# Patient Record
Sex: Male | Born: 1945 | Race: White | Hispanic: No | Marital: Single | Smoking: Current every day smoker
Health system: Southern US, Community
[De-identification: ages and names within clinical notes are randomized; demographics above are authoritative.]

## PROBLEM LIST (undated history)

## (undated) DIAGNOSIS — I2510 Atherosclerotic heart disease of native coronary artery without angina pectoris: Secondary | ICD-10-CM

## (undated) DIAGNOSIS — I5022 Chronic systolic (congestive) heart failure: Secondary | ICD-10-CM

## (undated) DIAGNOSIS — E785 Hyperlipidemia, unspecified: Secondary | ICD-10-CM

## (undated) DIAGNOSIS — K5792 Diverticulitis of intestine, part unspecified, without perforation or abscess without bleeding: Secondary | ICD-10-CM

## (undated) DIAGNOSIS — I8392 Asymptomatic varicose veins of left lower extremity: Secondary | ICD-10-CM

## (undated) DIAGNOSIS — J449 Chronic obstructive pulmonary disease, unspecified: Secondary | ICD-10-CM

## (undated) DIAGNOSIS — C679 Malignant neoplasm of bladder, unspecified: Secondary | ICD-10-CM

## (undated) DIAGNOSIS — I2699 Other pulmonary embolism without acute cor pulmonale: Secondary | ICD-10-CM

## (undated) DIAGNOSIS — D696 Thrombocytopenia, unspecified: Secondary | ICD-10-CM

## (undated) DIAGNOSIS — Z720 Tobacco use: Secondary | ICD-10-CM

## (undated) DIAGNOSIS — I10 Essential (primary) hypertension: Secondary | ICD-10-CM

## (undated) DIAGNOSIS — G43909 Migraine, unspecified, not intractable, without status migrainosus: Secondary | ICD-10-CM

## (undated) DIAGNOSIS — F32A Depression, unspecified: Secondary | ICD-10-CM

## (undated) DIAGNOSIS — I219 Acute myocardial infarction, unspecified: Secondary | ICD-10-CM

## (undated) DIAGNOSIS — I255 Ischemic cardiomyopathy: Secondary | ICD-10-CM

## (undated) DIAGNOSIS — Z95810 Presence of automatic (implantable) cardiac defibrillator: Secondary | ICD-10-CM

## (undated) DIAGNOSIS — I82409 Acute embolism and thrombosis of unspecified deep veins of unspecified lower extremity: Secondary | ICD-10-CM

## (undated) HISTORY — PX: MOHS SURGERY: SUR867

## (undated) HISTORY — PX: PERCUTANEOUS CORONARY STENT INTERVENTION (PCI-S): SHX6016

## (undated) HISTORY — PX: CARDIAC DEFIBRILLATOR PLACEMENT: SHX171

## (undated) HISTORY — PX: VASECTOMY: SHX75

## (undated) HISTORY — PX: VASECTOMY REVERSAL: SHX243

---

## 2018-03-03 DIAGNOSIS — I2699 Other pulmonary embolism without acute cor pulmonale: Secondary | ICD-10-CM | POA: Insufficient documentation

## 2018-03-11 DIAGNOSIS — Z8551 Personal history of malignant neoplasm of bladder: Secondary | ICD-10-CM | POA: Insufficient documentation

## 2018-04-11 DIAGNOSIS — F17200 Nicotine dependence, unspecified, uncomplicated: Secondary | ICD-10-CM

## 2019-12-28 DIAGNOSIS — C4492 Squamous cell carcinoma of skin, unspecified: Secondary | ICD-10-CM

## 2019-12-28 HISTORY — DX: Squamous cell carcinoma of skin, unspecified: C44.92

## 2021-01-06 DIAGNOSIS — J441 Chronic obstructive pulmonary disease with (acute) exacerbation: Secondary | ICD-10-CM | POA: Diagnosis present

## 2021-01-06 DIAGNOSIS — J309 Allergic rhinitis, unspecified: Secondary | ICD-10-CM | POA: Insufficient documentation

## 2021-01-06 DIAGNOSIS — I5022 Chronic systolic (congestive) heart failure: Secondary | ICD-10-CM | POA: Diagnosis present

## 2021-01-06 DIAGNOSIS — I255 Ischemic cardiomyopathy: Secondary | ICD-10-CM | POA: Insufficient documentation

## 2021-01-06 DIAGNOSIS — E785 Hyperlipidemia, unspecified: Secondary | ICD-10-CM

## 2021-01-06 DIAGNOSIS — I2510 Atherosclerotic heart disease of native coronary artery without angina pectoris: Secondary | ICD-10-CM | POA: Diagnosis present

## 2021-01-06 DIAGNOSIS — I5023 Acute on chronic systolic (congestive) heart failure: Secondary | ICD-10-CM

## 2021-01-06 DIAGNOSIS — E119 Type 2 diabetes mellitus without complications: Secondary | ICD-10-CM | POA: Insufficient documentation

## 2021-01-06 DIAGNOSIS — I10 Essential (primary) hypertension: Secondary | ICD-10-CM

## 2021-10-12 ENCOUNTER — Emergency Department: Payer: Medicare Other

## 2021-10-12 ENCOUNTER — Inpatient Hospital Stay
Admission: EM | Admit: 2021-10-12 | Discharge: 2021-10-13 | DRG: 280 | Disposition: A | Discharge status: Other Acute Inpatient Hospital | Payer: Medicare Other | Attending: Internal Medicine | Admitting: Internal Medicine

## 2021-10-12 ENCOUNTER — Other Ambulatory Visit: Payer: Self-pay

## 2021-10-12 DIAGNOSIS — Z823 Family history of stroke: Secondary | ICD-10-CM

## 2021-10-12 DIAGNOSIS — I252 Old myocardial infarction: Secondary | ICD-10-CM

## 2021-10-12 DIAGNOSIS — Z20822 Contact with and (suspected) exposure to covid-19: Secondary | ICD-10-CM | POA: Diagnosis present

## 2021-10-12 DIAGNOSIS — F17210 Nicotine dependence, cigarettes, uncomplicated: Secondary | ICD-10-CM | POA: Diagnosis present

## 2021-10-12 DIAGNOSIS — J9601 Acute respiratory failure with hypoxia: Secondary | ICD-10-CM | POA: Diagnosis present

## 2021-10-12 DIAGNOSIS — K625 Hemorrhage of anus and rectum: Secondary | ICD-10-CM | POA: Diagnosis present

## 2021-10-12 DIAGNOSIS — Z8052 Family history of malignant neoplasm of bladder: Secondary | ICD-10-CM

## 2021-10-12 DIAGNOSIS — Z86718 Personal history of other venous thrombosis and embolism: Secondary | ICD-10-CM

## 2021-10-12 DIAGNOSIS — I110 Hypertensive heart disease with heart failure: Secondary | ICD-10-CM | POA: Diagnosis present

## 2021-10-12 DIAGNOSIS — Z8674 Personal history of sudden cardiac arrest: Secondary | ICD-10-CM

## 2021-10-12 DIAGNOSIS — I10 Essential (primary) hypertension: Secondary | ICD-10-CM

## 2021-10-12 DIAGNOSIS — Z6835 Body mass index (BMI) 35.0-35.9, adult: Secondary | ICD-10-CM

## 2021-10-12 DIAGNOSIS — I5023 Acute on chronic systolic (congestive) heart failure: Secondary | ICD-10-CM | POA: Diagnosis present

## 2021-10-12 DIAGNOSIS — Z9221 Personal history of antineoplastic chemotherapy: Secondary | ICD-10-CM

## 2021-10-12 DIAGNOSIS — Z7901 Long term (current) use of anticoagulants: Secondary | ICD-10-CM

## 2021-10-12 DIAGNOSIS — D751 Secondary polycythemia: Secondary | ICD-10-CM

## 2021-10-12 DIAGNOSIS — E669 Obesity, unspecified: Secondary | ICD-10-CM | POA: Diagnosis present

## 2021-10-12 DIAGNOSIS — Z95810 Presence of automatic (implantable) cardiac defibrillator: Secondary | ICD-10-CM

## 2021-10-12 DIAGNOSIS — J441 Chronic obstructive pulmonary disease with (acute) exacerbation: Secondary | ICD-10-CM | POA: Diagnosis present

## 2021-10-12 DIAGNOSIS — Z9981 Dependence on supplemental oxygen: Secondary | ICD-10-CM

## 2021-10-12 DIAGNOSIS — Z8249 Family history of ischemic heart disease and other diseases of the circulatory system: Secondary | ICD-10-CM

## 2021-10-12 DIAGNOSIS — Z8551 Personal history of malignant neoplasm of bladder: Secondary | ICD-10-CM

## 2021-10-12 DIAGNOSIS — R0602 Shortness of breath: Secondary | ICD-10-CM | POA: Diagnosis not present

## 2021-10-12 DIAGNOSIS — I214 Non-ST elevation (NSTEMI) myocardial infarction: Principal | ICD-10-CM | POA: Diagnosis present

## 2021-10-12 DIAGNOSIS — F17200 Nicotine dependence, unspecified, uncomplicated: Secondary | ICD-10-CM

## 2021-10-12 DIAGNOSIS — D696 Thrombocytopenia, unspecified: Secondary | ICD-10-CM | POA: Diagnosis present

## 2021-10-12 DIAGNOSIS — R739 Hyperglycemia, unspecified: Secondary | ICD-10-CM | POA: Diagnosis present

## 2021-10-12 DIAGNOSIS — Z923 Personal history of irradiation: Secondary | ICD-10-CM

## 2021-10-12 DIAGNOSIS — E785 Hyperlipidemia, unspecified: Secondary | ICD-10-CM | POA: Diagnosis present

## 2021-10-12 DIAGNOSIS — Z955 Presence of coronary angioplasty implant and graft: Secondary | ICD-10-CM

## 2021-10-12 DIAGNOSIS — Z86711 Personal history of pulmonary embolism: Secondary | ICD-10-CM | POA: Diagnosis present

## 2021-10-12 DIAGNOSIS — I5021 Acute systolic (congestive) heart failure: Secondary | ICD-10-CM

## 2021-10-12 DIAGNOSIS — I255 Ischemic cardiomyopathy: Secondary | ICD-10-CM | POA: Diagnosis present

## 2021-10-12 DIAGNOSIS — I2510 Atherosclerotic heart disease of native coronary artery without angina pectoris: Secondary | ICD-10-CM | POA: Diagnosis present

## 2021-10-12 HISTORY — DX: Presence of automatic (implantable) cardiac defibrillator: Z95.810

## 2021-10-12 HISTORY — DX: Chronic obstructive pulmonary disease, unspecified: J44.9

## 2021-10-12 HISTORY — DX: Tobacco use: Z72.0

## 2021-10-12 HISTORY — DX: Ischemic cardiomyopathy: I25.5

## 2021-10-12 HISTORY — DX: Acute myocardial infarction, unspecified: I21.9

## 2021-10-12 HISTORY — DX: Atherosclerotic heart disease of native coronary artery without angina pectoris: I25.10

## 2021-10-12 HISTORY — DX: Depression, unspecified: F32.A

## 2021-10-12 HISTORY — DX: Diverticulitis of intestine, part unspecified, without perforation or abscess without bleeding: K57.92

## 2021-10-12 HISTORY — DX: Hyperlipidemia, unspecified: E78.5

## 2021-10-12 HISTORY — DX: Malignant neoplasm of bladder, unspecified: C67.9

## 2021-10-12 HISTORY — DX: Other pulmonary embolism without acute cor pulmonale: I26.99

## 2021-10-12 HISTORY — DX: Essential (primary) hypertension: I10

## 2021-10-12 HISTORY — DX: Acute embolism and thrombosis of unspecified deep veins of unspecified lower extremity: I82.409

## 2021-10-12 HISTORY — DX: Asymptomatic varicose veins of left lower extremity: I83.92

## 2021-10-12 HISTORY — DX: Morbid (severe) obesity due to excess calories: E66.01

## 2021-10-12 HISTORY — DX: Chronic systolic (congestive) heart failure: I50.22

## 2021-10-12 HISTORY — DX: Thrombocytopenia, unspecified: D69.6

## 2021-10-12 HISTORY — DX: Migraine, unspecified, not intractable, without status migrainosus: G43.909

## 2021-10-12 LAB — CBC WITH DIFFERENTIAL/PLATELET
Abs Immature Granulocytes: 0.02 10*3/uL (ref 0.00–0.07)
Basophils Absolute: 0 10*3/uL (ref 0.0–0.1)
Basophils Relative: 0 %
Eosinophils Absolute: 0.1 10*3/uL (ref 0.0–0.5)
Eosinophils Relative: 1 %
HCT: 54.8 % — ABNORMAL HIGH (ref 39.0–52.0)
Hemoglobin: 18.5 g/dL — ABNORMAL HIGH (ref 13.0–17.0)
Immature Granulocytes: 0 %
Lymphocytes Relative: 12 %
Lymphs Abs: 1.1 10*3/uL (ref 0.7–4.0)
MCH: 31.1 pg (ref 26.0–34.0)
MCHC: 33.8 g/dL (ref 30.0–36.0)
MCV: 92.3 fL (ref 80.0–100.0)
Monocytes Absolute: 0.9 10*3/uL (ref 0.1–1.0)
Monocytes Relative: 10 %
Neutro Abs: 7 10*3/uL (ref 1.7–7.7)
Neutrophils Relative %: 77 %
Platelets: 110 10*3/uL — ABNORMAL LOW (ref 150–400)
RBC: 5.94 MIL/uL — ABNORMAL HIGH (ref 4.22–5.81)
RDW: 12.2 % (ref 11.5–15.5)
WBC: 9.1 10*3/uL (ref 4.0–10.5)
nRBC: 0 % (ref 0.0–0.2)

## 2021-10-12 LAB — PROTIME-INR
INR: 1.1 (ref 0.8–1.2)
Prothrombin Time: 14.4 seconds (ref 11.4–15.2)

## 2021-10-12 LAB — COMPREHENSIVE METABOLIC PANEL
ALT: 23 U/L (ref 0–44)
AST: 59 U/L — ABNORMAL HIGH (ref 15–41)
Albumin: 3.6 g/dL (ref 3.5–5.0)
Alkaline Phosphatase: 76 U/L (ref 38–126)
Anion gap: 8 (ref 5–15)
BUN: 15 mg/dL (ref 8–23)
CO2: 30 mmol/L (ref 22–32)
Calcium: 9.3 mg/dL (ref 8.9–10.3)
Chloride: 101 mmol/L (ref 98–111)
Creatinine, Ser: 1.09 mg/dL (ref 0.61–1.24)
GFR, Estimated: >60 mL/min (ref >60)
Glucose, Bld: 190 mg/dL — ABNORMAL HIGH (ref 70–99)
Potassium: 3.5 mmol/L (ref 3.5–5.1)
Sodium: 139 mmol/L (ref 135–145)
Total Bilirubin: 2.7 mg/dL — ABNORMAL HIGH (ref 0.3–1.2)
Total Protein: 7.2 g/dL (ref 6.5–8.1)

## 2021-10-12 LAB — TROPONIN I (HIGH SENSITIVITY)
Troponin I (High Sensitivity): 5778 ng/L (ref <18)
Troponin I (High Sensitivity): 5795 ng/L (ref <18)

## 2021-10-12 LAB — HEPARIN LEVEL (UNFRACTIONATED): Heparin Unfractionated: 1.09 IU/mL — ABNORMAL HIGH (ref 0.30–0.70)

## 2021-10-12 LAB — RESP PANEL BY RT-PCR (FLU A&B, COVID) ARPGX2
Influenza A by PCR: NEGATIVE
Influenza B by PCR: NEGATIVE
SARS Coronavirus 2 by RT PCR: NEGATIVE

## 2021-10-12 LAB — BRAIN NATRIURETIC PEPTIDE: B Natriuretic Peptide: 431.7 pg/mL — ABNORMAL HIGH (ref 0.0–100.0)

## 2021-10-12 LAB — MAGNESIUM: Magnesium: 1.9 mg/dL (ref 1.7–2.4)

## 2021-10-12 LAB — APTT: aPTT: 27 seconds (ref 24–36)

## 2021-10-12 MED ORDER — MORPHINE SULFATE (PF) 2 MG/ML IV SOLN: 2.0000 mg | INTRAVENOUS | Status: DC | PRN

## 2021-10-12 MED ORDER — VITAMIN B-12 1000 MCG PO TABS
2500.0000 ug | ORAL_TABLET | Freq: Every day | ORAL | Status: DC
  Filled 2021-10-12: qty 2.5

## 2021-10-12 MED ORDER — LISINOPRIL 10 MG PO TABS
10.0000 mg | ORAL_TABLET | Freq: Every day | ORAL | Status: DC
  Administered 2021-10-13: 10 mg via ORAL
  Filled 2021-10-12: qty 1

## 2021-10-12 MED ORDER — METHYLPREDNISOLONE SODIUM SUCC 40 MG IJ SOLR: 40.0000 mg | Freq: Every day | INTRAMUSCULAR | Status: DC

## 2021-10-12 MED ORDER — FLUTICASONE FUROATE-VILANTEROL 100-25 MCG/ACT IN AEPB
1.0000 | INHALATION_SPRAY | Freq: Every morning | RESPIRATORY_TRACT | Status: DC
  Filled 2021-10-12: qty 28

## 2021-10-12 MED ORDER — METHYLPREDNISOLONE SODIUM SUCC 125 MG IJ SOLR
125.0000 mg | Freq: Once | INTRAMUSCULAR | Status: AC
  Administered 2021-10-12: 17:00:00 125 mg via INTRAVENOUS
  Filled 2021-10-12: qty 2

## 2021-10-12 MED ORDER — IPRATROPIUM-ALBUTEROL 0.5-2.5 (3) MG/3ML IN SOLN: 3.0000 mL | Freq: Three times a day (TID) | RESPIRATORY_TRACT | Status: DC

## 2021-10-12 MED ORDER — ASPIRIN 81 MG PO CHEW
324.0000 mg | CHEWABLE_TABLET | Freq: Once | ORAL | Status: AC
  Administered 2021-10-12: 17:00:00 324 mg via ORAL
  Filled 2021-10-12: qty 4

## 2021-10-12 MED ORDER — ONDANSETRON HCL 4 MG PO TABS: 4.0000 mg | ORAL_TABLET | Freq: Four times a day (QID) | ORAL | Status: DC | PRN

## 2021-10-12 MED ORDER — ENOXAPARIN SODIUM 60 MG/0.6ML IJ SOSY: 0.5000 mg/kg | PREFILLED_SYRINGE | INTRAMUSCULAR | Status: DC

## 2021-10-12 MED ORDER — APIXABAN 5 MG PO TABS: 5.0000 mg | ORAL_TABLET | Freq: Two times a day (BID) | ORAL | Status: DC

## 2021-10-12 MED ORDER — NITROGLYCERIN 0.4 MG SL SUBL
0.4000 mg | SUBLINGUAL_TABLET | SUBLINGUAL | Status: DC | PRN
  Administered 2021-10-12: 18:00:00 0.4 mg via SUBLINGUAL
  Filled 2021-10-12: qty 1

## 2021-10-12 MED ORDER — ACETAMINOPHEN 325 MG RE SUPP: 650.0000 mg | Freq: Four times a day (QID) | RECTAL | Status: DC | PRN

## 2021-10-12 MED ORDER — IPRATROPIUM-ALBUTEROL 0.5-2.5 (3) MG/3ML IN SOLN
3.0000 mL | Freq: Three times a day (TID) | RESPIRATORY_TRACT | Status: DC
  Administered 2021-10-12: 22:00:00 3 mL via RESPIRATORY_TRACT
  Filled 2021-10-12 (×2): qty 3

## 2021-10-12 MED ORDER — IPRATROPIUM-ALBUTEROL 0.5-2.5 (3) MG/3ML IN SOLN
3.0000 mL | Freq: Once | RESPIRATORY_TRACT | Status: AC
  Administered 2021-10-12: 17:00:00 3 mL via RESPIRATORY_TRACT
  Filled 2021-10-12: qty 3

## 2021-10-12 MED ORDER — ADULT MULTIVITAMIN W/MINERALS CH
1.0000 | ORAL_TABLET | Freq: Every day | ORAL | Status: DC
  Administered 2021-10-13: 1 via ORAL
  Filled 2021-10-12: qty 1

## 2021-10-12 MED ORDER — HEPARIN (PORCINE) 25000 UT/250ML-% IV SOLN
1550.0000 [IU]/h | INTRAVENOUS | Status: DC
  Administered 2021-10-12: 21:00:00 1300 [IU]/h via INTRAVENOUS
  Filled 2021-10-12 (×2): qty 250

## 2021-10-12 MED ORDER — PRAVASTATIN SODIUM 20 MG PO TABS: 80.0000 mg | ORAL_TABLET | Freq: Every day | ORAL | Status: DC

## 2021-10-12 MED ORDER — FLUTICASONE PROPIONATE 50 MCG/ACT NA SUSP
2.0000 | Freq: Every day | NASAL | Status: DC | PRN
  Filled 2021-10-12: qty 16

## 2021-10-12 MED ORDER — ALBUTEROL SULFATE (2.5 MG/3ML) 0.083% IN NEBU: 2.5000 mg | INHALATION_SOLUTION | Freq: Four times a day (QID) | RESPIRATORY_TRACT | Status: DC | PRN

## 2021-10-12 MED ORDER — VITAMIN D3 25 MCG (1000 UNIT) PO TABS
5000.0000 [IU] | ORAL_TABLET | Freq: Every day | ORAL | Status: DC
  Filled 2021-10-12 (×2): qty 5

## 2021-10-12 MED ORDER — POLYETHYLENE GLYCOL 3350 17 G PO PACK: 17.0000 g | PACK | Freq: Every day | ORAL | Status: DC | PRN

## 2021-10-12 MED ORDER — ONDANSETRON HCL 4 MG/2ML IJ SOLN: 4.0000 mg | Freq: Four times a day (QID) | INTRAMUSCULAR | Status: DC | PRN

## 2021-10-12 MED ORDER — CARVEDILOL 6.25 MG PO TABS
12.5000 mg | ORAL_TABLET | Freq: Two times a day (BID) | ORAL | Status: DC
  Administered 2021-10-12 – 2021-10-13 (×2): 12.5 mg via ORAL
  Filled 2021-10-12 (×2): qty 2

## 2021-10-12 MED ORDER — ACETAMINOPHEN 325 MG PO TABS: 650.0000 mg | ORAL_TABLET | Freq: Four times a day (QID) | ORAL | Status: DC | PRN

## 2021-10-12 MED ORDER — HEPARIN BOLUS VIA INFUSION
4000.0000 [IU] | Freq: Once | INTRAVENOUS | Status: AC
  Administered 2021-10-12: 21:00:00 4000 [IU] via INTRAVENOUS
  Filled 2021-10-12: qty 4000

## 2021-10-12 NOTE — Assessment & Plan Note (Signed)
-   Check high sensitive troponin ?

## 2021-10-12 NOTE — Consult Note (Addendum)
ANTICOAGULATION CONSULT NOTE - Initial Consult ? ?Pharmacy Consult for heparin ?Indication: chest pain/ACS ? ?No Known Allergies ? ?Patient Measurements: ?Height: '5\' 10"'$  (177.8 cm) ?Weight: 112 kg (247 lb) ?IBW/kg (Calculated) : 73 ?Heparin Dosing Weight: 97.2 kg ? ?Vital Signs: ?Temp: 97.9 ?F (36.6 ?C) (03/09 1642) ?Temp Source: Oral (03/09 1642) ?BP: 109/69 (03/09 1900) ?Pulse Rate: 83 (03/09 1900) ? ?Labs: ?Recent Labs  ?  10/12/21 ?1707 10/12/21 ?1927  ?HGB 18.5*  --   ?HCT 54.8*  --   ?PLT 110*  --   ?CREATININE 1.09  --   ?TROPONINIHS  --  5,778*  ? ? ?Estimated Creatinine Clearance: 75.6 mL/min (by C-G formula based on SCr of 1.09 mg/dL). ? ? ?Medical History: ?Past Medical History:  ?Diagnosis Date  ? CHF (congestive heart failure) ([place])   ? COPD (chronic obstructive pulmonary disease) ([place])   ? MI (myocardial infarction) ([place])   ? ? ?Medications:  ?(Not in a hospital admission)  ?Scheduled:  ? carvedilol  12.5 mg Oral BID  ? [START ON 10/13/2021] cholecalciferol  5,000 Units Oral Daily  ? [START ON 10/13/2021] fluticasone furoate-vilanterol  1 puff Inhalation q AM  ? ipratropium-albuterol  3 mL Nebulization TID  ? [START ON 10/13/2021] lisinopril  10 mg Oral Daily  ? [START ON 10/13/2021] methylPREDNISolone (SOLU-MEDROL) injection  40 mg Intravenous Daily  ? [START ON 10/13/2021] multivitamin with minerals  1 tablet Oral Daily  ? [START ON 10/13/2021] pravastatin  80 mg Oral q1800  ? [START ON 10/13/2021] vitamin B-12  2,500 mcg Oral Daily  ? ?Infusions:  ?PRN: acetaminophen **OR** acetaminophen, albuterol, fluticasone, morphine injection, nitroGLYCERIN, ondansetron **OR** ondansetron (ZOFRAN) IV, polyethylene glycol ?Anti-infectives (From admission, onward)  ? ? None  ? ?  ? ? ?Assessment: ?Pharmacy consulted to start heparin for ACS. Trop elevated 5.7K. Pt takes apixaban PTA, unsure of the indication. Baseline labs ordered. Will use aPTT to monitor heparin if anti-xa level is falsely elevated. Last dose of  apixaban was 10/12/21 AM ? ?Goal of Therapy:  ?aPTT 66-102 seconds ?Heparin level 0.3-0.7 units/ml once aPTT and heparin level correlate.  ?Monitor platelets by anticoagulation protocol: Yes ?  ?Plan:  ?Give 4000 units bolus x 1 ?Start heparin infusion at 1300 units/hr ?Check aPTT level in 8 hours and heparin level daily while on heparin.  ?Continue to monitor H&H and platelets ? ?Oswald Hillock, PharmD, BCPS ?10/12/2021,8:30 PM ? ? ?

## 2021-10-12 NOTE — ED Notes (Signed)
Pt reports feeling a little better after nitro. Pt now rates his pain 3/10. ?

## 2021-10-12 NOTE — Assessment & Plan Note (Addendum)
-   Status post PCI in 2002 (he states that he had an MI, coded 2 times, was sedated and intubated and had stents placed) ?- He has AICD placement in [place], he was not able to recall the year but thinks it was greater than 5 years ago ?- He had an echo done in [place] state in November 2022, he states that he will try to obtain the result and reading ?- I have not ordered complete echo at this time due to patient stating he had a complete echo in November 2022 ?-Resumed home carvedilol 12.5 mg p.o. twice daily, lisinopril 10 mg daily, cholesterol medication alternative with pravastatin 80 mg nightly ?

## 2021-10-12 NOTE — ED Provider Notes (Signed)
? ?[facility] ?Provider Note ? ? Event Date/Time  ? First MD Initiated Contact with Patient 10/12/21 1643   ?  (approximate) ?History  ?Shortness of Breath ? ?HPI ?Paul Bullock is a 76 y.o. male with a stated past medical history of COPD, CHF, ACS who presents for chest tightness with associated shortness of breath that is worsening over the last week and significantly worsening over the last 30 minutes.  Patient describes this chest pain as 7/10, tight, and radiating from the left side of his chest to the right.  Patient states that similar symptoms have happened to him when he has had COPD exacerbations in the past.  Patient states that he is not using oxygen chronically but has needed 3 L of oxygen since triage ?Physical Exam  ?Triage Vital Signs: ?ED Triage Vitals  ?Enc Vitals Group  ?   BP 10/12/21 1642 (!) 159/85  ?   Pulse Rate 10/12/21 1638 (!) 107  ?   Resp 10/12/21 1638 (!) 26  ?   Temp 10/12/21 1642 97.9 ?F (36.6 ?C)  ?   Temp Source 10/12/21 1642 Oral  ?   SpO2 10/12/21 1638 94 %  ?   Weight 10/12/21 1639 247 lb (112 kg)  ?   Height 10/12/21 1639 '5\' 10"'$  (1.778 m)  ?   Head Circumference --   ?   Peak Flow --   ?   Pain Score 10/12/21 1638 7  ?   Pain Loc --   ?   Pain Edu? --   ?   Excl. in [place]? --   ? ?Most recent vital signs: ?Vitals:  ? 10/12/21 1638 10/12/21 1642  ?BP:  (!) 159/85  ?Pulse: (!) 107   ?Resp: (!) 26   ?Temp:  97.9 ?F (36.6 ?C)  ?SpO2: 94%   ? ?General: Awake, oriented x4. ?CV:  Good peripheral perfusion.  ?Resp:  Increased Effort.  Normal expiratory wheezes over bilateral lung fields.  ?Abd:  No distention.  ?Other:  Caucasian overweight male laying in bed in moderate respiratory distress ?ED Results / Procedures / Treatments  ?Labs ?(all labs ordered are listed, but only abnormal results are displayed) ?Labs Reviewed  ?COMPREHENSIVE METABOLIC PANEL - Abnormal; Notable for the following components:  ?    Result Value  ? Glucose, Bld 190 (*)   ? AST 59 (*)   ? Total  Bilirubin 2.7 (*)   ? All other components within normal limits  ?BRAIN NATRIURETIC PEPTIDE - Abnormal; Notable for the following components:  ? B Natriuretic Peptide 431.7 (*)   ? All other components within normal limits  ?CBC WITH DIFFERENTIAL/PLATELET - Abnormal; Notable for the following components:  ? RBC 5.94 (*)   ? Hemoglobin 18.5 (*)   ? HCT 54.8 (*)   ? Platelets 110 (*)   ? All other components within normal limits  ?RESP PANEL BY RT-PCR (FLU A&B, COVID) [id number]  ?BASIC METABOLIC PANEL  ?CBC  ?MAGNESIUM  ?TROPONIN I (HIGH SENSITIVITY)  ? ?EKG ?ED ECG REPORT ?I, Naaman Plummer, the attending physician, personally viewed and interpreted this ECG. ?Date: 10/12/2021 ?EKG Time: 1802 ?Rate: 82 ?Rhythm: normal sinus rhythm ?QRS Axis: normal ?Intervals: normal ?ST/T Wave abnormalities: normal ?Narrative Interpretation: no evidence of acute ischemia ?RADIOLOGY ?ED MD interpretation: Single view portable chest x-ray as interpreted by me shows diffuse peribronchial thickening and interstitial coarsening ?-Agree with radiology assessment ?Official radiology report(s): ?DG Chest Port 1 View ? ?Result Date: 10/12/2021 ?CLINICAL  DATA:  Shortness of breath.  History of COPD. EXAM: PORTABLE CHEST 1 VIEW COMPARISON:  None available. FINDINGS: Dual lead left-sided pacemaker in place. The heart is enlarged. Aortic atherosclerosis. There is diffuse peribronchial thickening and interstitial coarsening. Limited left basilar assessment due to soft tissue attenuation from habitus. No confluent airspace disease. No large pleural effusion. No pneumothorax. IMPRESSION: Cardiomegaly. Diffuse peribronchial thickening and interstitial coarsening, may represent pulmonary edema, acute bronchitis, or chronic lung disease. No prior exams available for comparison. Aortic Atherosclerosis (ICD10-I70.0). Electronically Signed   By: Keith Rake M.D.   On: 10/12/2021 17:39   ?PROCEDURES: ?Critical Care performed: Yes, see critical care  procedure note(s) ?.1-3 Lead EKG Interpretation ?Performed by: Naaman Plummer, MD ?Authorized by: Naaman Plummer, MD  ? ?  Interpretation: abnormal   ?  ECG rate:  108 ?  ECG rate assessment: tachycardic   ?  Rhythm: sinus tachycardia   ?  Ectopy: none   ?  Conduction: normal   ?CRITICAL CARE ?Performed by: Naaman Plummer ? ? ?Total critical care time: 35 minutes ? ?Critical care time was exclusive of separately billable procedures and treating other patients. ? ?Critical care was necessary to treat or prevent imminent or life-threatening deterioration. ? ?Critical care was time spent personally by me on the following activities: development of treatment plan with patient and/or surrogate as well as nursing, discussions with consultants, evaluation of patient's response to treatment, examination of patient, obtaining history from patient or surrogate, ordering and performing treatments and interventions, ordering and review of laboratory studies, ordering and review of radiographic studies, pulse oximetry and re-evaluation of patient's condition. ? ?MEDICATIONS ORDERED IN ED: ?Medications  ?nitroGLYCERIN (NITROSTAT) SL tablet 0.4 mg (0.4 mg Sublingual Given 10/12/21 1806)  ?acetaminophen (TYLENOL) tablet 650 mg (has no administration in time range)  ?  Or  ?acetaminophen (TYLENOL) suppository 650 mg (has no administration in time range)  ?ondansetron (ZOFRAN) tablet 4 mg (has no administration in time range)  ?  Or  ?ondansetron (ZOFRAN) injection 4 mg (has no administration in time range)  ?enoxaparin (LOVENOX) injection 55 mg (has no administration in time range)  ?polyethylene glycol (MIRALAX / GLYCOLAX) packet 17 g (has no administration in time range)  ?ipratropium-albuterol (DUONEB) 0.5-2.5 (3) MG/3ML nebulizer solution 3 mL (has no administration in time range)  ?methylPREDNISolone sodium succinate (SOLU-MEDROL) 40 mg/mL injection 40 mg (has no administration in time range)  ?morphine (PF) 2 MG/ML injection 2  mg (has no administration in time range)  ?ipratropium-albuterol (DUONEB) 0.5-2.5 (3) MG/3ML nebulizer solution 3 mL (3 mLs Nebulization Given 10/12/21 1716)  ?methylPREDNISolone sodium succinate (SOLU-MEDROL) 125 mg/2 mL injection 125 mg (125 mg Intravenous Given 10/12/21 1713)  ?aspirin chewable tablet 324 mg (324 mg Oral Given 10/12/21 1711)  ? ?IMPRESSION / MDM / ASSESSMENT AND PLAN / ED COURSE  ?I reviewed the triage vital signs and the nursing notes. ?             ?               ?The patient is on the cardiac monitor to evaluate for evidence of arrhythmia and/or significant heart rate changes. ?The patient appears to be suffering from a moderate/severe exacerbation of COPD. ? ?Based on the history, exam, CXR/EKG reviewed by me, and further workup I don?t suspect any other emergent cause of this presentation, such as pneumonia, acute coronary syndrome, pulmonary embolism, or pneumothorax.  Patient may have small element of pulmonary edema however clinically  patient does not show signs of volume overload including no significant lower extremity edema or rales pulmonary exam ? ?ED Interventions: bronchodilators, steroids, reassess ? ?Reassessment: After treatment, the patient?s shortness of breath is improving but patient is still requiring supplemental oxygenation ? ?Disposition: Admit ? ?  ?FINAL CLINICAL IMPRESSION(S) / ED DIAGNOSES  ? ?Final diagnoses:  ?COPD exacerbation ([place])  ?SOB (shortness of breath)  ?Acute respiratory failure with hypoxia ([place])  ? ?Rx / DC Orders  ? ?ED Discharge Orders   ? ? None  ? ?  ? ?Note:  This document was prepared using Dragon voice recognition software and may include unintentional dictation errors. ?  ?Naaman Plummer, MD ?10/12/21 1920 ? ?

## 2021-10-12 NOTE — Assessment & Plan Note (Signed)
-   Patient has history of multiple PE ?- Patient is compliant with Eliquis ?- Eliquis has been held at this time due to NSTEMI ?

## 2021-10-12 NOTE — Assessment & Plan Note (Signed)
-   Patient had several episodes of rectal bleeding that bled into his pants ?- It has resolved at this time ?- Continue to monitor ?- We will continue with heparin GGT for NSTEMI in a patient with extensive history of coronary artery disease ?

## 2021-10-12 NOTE — H&P (Addendum)
History and Physical   Paul Bullock YPP:[id number] DOB: 02-14-1946 DOA: 10/12/2021  PCP: Pcp, No  Outpatient Specialists: None in [place], patient is currently looking for specialist to establish care Patient coming from: Home  I have personally briefly reviewed patient's old medical records in [place].  Chief Concern: Shortness of breath and chest pain  HPI: Mr. 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  Initial vitals in the emergency department showed temperature of 97.9, respiration rate of 26, heart rate of 107, blood pressure 159/85, SPO2 of 94% on room air.  Serum sodium 139, potassium 3.5, chloride 101, bicarb 30, BUN of 15, serum creatinine of 1.09, GFR greater than 60, nonfasting blood glucose 190, WBC 9.1, hemoglobin 18.5, platelets of 110.  BNP was 431.7.  COVID/influenza A/influenza B PCR were negative.  ED treatment: DuoNebs one-time dose, Solu-Medrol 125 mg, aspirin 324, nitroglycerin sublingual. ---------- At bedside, he is able to tell me his name, age, current calendar year.   He developed chest pressure, 7-8/10, like someone squeezing his chest, after leaving a restaurant. He had prime rib and rice pilaf and a side salad. He states that this felt simlar to prior heart attack 2002, with stents in his hear. 2013 he was diagnosed with bladder cancer. Currently the chest pressure is a 1/10 and barely preceived. He felt tired and lethagic.  He reports the chest pressure is similar to prior episodes of his heart attack in 2002.  He also states that he had AICD placed in [place], more than 5 years ago.  He does not remember the specific year as he moves around a lot.  He endorses compliance with Eliquis and his heart  medications.  Social history: He lives by himself at home. He smokes, less than one pack per day. He denies etoh and recreational drugs.  He is currently retired and formerly worked for the [employer] as a Engineer, drilling.  ROS: Constitutional: no weight change, no fever ENT/Mouth: no sore throat, no rhinorrhea Eyes: no eye pain, no vision changes Cardiovascular: + chest pain, + dyspnea,  no edema, no palpitations Respiratory: no cough, no sputum, no wheezing Gastrointestinal: no nausea, no vomiting, no diarrhea, no constipation Genitourinary: no urinary incontinence, no dysuria, no hematuria Musculoskeletal: no arthralgias, no myalgias Skin: no skin lesions, no pruritus, Neuro: no weakness, no loss of consciousness, no syncope Psych: no anxiety, no depression, + decrease appetite Heme/Lymph: no bruising, no bleeding  ED Course: Discussed with emergency medicine provider, patient requiring hospitalization for chief concerns of shortness of breath.  Assessment/Plan  Principal Problem:   Shortness of breath Active Problems:   COPD with acute exacerbation (HCC)   Elevated brain natriuretic peptide (BNP) level   NSTEMI (non-ST elevated myocardial infarction) (HCC)   CAD (coronary artery disease)   Chest pain   History of pulmonary embolus (PE)   Rectal bleeding   Cardiovascular and Mediastinum * Shortness of breath Assessment & Plan - Etiology work-up in progress differentials include heart failure exacerbation versus NSTEMI, low clinical suspicion for COPD at this time - Patient also endorsed improving shortness of breath after nitroglycerin sublingual - Strict I's and O's - Flutter valve and incentive spirometry  CAD (coronary artery disease) Assessment & Plan - Status post PCI in 2002 (he  states that he had an MI, coded 2 times, was sedated and intubated and had stents placed) - He has AICD placement in [place], he was not able to recall the year but thinks it was  greater than 5 years ago - He had an echo done in [place] state in November 2022, he states that he will try to obtain the result and reading - I have not ordered complete echo at this time due to patient stating he had a complete echo in November 2022 -Resumed home carvedilol 12.5 mg p.o. twice daily, lisinopril 10 mg daily, cholesterol medication alternative with pravastatin 80 mg nightly  NSTEMI (non-ST elevated myocardial infarction) [facility]) Assessment & Plan - Ordered heparin GGT - We will follow second high-sensitivity troponin - Cardiologist, Dr. Curt Bears has been consulted and states he will see the patient  Respiratory COPD with acute exacerbation ([place]) Assessment & Plan - DuoNebs 3 times daily scheduled, 3 doses ordered - Solu-Medrol 40 mg IV 1 dose ordered for 10/13/2021 - Incentive spirometry and flutter valve  Other History of pulmonary embolus (PE) Assessment & Plan - Patient has history of multiple PE - Patient is compliant with Eliquis - Eliquis has been held at this time due to NSTEMI  Chest pain Assessment & Plan - Relieved with nitroglycerin - Given patient's extensive history of cardiac disease, high sensitive troponin has been ordered - Nitroglycerin as needed ordered - If patient has recurrence of chest pain, we will order nitroglycerin gtt. if patient's blood pressure can tolerate  Elevated brain natriuretic peptide (BNP) level Assessment & Plan - Check high sensitive troponin  Rectal bleeding - Patient had several episodes of rectal bleeding that bled into his pants - Hemoglobin on admission is 18.5, repeat CBC in the a.m. - It has resolved at this time - Continue to monitor - We will continue with heparin GGT for NSTEMI in a patient with history of coronary artery disease  Chart reviewed.   DVT prophylaxis: Heparin GTT Code Status: Full code Diet: Heart healthy now, n.p.o. after midnight Family Communication:  Disposition Plan: Pending clinical  course, and cardiology evaluation Consults called: Cardiologist Admission status: Progressive cardiac, observation  Past Medical History:  Diagnosis Date   CHF (congestive heart failure) ([place])    COPD (chronic obstructive pulmonary disease) ([place])    MI (myocardial infarction) ([place])    Past Surgical History:  Procedure Laterality Date   PERCUTANEOUS CORONARY STENT INTERVENTION (PCI-S)     Social History:  reports that he has been smoking cigarettes. He has never used smokeless tobacco. He reports that he does not drink alcohol and does not use drugs.  No Known Allergies Family History  Problem Relation Age of Onset   Stroke Mother    Bladder Cancer Father    Heart attack Brother    Family history: Family history reviewed and pertinent for heart disease/heart attack in his brother  Prior to Admission medications   Carvedilol 12.5 mg p.o. twice daily, apixaban 5 mg twice daily, lisinopril 10 mg daily, pitavastatin 4 mg qhs   Physical Exam: Vitals:   10/12/21 1639 10/12/21 1642 10/12/21 1900 10/12/21 2114  BP:  (!) 159/85 109/69 122/77  Pulse:   83 91  Resp:   (!) 25   Temp:  97.9 F (36.6 C)    TempSrc:  Oral    SpO2:   96%   Weight: 112 kg     Height: '5\' 10"'$  (1.778 m)      Constitutional: appears age-appropriate, NAD,  calm, comfortable Eyes: PERRL, lids and conjunctivae normal ENMT: Mucous membranes are moist. Posterior pharynx clear of any exudate or lesions. Age-appropriate dentition. Hearing appropriate Neck: normal, supple, no masses, no thyromegaly Respiratory: clear to auscultation bilaterally, no wheezing, no crackles. Normal respiratory effort. No accessory muscle use.  Cardiovascular: Regular rate and rhythm, no murmurs / rubs / gallops. No extremity edema. 2+ pedal pulses. No carotid bruits.  Abdomen: no tenderness, no masses palpated, no hepatosplenomegaly. Bowel sounds positive.  Musculoskeletal: no clubbing / cyanosis. No joint deformity upper and lower  extremities. Good ROM, no contractures, no atrophy. Normal muscle tone.  Skin: no rashes, lesions, ulcers. No induration Neurologic: Sensation intact. Strength 5/5 in all 4.  Psychiatric: Normal judgment and insight. Alert and oriented x 3. Normal mood.   EKG: independently reviewed, showing sinus rhythm with rate of 82, QTc 483  Chest x-ray on Admission: I personally reviewed and I agree with radiologist reading as below.  DG Chest Port 1 View  Result Date: 10/12/2021 CLINICAL DATA:  Shortness of breath.  History of COPD. EXAM: PORTABLE CHEST 1 VIEW COMPARISON:  None available. FINDINGS: Dual lead left-sided pacemaker in place. The heart is enlarged. Aortic atherosclerosis. There is diffuse peribronchial thickening and interstitial coarsening. Limited left basilar assessment due to soft tissue attenuation from habitus. No confluent airspace disease. No large pleural effusion. No pneumothorax. IMPRESSION: Cardiomegaly. Diffuse peribronchial thickening and interstitial coarsening, may represent pulmonary edema, acute bronchitis, or chronic lung disease. No prior exams available for comparison. Aortic Atherosclerosis (ICD10-I70.0). Electronically Signed   By: Keith Rake M.D.   On: 10/12/2021 17:39    Labs on Admission: I have personally reviewed following labs  CBC: Recent Labs  Lab 10/12/21 1707  WBC 9.1  NEUTROABS 7.0  HGB 18.5*  HCT 54.8*  MCV 92.3  PLT 778*   Basic Metabolic Panel: Recent Labs  Lab 10/12/21 1707 10/12/21 1927  NA 139  --   K 3.5  --   CL 101  --   CO2 30  --   GLUCOSE 190*  --   BUN 15  --   CREATININE 1.09  --   CALCIUM 9.3  --   MG  --  1.9   GFR: Estimated Creatinine Clearance: 75.6 mL/min (by C-G formula based on SCr of 1.09 mg/dL).  Liver Function Tests: Recent Labs  Lab 10/12/21 1707  AST 59*  ALT 23  ALKPHOS 76  BILITOT 2.7*  PROT 7.2  ALBUMIN 3.6   CRITICAL CARE Performed by: Criss Alvine  Total critical care time: 35  minutes  Critical care time was exclusive of separately billable procedures and treating other patients.  Critical care was necessary to treat or prevent imminent or life-threatening deterioration.  Critical care was time spent personally by me on the following activities: development of treatment plan with patient and/or surrogate as well as nursing, discussions with consultants, evaluation of patient's response to treatment, examination of patient, obtaining history from patient or surrogate, ordering and performing treatments and interventions, ordering and review of laboratory studies, ordering and review of radiographic studies, pulse oximetry and re-evaluation of patient's condition.  Dr. Tobie Poet [facility]  If 7PM-7AM, please contact overnight-coverage provider If 7AM-7PM, please contact day coverage provider [email or web address]  10/12/2021, 9:39 PM

## 2021-10-12 NOTE — Hospital Course (Addendum)
Mr. Maahir Horst is a 76 year old male with COPD, history of multiple PEs currently on Eliquis, CAD status post PCI in 2002, status post AICD placed in [place], hypertension, hyperlipidemia, who presents to the emergency department for chief concerns of shortness of breath. ? ?Initial vitals in the emergency department showed temperature of 97.9, respiration rate of 26, heart rate of 107, blood pressure 159/85, SPO2 of 94% on room air. ? ?Serum sodium 139, potassium 3.5, chloride 101, bicarb 30, BUN of 15, serum creatinine of 1.09, GFR greater than 60, nonfasting blood glucose 190, WBC 9.1, hemoglobin 18.5, platelets of 110. ? ?BNP was 431.7.  COVID/influenza A/influenza B PCR were negative. ? ?ED treatment: DuoNebs one-time dose, Solu-Medrol 125 mg, aspirin 324, nitroglycerin sublingual. ?

## 2021-10-12 NOTE — ED Notes (Signed)
Pt c/o increased chest discomfort. Repeat EKG done and EDP [place] notified ?

## 2021-10-12 NOTE — ED Notes (Signed)
Dr. Tobie Poet notified on troponin. See orders for details ? ?

## 2021-10-12 NOTE — ED Notes (Signed)
Pt to ED for SOB and chest discomfort that started a couple days ago. Pt states the CP wraps around chest. Pt has hx of CHF, and ICD ? ?Pt is A&Ox4.  ?

## 2021-10-12 NOTE — ED Triage Notes (Signed)
Pt arrives via POV with wife with increased work of breathing and CP- pt states the CP has been on and off for the last couple days but it got worse today- pt states that the pain is all the way across his chest and it feels like a pressure- pt states it is affecting his sleep- pt has an ICD ?

## 2021-10-12 NOTE — Assessment & Plan Note (Signed)
-   Relieved with nitroglycerin ?- Given patient's extensive history of cardiac disease, high sensitive troponin has been ordered ?- Nitroglycerin as needed ordered ?- If patient has recurrence of chest pain, we will order nitroglycerin gtt. if patient's blood pressure can tolerate ?

## 2021-10-12 NOTE — Assessment & Plan Note (Addendum)
-   The initials include heart failure exacerbation versus NSTEMI, low clinical suspicion for COPD at this time ?- Patient also endorsed improving shortness of breath after nitroglycerin sublingual ?- Strict I's and O's ?- Flutter valve and incentive spirometry ?

## 2021-10-12 NOTE — ED Notes (Signed)
Pt c/o increasing CP 8/10, repeat EKG done. EDP bradler notified. Order for [employer] placed. See mar for details ?

## 2021-10-12 NOTE — ED Notes (Signed)
Pt does state that he has hemorrhoids that have been bleeding enough to soak his underwear, pants, and get on the furniture- pt does take a blood thinner ?

## 2021-10-12 NOTE — Assessment & Plan Note (Addendum)
-   Ordered heparin GGT ?- We will follow second high-sensitivity troponin ?- Cardiologist, Dr. Curt Bears has been consulted and states he will see the patient ? ?

## 2021-10-12 NOTE — Assessment & Plan Note (Signed)
-   DuoNebs 3 times daily scheduled, 3 doses ordered ?- Solu-Medrol 40 mg IV 1 dose ordered for 10/13/2021 ?- Incentive spirometry and flutter valve ?

## 2021-10-13 ENCOUNTER — Encounter
Admission: EM | Disposition: A | Discharge status: Other Acute Inpatient Hospital | Payer: Self-pay | Source: Home / Self Care | Attending: Internal Medicine

## 2021-10-13 ENCOUNTER — Encounter: Payer: Self-pay | Admitting: Cardiology

## 2021-10-13 ENCOUNTER — Encounter: Payer: Self-pay | Admitting: Internal Medicine

## 2021-10-13 ENCOUNTER — Inpatient Hospital Stay (HOSPITAL_COMMUNITY)
Admit: 2021-10-13 | Discharge: 2021-10-13 | Disposition: A | Discharge status: Home / Self Care | Payer: Medicare Other | Attending: Cardiovascular Disease | Admitting: Cardiovascular Disease

## 2021-10-13 ENCOUNTER — Inpatient Hospital Stay (HOSPITAL_COMMUNITY)
Admission: AD | Admit: 2021-10-13 | Discharge: 2021-10-19 | DRG: 246 | Disposition: A | Discharge status: Home / Self Care | Payer: Medicare Other | Source: Other Acute Inpatient Hospital | Attending: Cardiology | Admitting: Cardiology

## 2021-10-13 ENCOUNTER — Inpatient Hospital Stay: Payer: Self-pay

## 2021-10-13 DIAGNOSIS — E119 Type 2 diabetes mellitus without complications: Secondary | ICD-10-CM | POA: Diagnosis present

## 2021-10-13 DIAGNOSIS — Z9221 Personal history of antineoplastic chemotherapy: Secondary | ICD-10-CM

## 2021-10-13 DIAGNOSIS — F17210 Nicotine dependence, cigarettes, uncomplicated: Secondary | ICD-10-CM | POA: Diagnosis present

## 2021-10-13 DIAGNOSIS — I252 Old myocardial infarction: Secondary | ICD-10-CM | POA: Diagnosis not present

## 2021-10-13 DIAGNOSIS — D6959 Other secondary thrombocytopenia: Secondary | ICD-10-CM | POA: Diagnosis present

## 2021-10-13 DIAGNOSIS — I2510 Atherosclerotic heart disease of native coronary artery without angina pectoris: Secondary | ICD-10-CM | POA: Diagnosis present

## 2021-10-13 DIAGNOSIS — Z86718 Personal history of other venous thrombosis and embolism: Secondary | ICD-10-CM

## 2021-10-13 DIAGNOSIS — E669 Obesity, unspecified: Secondary | ICD-10-CM | POA: Diagnosis present

## 2021-10-13 DIAGNOSIS — D696 Thrombocytopenia, unspecified: Secondary | ICD-10-CM

## 2021-10-13 DIAGNOSIS — I110 Hypertensive heart disease with heart failure: Secondary | ICD-10-CM | POA: Diagnosis present

## 2021-10-13 DIAGNOSIS — D751 Secondary polycythemia: Secondary | ICD-10-CM

## 2021-10-13 DIAGNOSIS — Z7982 Long term (current) use of aspirin: Secondary | ICD-10-CM | POA: Diagnosis not present

## 2021-10-13 DIAGNOSIS — I5021 Acute systolic (congestive) heart failure: Secondary | ICD-10-CM

## 2021-10-13 DIAGNOSIS — Z823 Family history of stroke: Secondary | ICD-10-CM | POA: Diagnosis not present

## 2021-10-13 DIAGNOSIS — Z955 Presence of coronary angioplasty implant and graft: Secondary | ICD-10-CM

## 2021-10-13 DIAGNOSIS — E785 Hyperlipidemia, unspecified: Secondary | ICD-10-CM | POA: Diagnosis present

## 2021-10-13 DIAGNOSIS — Z7984 Long term (current) use of oral hypoglycemic drugs: Secondary | ICD-10-CM

## 2021-10-13 DIAGNOSIS — I5023 Acute on chronic systolic (congestive) heart failure: Secondary | ICD-10-CM | POA: Diagnosis present

## 2021-10-13 DIAGNOSIS — R739 Hyperglycemia, unspecified: Secondary | ICD-10-CM

## 2021-10-13 DIAGNOSIS — Z8052 Family history of malignant neoplasm of bladder: Secondary | ICD-10-CM

## 2021-10-13 DIAGNOSIS — F17200 Nicotine dependence, unspecified, uncomplicated: Secondary | ICD-10-CM

## 2021-10-13 DIAGNOSIS — I10 Essential (primary) hypertension: Secondary | ICD-10-CM | POA: Diagnosis not present

## 2021-10-13 DIAGNOSIS — Z95810 Presence of automatic (implantable) cardiac defibrillator: Secondary | ICD-10-CM

## 2021-10-13 DIAGNOSIS — R0602 Shortness of breath: Secondary | ICD-10-CM | POA: Diagnosis present

## 2021-10-13 DIAGNOSIS — Z8249 Family history of ischemic heart disease and other diseases of the circulatory system: Secondary | ICD-10-CM | POA: Diagnosis not present

## 2021-10-13 DIAGNOSIS — I214 Non-ST elevation (NSTEMI) myocardial infarction: Principal | ICD-10-CM

## 2021-10-13 DIAGNOSIS — Z9981 Dependence on supplemental oxygen: Secondary | ICD-10-CM | POA: Diagnosis not present

## 2021-10-13 DIAGNOSIS — Z923 Personal history of irradiation: Secondary | ICD-10-CM

## 2021-10-13 DIAGNOSIS — I255 Ischemic cardiomyopathy: Secondary | ICD-10-CM | POA: Diagnosis present

## 2021-10-13 DIAGNOSIS — Z86711 Personal history of pulmonary embolism: Secondary | ICD-10-CM

## 2021-10-13 DIAGNOSIS — Z79899 Other long term (current) drug therapy: Secondary | ICD-10-CM | POA: Diagnosis not present

## 2021-10-13 DIAGNOSIS — Z7901 Long term (current) use of anticoagulants: Secondary | ICD-10-CM

## 2021-10-13 DIAGNOSIS — Z6835 Body mass index (BMI) 35.0-35.9, adult: Secondary | ICD-10-CM | POA: Diagnosis not present

## 2021-10-13 DIAGNOSIS — J441 Chronic obstructive pulmonary disease with (acute) exacerbation: Secondary | ICD-10-CM | POA: Diagnosis present

## 2021-10-13 DIAGNOSIS — Z8674 Personal history of sudden cardiac arrest: Secondary | ICD-10-CM | POA: Diagnosis not present

## 2021-10-13 DIAGNOSIS — Z20822 Contact with and (suspected) exposure to covid-19: Secondary | ICD-10-CM | POA: Diagnosis present

## 2021-10-13 DIAGNOSIS — Z720 Tobacco use: Secondary | ICD-10-CM

## 2021-10-13 DIAGNOSIS — Z7951 Long term (current) use of inhaled steroids: Secondary | ICD-10-CM | POA: Diagnosis not present

## 2021-10-13 DIAGNOSIS — Z8551 Personal history of malignant neoplasm of bladder: Secondary | ICD-10-CM

## 2021-10-13 DIAGNOSIS — J9601 Acute respiratory failure with hypoxia: Secondary | ICD-10-CM | POA: Diagnosis present

## 2021-10-13 HISTORY — PX: RIGHT/LEFT HEART CATH AND CORONARY ANGIOGRAPHY: CATH118266

## 2021-10-13 LAB — ECHOCARDIOGRAM COMPLETE
AR max vel: 2.54 cm2
AV Area VTI: 2.68 cm2
AV Area mean vel: 2.41 cm2
AV Mean grad: 2.5 mmHg
AV Peak grad: 3.6 mmHg
Ao pk vel: 0.95 m/s
Area-P 1/2: 5.58 cm2
Calc EF: 30 %
Height: 70 in
MV VTI: 2.87 cm2
S' Lateral: 6.3 cm
Single Plane A2C EF: 32.2 %
Single Plane A4C EF: 25.7 %
Weight: 3952 oz

## 2021-10-13 LAB — CBC
HCT: 52.8 % — ABNORMAL HIGH (ref 39.0–52.0)
Hemoglobin: 17.2 g/dL — ABNORMAL HIGH (ref 13.0–17.0)
MCH: 31 pg (ref 26.0–34.0)
MCHC: 32.6 g/dL (ref 30.0–36.0)
MCV: 95.1 fL (ref 80.0–100.0)
Platelets: 100 10*3/uL — ABNORMAL LOW (ref 150–400)
RBC: 5.55 MIL/uL (ref 4.22–5.81)
RDW: 11.9 % (ref 11.5–15.5)
WBC: 9.2 10*3/uL (ref 4.0–10.5)
nRBC: 0 % (ref 0.0–0.2)

## 2021-10-13 LAB — BASIC METABOLIC PANEL
Anion gap: 5 (ref 5–15)
BUN: 17 mg/dL (ref 8–23)
CO2: 29 mmol/L (ref 22–32)
Calcium: 8.7 mg/dL — ABNORMAL LOW (ref 8.9–10.3)
Chloride: 106 mmol/L (ref 98–111)
Creatinine, Ser: 1.04 mg/dL (ref 0.61–1.24)
GFR, Estimated: >60 mL/min (ref >60)
Glucose, Bld: 197 mg/dL — ABNORMAL HIGH (ref 70–99)
Potassium: 3.6 mmol/L (ref 3.5–5.1)
Sodium: 140 mmol/L (ref 135–145)

## 2021-10-13 LAB — MRSA NEXT GEN BY PCR, NASAL: MRSA by PCR Next Gen: NOT DETECTED

## 2021-10-13 LAB — APTT: aPTT: 54 seconds — ABNORMAL HIGH (ref 24–36)

## 2021-10-13 LAB — HEPARIN LEVEL (UNFRACTIONATED): Heparin Unfractionated: 0.64 IU/mL (ref 0.30–0.70)

## 2021-10-13 SURGERY — RIGHT/LEFT HEART CATH AND CORONARY ANGIOGRAPHY
Anesthesia: Moderate Sedation

## 2021-10-13 MED ORDER — FUROSEMIDE 10 MG/ML IJ SOLN: 40.0000 mg | Freq: Two times a day (BID) | INTRAMUSCULAR | Status: DC

## 2021-10-13 MED ORDER — SODIUM CHLORIDE 0.9% FLUSH: 3.0000 mL | INTRAVENOUS | Status: DC | PRN

## 2021-10-13 MED ORDER — SENNOSIDES-DOCUSATE SODIUM 8.6-50 MG PO TABS
1.0000 | ORAL_TABLET | Freq: Every day | ORAL | Status: DC
  Filled 2021-10-13: qty 1

## 2021-10-13 MED ORDER — SODIUM CHLORIDE 0.9 % IV SOLN: 250.0000 mL | INTRAVENOUS | Status: DC | PRN

## 2021-10-13 MED ORDER — LIDOCAINE HCL (PF) 1 % IJ SOLN
INTRAMUSCULAR | Status: DC | PRN
  Administered 2021-10-13: 2 mL
  Administered 2021-10-13: 5 mL

## 2021-10-13 MED ORDER — HEPARIN (PORCINE) 25000 UT/250ML-% IV SOLN: 1550.0000 [IU]/h | INTRAVENOUS | Status: DC

## 2021-10-13 MED ORDER — SENNA 8.6 MG PO TABS
1.0000 | ORAL_TABLET | Freq: Every day | ORAL | Status: DC
  Administered 2021-10-14 – 2021-10-19 (×6): 8.6 mg via ORAL
  Filled 2021-10-13 (×6): qty 1

## 2021-10-13 MED ORDER — METHYLPREDNISOLONE SODIUM SUCC 40 MG IJ SOLR
40.0000 mg | Freq: Two times a day (BID) | INTRAMUSCULAR | Status: DC
  Administered 2021-10-13: 40 mg via INTRAVENOUS
  Filled 2021-10-13: qty 1

## 2021-10-13 MED ORDER — SODIUM CHLORIDE 0.9 % IV SOLN: INTRAVENOUS | Status: DC

## 2021-10-13 MED ORDER — CARVEDILOL 6.25 MG PO TABS
6.2500 mg | ORAL_TABLET | Freq: Two times a day (BID) | ORAL | Status: DC
Start: 2021-10-13 — End: 2021-10-13
  Filled 2021-10-13 (×2): qty 1

## 2021-10-13 MED ORDER — VERAPAMIL HCL 2.5 MG/ML IV SOLN
INTRAVENOUS | Status: AC
  Filled 2021-10-13: qty 2

## 2021-10-13 MED ORDER — MIDAZOLAM HCL 2 MG/2ML IJ SOLN
INTRAMUSCULAR | Status: AC
  Filled 2021-10-13: qty 2

## 2021-10-13 MED ORDER — FUROSEMIDE 10 MG/ML IJ SOLN
INTRAMUSCULAR | Status: DC | PRN
  Administered 2021-10-13: 40 mg via INTRAVENOUS

## 2021-10-13 MED ORDER — MORPHINE SULFATE (PF) 2 MG/ML IV SOLN: 1.0000 mg | INTRAVENOUS | Status: DC | PRN

## 2021-10-13 MED ORDER — HEPARIN (PORCINE) IN NACL 1000-0.9 UT/500ML-% IV SOLN
INTRAVENOUS | Status: AC
  Filled 2021-10-13: qty 1000

## 2021-10-13 MED ORDER — CLOPIDOGREL BISULFATE 75 MG PO TABS
75.0000 mg | ORAL_TABLET | Freq: Every day | ORAL | Status: DC
  Administered 2021-10-13 – 2021-10-19 (×6): 75 mg via ORAL
  Filled 2021-10-13 (×7): qty 1

## 2021-10-13 MED ORDER — SENNOSIDES-DOCUSATE SODIUM 8.6-50 MG PO TABS: 1.0000 | ORAL_TABLET | Freq: Every day | ORAL | Status: DC

## 2021-10-13 MED ORDER — FLUTICASONE FUROATE-VILANTEROL 100-25 MCG/ACT IN AEPB
1.0000 | INHALATION_SPRAY | Freq: Every day | RESPIRATORY_TRACT | Status: DC
  Administered 2021-10-14 – 2021-10-19 (×5): 1 via RESPIRATORY_TRACT
  Filled 2021-10-13: qty 28

## 2021-10-13 MED ORDER — PRAVASTATIN SODIUM 80 MG PO TABS: 80.0000 mg | ORAL_TABLET | Freq: Every day | ORAL | Status: DC

## 2021-10-13 MED ORDER — MAGNESIUM SULFATE 2 GM/50ML IV SOLN
2.0000 g | Freq: Once | INTRAVENOUS | Status: AC
  Administered 2021-10-13: 2 g via INTRAVENOUS
  Filled 2021-10-13: qty 50

## 2021-10-13 MED ORDER — ONDANSETRON HCL 4 MG/2ML IJ SOLN: 4.0000 mg | Freq: Four times a day (QID) | INTRAMUSCULAR | Status: DC | PRN

## 2021-10-13 MED ORDER — ASPIRIN 81 MG PO CHEW: 81.0000 mg | CHEWABLE_TABLET | Freq: Every day | ORAL | Status: DC

## 2021-10-13 MED ORDER — CARVEDILOL 6.25 MG PO TABS
6.2500 mg | ORAL_TABLET | Freq: Two times a day (BID) | ORAL | Status: DC
  Administered 2021-10-13: 6.25 mg via ORAL
  Filled 2021-10-13: qty 1

## 2021-10-13 MED ORDER — DAPAGLIFLOZIN PROPANEDIOL 10 MG PO TABS
10.0000 mg | ORAL_TABLET | Freq: Every day | ORAL | Status: DC
  Administered 2021-10-14 – 2021-10-17 (×4): 10 mg via ORAL
  Filled 2021-10-13 (×6): qty 1

## 2021-10-13 MED ORDER — SODIUM CHLORIDE 0.9% FLUSH: 3.0000 mL | Freq: Two times a day (BID) | INTRAVENOUS | Status: DC

## 2021-10-13 MED ORDER — SPIRONOLACTONE 12.5 MG HALF TABLET
12.5000 mg | ORAL_TABLET | Freq: Every day | ORAL | Status: DC
  Administered 2021-10-13 – 2021-10-16 (×4): 12.5 mg via ORAL
  Filled 2021-10-13 (×4): qty 1

## 2021-10-13 MED ORDER — HEPARIN SODIUM (PORCINE) 1000 UNIT/ML IJ SOLN
INTRAMUSCULAR | Status: AC
  Filled 2021-10-13: qty 10

## 2021-10-13 MED ORDER — FLUTICASONE PROPIONATE 50 MCG/ACT NA SUSP
2.0000 | Freq: Every day | NASAL | Status: DC | PRN
  Administered 2021-10-17: 2 via NASAL
  Filled 2021-10-13 (×2): qty 16

## 2021-10-13 MED ORDER — LIDOCAINE HCL 1 % IJ SOLN
INTRAMUSCULAR | Status: AC
  Filled 2021-10-13: qty 20

## 2021-10-13 MED ORDER — SODIUM CHLORIDE 0.9% FLUSH
3.0000 mL | Freq: Two times a day (BID) | INTRAVENOUS | Status: DC
  Administered 2021-10-14 – 2021-10-17 (×4): 3 mL via INTRAVENOUS

## 2021-10-13 MED ORDER — POLYETHYLENE GLYCOL 3350 17 G PO PACK: 17.0000 g | PACK | Freq: Every day | ORAL | Status: DC

## 2021-10-13 MED ORDER — BISOPROLOL FUMARATE 5 MG PO TABS
2.5000 mg | ORAL_TABLET | Freq: Every day | ORAL | Status: DC
  Administered 2021-10-13 – 2021-10-18 (×6): 2.5 mg via ORAL
  Filled 2021-10-13 (×2): qty 0.5
  Filled 2021-10-13: qty 1
  Filled 2021-10-13 (×2): qty 0.5
  Filled 2021-10-13 (×2): qty 1

## 2021-10-13 MED ORDER — LOSARTAN POTASSIUM 25 MG PO TABS
25.0000 mg | ORAL_TABLET | Freq: Every day | ORAL | Status: DC
Start: 2021-10-14 — End: 2021-10-16
  Administered 2021-10-14 – 2021-10-15 (×2): 25 mg via ORAL
  Filled 2021-10-13 (×2): qty 1

## 2021-10-13 MED ORDER — IPRATROPIUM-ALBUTEROL 0.5-2.5 (3) MG/3ML IN SOLN
3.0000 mL | RESPIRATORY_TRACT | Status: DC | PRN
  Administered 2021-10-13 – 2021-10-16 (×2): 3 mL via RESPIRATORY_TRACT
  Filled 2021-10-13 (×2): qty 3

## 2021-10-13 MED ORDER — HEPARIN (PORCINE) IN NACL 1000-0.9 UT/500ML-% IV SOLN
INTRAVENOUS | Status: DC | PRN
  Administered 2021-10-13: 1000 mL

## 2021-10-13 MED ORDER — IPRATROPIUM-ALBUTEROL 0.5-2.5 (3) MG/3ML IN SOLN: 3.0000 mL | Freq: Three times a day (TID) | RESPIRATORY_TRACT | Status: DC

## 2021-10-13 MED ORDER — NITROGLYCERIN 0.4 MG SL SUBL: 0.4000 mg | SUBLINGUAL_TABLET | SUBLINGUAL | Status: DC | PRN

## 2021-10-13 MED ORDER — CHLORHEXIDINE GLUCONATE CLOTH 2 % EX PADS
6.0000 | MEDICATED_PAD | Freq: Every day | CUTANEOUS | Status: DC
  Administered 2021-10-13 – 2021-10-19 (×6): 6 via TOPICAL

## 2021-10-13 MED ORDER — IPRATROPIUM-ALBUTEROL 0.5-2.5 (3) MG/3ML IN SOLN: 3.0000 mL | Freq: Four times a day (QID) | RESPIRATORY_TRACT | Status: DC | PRN

## 2021-10-13 MED ORDER — POTASSIUM CHLORIDE CRYS ER 20 MEQ PO TBCR
40.0000 meq | EXTENDED_RELEASE_TABLET | Freq: Every day | ORAL | Status: DC
Start: 2021-10-13 — End: 2021-10-15
  Administered 2021-10-13 – 2021-10-15 (×3): 40 meq via ORAL
  Filled 2021-10-13 (×3): qty 2

## 2021-10-13 MED ORDER — FUROSEMIDE 10 MG/ML IJ SOLN
80.0000 mg | Freq: Two times a day (BID) | INTRAMUSCULAR | Status: DC
  Administered 2021-10-13 – 2021-10-15 (×4): 80 mg via INTRAVENOUS
  Filled 2021-10-13 (×4): qty 8

## 2021-10-13 MED ORDER — METHYLPREDNISOLONE SODIUM SUCC 40 MG IJ SOLR: 40.0000 mg | Freq: Two times a day (BID) | INTRAMUSCULAR | Status: DC

## 2021-10-13 MED ORDER — ASPIRIN 81 MG PO CHEW
81.0000 mg | CHEWABLE_TABLET | Freq: Every day | ORAL | Status: DC
  Administered 2021-10-13: 81 mg via ORAL
  Filled 2021-10-13: qty 1

## 2021-10-13 MED ORDER — FUROSEMIDE 10 MG/ML IJ SOLN
INTRAMUSCULAR | Status: AC
  Filled 2021-10-13: qty 4

## 2021-10-13 MED ORDER — VERAPAMIL HCL 2.5 MG/ML IV SOLN
INTRAVENOUS | Status: DC | PRN
  Administered 2021-10-13: 2.5 mg via INTRA_ARTERIAL

## 2021-10-13 MED ORDER — PRAVASTATIN SODIUM 40 MG PO TABS: 80.0000 mg | ORAL_TABLET | Freq: Every day | ORAL | Status: DC

## 2021-10-13 MED ORDER — SODIUM CHLORIDE 0.9% FLUSH
10.0000 mL | INTRAVENOUS | Status: DC | PRN
  Administered 2021-10-17: 10 mL

## 2021-10-13 MED ORDER — SODIUM CHLORIDE 0.9% FLUSH
10.0000 mL | Freq: Two times a day (BID) | INTRAVENOUS | Status: DC
  Administered 2021-10-13 – 2021-10-18 (×8): 10 mL

## 2021-10-13 MED ORDER — CARVEDILOL 12.5 MG PO TABS: 6.2500 mg | ORAL_TABLET | Freq: Two times a day (BID) | ORAL | Status: DC

## 2021-10-13 MED ORDER — ROSUVASTATIN CALCIUM 20 MG PO TABS
40.0000 mg | ORAL_TABLET | Freq: Every day | ORAL | Status: DC
  Administered 2021-10-13 – 2021-10-19 (×7): 40 mg via ORAL
  Filled 2021-10-13 (×7): qty 2

## 2021-10-13 MED ORDER — ACETAMINOPHEN 325 MG PO TABS: 650.0000 mg | ORAL_TABLET | ORAL | Status: DC | PRN

## 2021-10-13 MED ORDER — SODIUM CHLORIDE 0.9% FLUSH
3.0000 mL | Freq: Two times a day (BID) | INTRAVENOUS | Status: DC
Start: 2021-10-13 — End: 2021-10-13

## 2021-10-13 MED ORDER — IOHEXOL 300 MG/ML  SOLN
INTRAMUSCULAR | Status: DC | PRN
  Administered 2021-10-13: 58 mL

## 2021-10-13 MED ORDER — HEPARIN (PORCINE) 25000 UT/250ML-% IV SOLN
1900.0000 [IU]/h | INTRAVENOUS | Status: DC
  Administered 2021-10-13: 1550 [IU]/h via INTRAVENOUS
  Administered 2021-10-14: 13:00:00 1750 [IU]/h via INTRAVENOUS
  Administered 2021-10-15: 02:00:00 2150 [IU]/h via INTRAVENOUS
  Administered 2021-10-15: 2100 [IU]/h via INTRAVENOUS
  Administered 2021-10-16: 2000 [IU]/h via INTRAVENOUS
  Administered 2021-10-17: 1900 [IU]/h via INTRAVENOUS
  Administered 2021-10-17: 2000 [IU]/h via INTRAVENOUS
  Administered 2021-10-18: 1900 [IU]/h via INTRAVENOUS
  Filled 2021-10-13: qty 500
  Filled 2021-10-13 (×7): qty 250

## 2021-10-13 MED ORDER — HEPARIN SODIUM (PORCINE) 1000 UNIT/ML IJ SOLN
INTRAMUSCULAR | Status: DC | PRN
  Administered 2021-10-13: 5000 [IU] via INTRAVENOUS

## 2021-10-13 MED ORDER — METHYLPREDNISOLONE SODIUM SUCC 40 MG IJ SOLR
40.0000 mg | Freq: Two times a day (BID) | INTRAMUSCULAR | Status: DC
  Administered 2021-10-13 – 2021-10-15 (×4): 40 mg via INTRAVENOUS
  Filled 2021-10-13 (×4): qty 1

## 2021-10-13 MED ORDER — HEPARIN BOLUS VIA INFUSION
2900.0000 [IU] | Freq: Once | INTRAVENOUS | Status: AC
Start: 2021-10-13 — End: 2021-10-13
  Administered 2021-10-13: 2900 [IU] via INTRAVENOUS
  Filled 2021-10-13: qty 2900

## 2021-10-13 MED ORDER — ASPIRIN EC 81 MG PO TBEC
81.0000 mg | DELAYED_RELEASE_TABLET | Freq: Every day | ORAL | Status: DC
  Administered 2021-10-14 – 2021-10-17 (×4): 81 mg via ORAL
  Filled 2021-10-13 (×4): qty 1

## 2021-10-13 MED ORDER — POLYETHYLENE GLYCOL 3350 17 G PO PACK
17.0000 g | PACK | Freq: Every day | ORAL | Status: DC
  Filled 2021-10-13: qty 1

## 2021-10-13 MED ORDER — FENTANYL CITRATE (PF) 100 MCG/2ML IJ SOLN
INTRAMUSCULAR | Status: AC
  Filled 2021-10-13: qty 2

## 2021-10-13 SURGICAL SUPPLY — 13 items
CATH BALLN WEDGE 5F 110CM (CATHETERS) ×2 IMPLANT
CATH INFINITI 5FR ANG PIGTAIL (CATHETERS) ×2 IMPLANT
CATH INFINITI 5FR JK (CATHETERS) ×2 IMPLANT
DEVICE RAD COMP TR BAND LRG (VASCULAR PRODUCTS) ×2 IMPLANT
DRAPE BRACHIAL (DRAPES) ×4 IMPLANT
GLIDESHEATH SLEND SS 6F .021 (SHEATH) ×2 IMPLANT
GUIDEWIRE INQWIRE 1.5J.035X260 (WIRE) IMPLANT
INQWIRE 1.5J .035X260CM (WIRE) ×3
PACK CARDIAC CATH (CUSTOM PROCEDURE TRAY) ×3 IMPLANT
PROTECTION STATION PRESSURIZED (MISCELLANEOUS) ×3
SET ATX SIMPLICITY (MISCELLANEOUS) ×2 IMPLANT
SHEATH GLIDE SLENDER 4/5FR (SHEATH) ×2 IMPLANT
STATION PROTECTION PRESSURIZED (MISCELLANEOUS) IMPLANT

## 2021-10-13 NOTE — Plan of Care (Signed)

## 2021-10-13 NOTE — Assessment & Plan Note (Signed)
Per cath, elevated right and left heart pressures ?Initiated on IV Lasix 40 mg every 12. ?Being transferred to [facility] under the AHF team and may need inotropic support. ?

## 2021-10-13 NOTE — Assessment & Plan Note (Addendum)
CAD s/p PCI 2002, history of MI, coded x3, AICD ?Presented with chest pain and dyspnea, similar to when he had prior MI. ?Chest pain ongoing for approximately 1 to 2 months, worsened night of admission. ?HS troponin peaked to 5795. ?SL NTG given did not help and patient does not use NTG at home. ?Requested medical records from [place]. ?Continue aspirin, IV heparin, carvedilol, dose reduced and pravastatin has been changed to high intensity statin. ?Cardiology was consulted and underwent cardiac cath that showed 90% proximal/mid LAD disease and 2 large aneurysmal areas in the diseased segment, LVEF 10%, right heart cath with severely elevated right and left-sided filling pressures.  Cardiology initiated him on IV Lasix, recommend transferring him to [facility] under the advanced heart failure service.  They have contacted their partners at [facility] who have accepted.  He may need inotropic support.  He may be a candidate for PCI after stabilization of his CHF. ?

## 2021-10-13 NOTE — Consult Note (Signed)
Cardiology Consult    Patient ID: Paul Bullock MRN: [id number], DOB/AGE: 03-05-1946   Admit date: 10/12/2021 Date of Consult: 10/13/2021  Primary Physician: Paul Bullock Primary Cardiologist: Paul Sacramento, MD Requesting Provider: A. Hongalgi, MD  Patient Profile    Paul Bullock is a 76 y.o. 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  Past Medical History   Past Medical History:  Diagnosis Date   AICD (automatic cardioverter/defibrillator) present    a. 11/2014 s/p MDT DDMB 1D4 [facility] MRI XT DR AICD (ser# [id number]).   Bladder cancer ([place])    a. 2013 s/p chemo/xrt.   CAD (coronary artery disease)    a. 11/2020 s/p MI/cardiac arrest-->DES to OM x 1; b. 10/2011 Cath: LAD 47mw/ aneurysmal post-stenotic dilation-->med rx. OM 50 distal to prior stent.   Chronic HFimpEF (heart failure with improved ejection fraction) (HCC)    a. EF prev as low as 20% w/ subsequent improvement to 50-55%; b. 11/2020 limited echo: "windows werer challenging...LVEF largely preserved."   COPD (chronic obstructive pulmonary disease) (HCC)    Depression    Diverticulitis    DVT (deep venous thrombosis) ([place]    Essential hypertension    Hyperlipidemia LDL goal <70    Ischemic cardiomyopathy    a. EF prev as low as 20%; b. 11/2014 s/p MDT [id number] MRI XT DR AICD; c. subsequent improvement in EF to 50-55%; d. 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  Patient suffered a myocardial infarction complicated by cardiac arrest in 2002 and required stenting of the obtuse marginal.  He notes that he subsequently had LV dysfunction.  In 2013, he required repeat diagnostic catheterization which revealed moderate disease distal to previously placed obtuse marginal stent with a 75% stenosis in the mid LAD with aneurysmal post stenotic dilation.  Per notes, he was medically managed.  In the setting of LV dysfunction with an EF as low as 20%, he underwent [employer] AICD placement in April 2016, and up until late last year, [place]  [facility] in the setting of COPD exacerbation while visiting his son.  Per discharge summary, he was noted to have mild troponin elevation which was felt to be secondary to demand ischemia.  [place]  been living locally.  He does not routinely exercise.  His ex-wife lives nearby and they are close friends.  Approximate 2 months ago, he began experiencing what started out as fairly focal left-sided dull aching, occurring at rest a few days a week but subsequently became more retrosternal as well as retroscapular, occurring with exertion, associated with dyspnea, lasting 5 to 10 minutes, and resolving spontaneously.  Over the  past week, this has been occurring almost daily and sometimes nocturnally as well.  He does not typically use nitroglycerin at home.  On March 9, he developed recurrent chest discomfort when prepping to leave his home at around noon.  This was initially mild.  He went to a quilt show at a [employer] and noted that walking around [employer], his discomfort seem to get worse and became more dyspneic, prompting him to go out to his car and rest.  He sat in his car for about another hour and a half waiting for his ex-wife.  They then went out for an early dinner.  While eating his meal, he noted progressive worsening of retrosternal chest discomfort and by the time the meal had finished and they walked out to the car, he was quite dyspneic and the chest discomfort was unbearable.  He drove [facility] ED, where he was noted to be in sinus tachycardia with an IVCD and nonspecific T changes.  BNP was elevated at 431.7.  Initial troponin was 5778 with a follow-up of 5795.  Thrombocytopenia was noted with platelet count of 110,000, which was similar to what was seen during admission in June 2022.  He was placed on heparin.  Chest pain eventually resolved at some point during the night, and he was able to sleep reasonably well.  He has no chest pain this morning.  Inpatient Medications     aspirin  81 mg Oral Daily   carvedilol  12.5 mg Oral BID   cholecalciferol  5,000 Units Oral Daily   fluticasone furoate-vilanterol  1 puff Inhalation q AM   ipratropium-albuterol  3 mL Nebulization TID   lisinopril  10 mg Oral Daily   methylPREDNISolone (SOLU-MEDROL) injection  40 mg Intravenous Q12H   multivitamin with minerals  1 tablet Oral Daily   polyethylene glycol  17 g Oral Daily   pravastatin  80 mg Oral q1800   senna-docusate  1 tablet Oral Daily   vitamin B-12  2,500 mcg Oral Daily    Family History    Family History  Problem Relation Age of Onset   Stroke Mother    Bladder Cancer Father    Heart  attack Brother    He indicated that his mother is deceased. He indicated that his father is deceased. He indicated that his brother is deceased.   Social History    Social History   Socioeconomic History   Marital status: Single    Spouse name: Not on file   Number of children: Not on file   Years of education: Not on file   Highest education level: Not on file  Occupational History   Not on file  Tobacco Use   Smoking status: Every Day    Packs/day: 0.75    Years: 55.00    Pack years: 41.25    Types: Cigarettes   Smokeless tobacco: Never  Substance and Sexual Activity   Alcohol use: Never   Drug use: Never   Sexual activity: Not Currently  Other Topics Concern   Not on file  Social History Narrative   Since Dec 2022, he has been living in [place].  His ex-wife lives here and they are very close friends.  He does not routinely exercise.   Social Determinants of Health   Financial Resource Strain: Not on file  Food Insecurity: Not on file  Transportation Needs: Not on file  Physical Activity: Not on file  Stress: Not on file  Social Connections: Not on file  Intimate Partner Violence: Not on file     Review of Systems    General:  No chills, fever, night sweats or weight changes.  Cardiovascular:  +++ chest pain, +++ dyspnea on exertion, occasional lower extremity edema, no orthopnea, palpitations, paroxysmal nocturnal dyspnea. Dermatological: No rash, lesions/masses Respiratory: No cough, dyspnea Urologic: No hematuria, dysuria Abdominal:   No nausea, vomiting, diarrhea, bright red blood per rectum, melena, or hematemesis Neurologic:  No visual changes, wkns, changes in mental status. All other systems reviewed and are otherwise negative except as noted above.  Physical Exam    Blood pressure (!) 149/70, pulse 91, temperature 97.9 F (36.6 C), temperature source Oral, resp. rate (!) 22, height '5\' 10"'$  (1.778 m), weight 112 kg, SpO2 93 %.  General:  Pleasant, NAD Psych: Normal affect. Neuro: Alert and oriented X 3. Moves all extremities spontaneously. HEENT: Normal  Neck: Supple, obese, difficult to gauge JVP.  No bruits.  Lungs:  Resp regular and unlabored, markedly diminished breath sounds bilaterally. Heart: RRR, distant, no s3, s4, or murmurs. Abdomen: Obese, soft, non-tender, non-distended, BS + x 4.  Extremities: No clubbing, cyanosis.  Trace bilateral ankle edema just above the sock line. DP/PT1+, Radials 2+ and equal bilaterally.  Labs    Cardiac Enzymes Recent Labs  Lab 10/12/21 1927 10/12/21 2106  TROPONINIHS 5,778* 5,795*     BNP    Component Value Date/Time   BNP 431.7 (H) 10/12/2021 1707    Lab Results  Component Value Date   WBC 9.2 10/13/2021   HGB 17.2 (H) 10/13/2021   HCT 52.8 (H) 10/13/2021   MCV 95.1 10/13/2021   PLT 100 (L) 10/13/2021    Recent Labs  Lab 10/12/21 1707 10/13/21 0506  NA 139 140  K 3.5 3.6  CL 101 106  CO2 30 29  BUN 15 17  CREATININE 1.09 1.04  CALCIUM 9.3 8.7*  PROT 7.2  --   BILITOT 2.7*  --   ALKPHOS 76  --   ALT 23  --   AST 59*  --   GLUCOSE 190* 197*      Radiology Studies    DG Chest Port 1 View  Result Date: 10/12/2021 CLINICAL DATA:  Shortness of breath.  History of COPD. EXAM: PORTABLE CHEST 1 VIEW COMPARISON:  None available. FINDINGS: Dual lead left-sided pacemaker in place. The heart is enlarged. Aortic atherosclerosis. There is diffuse peribronchial thickening and interstitial coarsening. Limited left basilar assessment due to soft tissue attenuation from habitus. No confluent airspace disease. No large pleural effusion. No pneumothorax. IMPRESSION: Cardiomegaly. Diffuse peribronchial thickening and interstitial coarsening, may represent pulmonary edema, acute bronchitis, or chronic lung disease. No prior exams available for comparison. Aortic Atherosclerosis (ICD10-I70.0). Electronically Signed   By: Keith Rake M.D.   On: 10/12/2021 17:39    ECG &  Cardiac Imaging    Sinus tachycardia, 108, leftward axis, ? LAE, IVCD w/ nonspecific T changes - personally reviewed.  Assessment & Plan    1.  Non-STEMI/CAD: Patient with prior history of coronary artery disease status post  myocardial infarction and cardiac arrest in 2002 with stenting of the obtuse marginal.  He subsequently underwent diagnostic catheterization in 2013 which revealed moderate LAD stenosis with aneurysmal dilation distal to the stenosis as well as patent obtuse marginal stent and moderate disease distal to the stent.  He has been medically managed since then and followed by cardiology in [place], [place].  Over the past 2 months, since moving to [place], he has been experiencing rest and exertional retrosternal chest discomfort associated with dyspnea, which has progressed in frequency and duration over the past week.  He had a prolonged episode yesterday, prompting him to present to the emergency department where he was found to have significant elevation of troponin of 5778  5795.  He is currently on heparin and chest pain-free.  We discussed options for management and agreed to pursue diagnostic catheterization today.  The patient understands that risks include but are not limited to stroke (1 in 1000), death (1 in 82), kidney failure [usually temporary] (1 in 500), bleeding (1 in 200), allergic reaction [possibly serious] (1 in 200), and agrees to proceed.  Continue aspirin, beta-blocker, ACE inhibitor, and statin therapy.  Low threshold to add venous nitrates for recurrent pain.  2.  Chronic heart failure with improved ejection fraction/ischemic cardiomyopathy: EF previously as low as 20% status post ICD in 2016 with subsequent improvement of LV function.  Cardiology notes from April 2022 indicate that he had a bedside limited echo at that time which showed preserved EF.  BNP was moderately elevated at 431.7.  He does use Lasix at home.  Body habitus makes exam challenging  though he does not appear to be grossly volume overloaded.  Continue beta-blocker and ACE inhibitor..  Echo pending.  We will be able to assess left ventricular end-diastolic pressure at the time of his diagnostic catheterization to help guide diuretic therapy.    3.  Essential hypertension: Blood pressure currently elevated in the emergency department.  Resume home medications and follow-up today.  Adjust as necessary.  4.  Hyperlipidemia: Patient previously intolerant to atorvastatin and simvastatin and takes Livalo at home.  Currently on pravastatin.  Follow-up lipids.  He would be willing to try rosuvastatin if lipids not at goal.    5.  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  Reports good compliance.  Last dose was yesterday morning.  This is on hold and he is currently on heparin, pending diagnostic catheterization.  Plan to resume Eliquis prior to discharge.  6.  Thrombocytopenia: Per patient and prior notes, this is chronic.  He says that at the time of his bladder cancer diagnosis in 2013, he did require platelet transfusions but was also having massive hematuria at that time.  Follow on heparin and potential addition of antiplatelet therapy.  7.  COPD/tobacco abuse: He has been smoking less than a half a pack a day for his entire adult life.  Complete cessation advised.  He moves very little air on exam.  Steroid and nebulizer/inhaler therapy per medicine team.  Signed, Murray Hodgkins, NP 10/13/2021, 9:37 AM  For questions or updates, please contact   Please consult [email or web address] for contact info under Cardiology/STEMI.

## 2021-10-13 NOTE — Consult Note (Addendum)
ANTICOAGULATION CONSULT NOTE  ? ?Pharmacy Consult for heparin ?Indication: chest pain/ACS ? ?No Known Allergies ? ?Patient Measurements: ?  ?Heparin Dosing Weight: 97.2 kg ? ?Vital Signs: ?BP: 114/67 (03/10 1502) ?Pulse Rate: 83 (03/10 1502) ? ?Labs: ?Recent Labs  ?  10/12/21 ?1707 10/12/21 ?1927 10/12/21 ?2106 10/13/21 ?4098  ?HGB 18.5*  --   --  17.2*  ?HCT 54.8*  --   --  52.8*  ?PLT 110*  --   --  100*  ?APTT  --   --  27 54*  ?LABPROT  --   --  14.4  --   ?INR  --   --  1.1  --   ?HEPARINUNFRC  --   --  1.09* 0.64  ?CREATININE 1.09  --   --  1.04  ?TROPONINIHS  --  1,191* 5,795*  --   ? ? ? ?Estimated Creatinine Clearance: 79.3 mL/min (by C-G formula based on SCr of 1.04 mg/dL). ? ? ?Medical History: ?Past Medical History:  ?Diagnosis Date  ? AICD (automatic cardioverter/defibrillator) present   ? a. 11/2014 s/p MDT DDMB 1D4 [facility] MRI XT DR AICD (ser# [id number]).  ? Bladder cancer ([place])   ? a. 2013 s/p chemo/xrt.  ? CAD (coronary artery disease)   ? a. 11/2020 s/p MI/cardiac arrest-->DES to LCX x 1; b. 10/2011 Cath: LAD 34mw/ aneurysmal post-stenotic dilation-->med rx. LCX 50d to prior stent; c. 10/2021 NSTEMI/Cath: LM 30ost/m, LAD 90p/m, 917mLCX 20p/m, patent stent, LPAV 60, RCA mild diff dzs, EF<25%, LVEDP 4046m.  ? Chronic HFrEF (heart failure with reduced ejection fraction) ([facility] ? a. EF prev as low as 20% w/ subsequent improvement to 50-55%; b. 11/2020 limited echo: "windows were challenging...LVEF largely preserved."; c. 10/2021 LV gram: EF <25%, LVEDP 52m76m  ? COPD (chronic obstructive pulmonary disease) (HCC)[place]? Depression   ? Diverticulitis   ? DVT (deep venous thrombosis) (HCC)[place]? Essential hypertension   ? Hyperlipidemia LDL goal <70   ? Ischemic cardiomyopathy   ? a. EF prev as low as 20%; b. 11/2014 s/p MDT [id number] MRI XT DR AICD; c. subsequent improvement in EF to 50-55%; d. 11/2020 limited echo: "windows were challenging...LVEF largely preserved."; e. 10/2021 LV Gram: EF <25%.  ? MI  (myocardial infarction) (HCC)[place]? Migraines   ? Morbid obesity (HCC)[place]? 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  ? Thrombocytopenia (HCC)[place]? Tobacco abuse   ? Varicose veins of left lower extremity   ? ? ?Medications:  ?Medications Prior to Admission  ?Medication Sig Dispense Refill Last Dose  ? albuterol (PROVENTIL) (2.5 MG/3ML) 0.083% nebulizer solution Take 2.5 mg by nebulization every 6 (six) hours as needed for wheezing or shortness of breath.     ? [START ON 10/14/2021] aspirin 81 MG chewable tablet Chew 1 tablet (81 mg total) by mouth daily.     ? carvedilol (COREG) 12.5 MG tablet Take 0.5 tablets (6.25 mg total) by mouth 2 (two) times daily.     ? Cholecalciferol (VITAMIN D-3) 125 MCG (5000 UT) TABS Take 5,000 Units by mouth daily.     ? Cyanocobalamin 2500 MCG TABS Take 2,500 mcg by mouth daily.     ? fluticasone (FLONASE) 50 MCG/ACT nasal spray Place 2 sprays into both nostrils daily.     ? fluticasone furoate-vilanterol (BREO ELLIPTA) 100-25 MCG/ACT AEPB Inhale 1 puff into the lungs in the morning.     ?  furosemide (LASIX) 10 MG/ML injection Inject 4 mLs (40 mg total) into the vein 2 (two) times daily.     ? heparin 25000 UT/250ML infusion Inject 1,550 Units/hr into the vein continuous.     ? ipratropium-albuterol (DUONEB) 0.5-2.5 (3) MG/3ML SOLN Take 3 mLs by nebulization 3 (three) times daily.     ? methylPREDNISolone sodium succinate (SOLU-MEDROL) 40 mg/mL injection Inject 1 mL (40 mg total) into the vein every 12 (twelve) hours.     ? Morphine Sulfate (MORPHINE, PF,) 2 MG/ML injection Inject 0.5 mLs (1 mg total) into the vein every 4 (four) hours as needed (Moderate pain, severe pain.).     ? Multiple Vitamins-Minerals (MULTIVITAMIN WITH MINERALS) tablet Take 1 tablet by mouth daily.     ? nitroGLYCERIN (NITROSTAT) 0.4 MG SL tablet Place 1 tablet (0.4 mg total) under the tongue every 5 (five) minutes as needed for chest pain.     ? [START ON 10/14/2021] polyethylene glycol  (MIRALAX / GLYCOLAX) 17 g packet Take 17 g by mouth daily.     ? pravastatin (PRAVACHOL) 80 MG tablet Take 1 tablet (80 mg total) by mouth daily at 6 PM.     ? [START ON 10/14/2021] senna-docusate (SENOKOT-S) 8.6-50 MG tablet Take 1 tablet by mouth daily.     ? ?Scheduled:  ? [START ON 10/14/2021] aspirin EC  81 mg Oral Daily  ? carvedilol  6.25 mg Oral BID WC  ? dapagliflozin propanediol  10 mg Oral Daily  ? [START ON 10/14/2021] fluticasone furoate-vilanterol  1 puff Inhalation Daily  ? furosemide  40 mg Intravenous BID  ? [START ON 10/14/2021] losartan  25 mg Oral Daily  ? methylPREDNISolone (SOLU-MEDROL) injection  40 mg Intravenous Q12H  ? pravastatin  80 mg Oral q1800  ? [START ON 10/14/2021] senna  1 tablet Oral Daily  ? sodium chloride flush  3 mL Intravenous Q12H  ? spironolactone  12.5 mg Oral Daily  ? ?Infusions:  ? sodium chloride    ? ?PRN: sodium chloride, acetaminophen, fluticasone, ipratropium-albuterol, ipratropium-albuterol, ondansetron (ZOFRAN) IV, sodium chloride flush ?Anti-infectives (From admission, onward)  ? ? None  ? ?  ? ? ?Assessment: ?76 yo male with NSTEMI s/p cath with severe LAD disease and HF with severe overload.  He is on apixaban PTA for history of DVT/PE (last dose taken 3/9 in the evening).  Pharmacy to restart heparin at 7:45pm per cardiology request.  ?-previous heparin rate 1550 units/hr ? ?Goal of Therapy:  ?aPTT 66-102 seconds ?Heparin level 0.3-0.7 units/ml once aPTT and heparin level correlate.  ?Monitor platelets by anticoagulation protocol: Yes ? ?Plan:  ?Resume heparin 1550 units/hr at 8:45 pm ?Heparin level, aPTT and CBC in am ? ?Hildred Laser, PharmD ?Clinical Pharmacist ?**Pharmacist phone directory can now be found on [email or web address] ([email or web address]).  Listed under [place]. ? ? ? ? ? ?

## 2021-10-13 NOTE — Progress Notes (Signed)
*  PRELIMINARY RESULTS* ?Echocardiogram ?2D Echocardiogram has been performed. ? ?Yulia Ulrich, Sonia Side ?10/13/2021, 1:58 PM ?

## 2021-10-13 NOTE — Progress Notes (Signed)
Peripherally Inserted Central Catheter Placement ? ?The IV Nurse has discussed with the patient and/or persons authorized to consent for the patient, the purpose of this procedure and the potential benefits and risks involved with this procedure.  The benefits include less needle sticks, lab draws from the catheter, and the patient may be discharged home with the catheter. Risks include, but not limited to, infection, bleeding, blood clot (thrombus formation), and puncture of an artery; nerve damage and irregular heartbeat and possibility to perform a PICC exchange if needed/ordered by physician.  Alternatives to this procedure were also discussed.  Bard Power PICC patient education guide, fact sheet on infection prevention and patient information card has been provided to patient /or left at bedside.   ? ?PICC Placement Documentation  ?PICC Double Lumen 09/38/18 Right Cephalic 39 cm 0 cm (Active)  ?Indication for Insertion or Continuance of Line Prolonged intravenous therapies 10/13/21 2030  ?Exposed Catheter (cm) 0 cm 10/13/21 2030  ?Site Assessment Clean, Dry, Intact 10/13/21 2030  ?Lumen #1 Status Saline locked;Blood return noted 10/13/21 2030  ?Lumen #2 Status Saline locked;Blood return noted 10/13/21 2030  ?Dressing Type Securing device;Transparent 10/13/21 2030  ?Dressing Status Antimicrobial disc in place;Clean, Dry, Intact 10/13/21 2030  ?Safety Lock Not Applicable 29/93/71 6967  ?Line Care Connections checked and tightened 10/13/21 2030  ?Line Adjustment (NICU/IV Team Only) No 10/13/21 2030  ?Dressing Intervention New dressing 10/13/21 2030  ?Dressing Change Due 10/20/21 10/13/21 2030  ? ? ? ? ? ?Rosalio Macadamia Chenice ?10/13/2021, 8:51 PM ? ?

## 2021-10-13 NOTE — H&P (Addendum)
History & Physical    Patient ID: Paul Bullock MRN: [id number], DOB/AGE: August 21, 1945   Admit date: 10/13/2021    Primary Physician: Paul Bullock Primary Cardiologist: Paul Sacramento, MD  Patient Profile    Paul Bullock is a 76 y.o. 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  Past Medical History    Past Medical History:  Diagnosis Date   AICD (automatic cardioverter/defibrillator) present    a. 11/2014 s/p MDT DDMB 1D4 [facility] MRI XT DR AICD (ser# [id number]).   Bladder cancer ([place])    a. 2013 s/p chemo/xrt.   CAD (coronary artery disease)    a. 11/2020 s/p MI/cardiac arrest-->DES to LCX x 1; b. 10/2011 Cath: LAD 11mw/ aneurysmal post-stenotic dilation-->med rx. LCX 50d to prior stent; c. 10/2021 NSTEMI/Cath: LM 30ost/m, LAD 90p/m, 914mLCX 20p/m, patent stent, LPAV 60, RCA mild diff dzs, EF<25%, LVEDP 4032m.   Chronic HFrEF (heart failure with reduced ejection fraction) (HCC)    a. EF prev as low as 20% w/ subsequent improvement to 50-55%; b. 11/2020 limited echo: "windows were challenging...LVEF largely preserved."; c. 10/2021 LV gram: EF <25%, LVEDP 66m58m   COPD (chronic obstructive pulmonary disease) (HCC)    Depression    Diverticulitis    DVT (deep venous thrombosis) (HCC)[place] Essential hypertension    Hyperlipidemia LDL goal <70    Ischemic cardiomyopathy    a. EF prev as low as 20%; b. 11/2014 s/p MDT [id number] MRI XT DR AICD; c. subsequent improvement in EF to 50-55%; d. 11/2020 limited echo: "windows were challenging...LVEF largely preserved."; e. 10/2021 LV Gram: EF <25%.   MI (myocardial infarction) (HCC)[place] Migraines    Morbid obesity (HCC)[place] Pulmonary embolism (unprovoked)    a. x 2 -->chronic eliquis.    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  Patient suffered a myocardial infarction complicated by cardiac arrest in 2002 and required stenting of the obtuse marginal.  He notes that he subsequently had LV dysfunction.  In 2013, he required repeat diagnostic catheterization which revealed moderate disease distal to previously placed obtuse marginal stent with a 75% stenosis in the mid LAD with aneurysmal post stenotic dilation.  Per notes, he was medically managed.  In the setting of LV dysfunction with an EF as low as 20%, he underwent [employer] AICD placement in April 2016, and up until late last year, [place] [facility], in the setting of COPD exacerbation while visiting his son.  Per discharge summary, he was noted to have  mild troponin elevation which was felt to be secondary to demand ischemia.   Paul Bullock moved to [place] in December 2022 and has been living locally.  He does not routinely exercise.  His ex-wife lives nearby and they are close friends.  Approximate 2 months ago, he began experiencing what started out as fairly focal left-sided dull aching, occurring at rest a few days a week but subsequently became more retrosternal as well as retroscapular,  occurring with exertion, associated with dyspnea, lasting 5 to 10 minutes, and resolving spontaneously.  Over the past week, this has been occurring almost daily and sometimes nocturnally as well.  He does not typically use nitroglycerin at home.  On March 9, he developed recurrent chest discomfort when prepping to leave his home at around noon.  This was initially mild.  He went to a quilt show at a [employer] and noted that walking around [employer], his discomfort seem to get worse and became more dyspneic, prompting him to go out to his car and rest.  He sat in his car for about another hour and a half waiting for his ex-wife.  They then went out for an early dinner.  While eating his meal, he noted progressive worsening of retrosternal chest discomfort and by the time the meal had finished and they walked out to the car, he was quite dyspneic and the chest discomfort was unbearable.  He drove to the [facility] ED, where he was noted to be in sinus tachycardia with an IVCD and nonspecific T changes.  BNP was elevated at 431.7.  Initial troponin was 5778 with a follow-up of 5795.  Thrombocytopenia was noted with platelet count of 110,000, which was similar to what was seen during admission in June 2022.  He was placed on heparin.  Chest pain eventually resolved at some point during the night, and he was able to sleep reasonably well.  He had no chest pain this morning.  He underwent diagnostic catheterization at [facility] regional revealing severe proximal to mid LAD disease with otherwise nonobstructive disease and patent obtuse marginal stent.  His LVEDP was approximately 40 mmHg and ventriculogram showed an EF of less than 25%.  He was given Lasix in the lab and after discussion with our advanced heart failure team at [facility], he was transferred for further evaluation and potential inotropic support.  Home Medications    Prior to Admission medications   Medication Sig Start Date End Date Taking?  Authorizing Provider  albuterol (PROVENTIL) (2.5 MG/3ML) 0.083% nebulizer solution Take 2.5 mg by nebulization every 6 (six) hours as needed for wheezing or shortness of breath.    [provider]  apixaban (ELIQUIS) 5 MG TABS tablet Take 5 mg by mouth 2 (two) times daily.    [provider]  aspirin 81 MG chewable tablet Chew 1 tablet (81 mg total) by mouth daily. 10/14/21   Hongalgi, Lenis Dickinson, MD  carvedilol (COREG) 12.5 MG tablet Take 0.5 tablets (6.25 mg total) by mouth 2 (two) times daily. 10/13/21   Hongalgi, Lenis Dickinson, MD  Cholecalciferol (VITAMIN D-3) 125 MCG (5000 UT) TABS Take 5,000 Units by mouth daily.    [provider]  Cyanocobalamin 2500 MCG TABS Take 2,500 mcg by mouth daily.    [provider]  fluticasone (FLONASE) 50 MCG/ACT nasal spray Place 2 sprays into both nostrils daily.    [provider]  fluticasone furoate-vilanterol (BREO ELLIPTA) 100-25 MCG/ACT AEPB Inhale 1 puff into the lungs  in the morning.    [provider]  furosemide (LASIX) 10 MG/ML injection Inject 4 mLs (40 mg total) into the vein 2 (two) times daily. 10/13/21   Hongalgi, Lenis Dickinson, MD  furosemide (LASIX) 20 MG tablet Take 20 mg by mouth daily.    [provider]  heparin 25000 UT/250ML infusion Inject 1,550 Units/hr into the vein continuous. 10/13/21   Hongalgi, Lenis Dickinson, MD  ipratropium-albuterol (DUONEB) 0.5-2.5 (3) MG/3ML SOLN Take 3 mLs by nebulization 3 (three) times daily. 10/13/21   Hongalgi, Lenis Dickinson, MD  lisinopril (ZESTRIL) 10 MG tablet Take 10 mg by mouth daily.    [provider]  methylPREDNISolone sodium succinate (SOLU-MEDROL) 40 mg/mL injection Inject 1 mL (40 mg total) into the vein every 12 (twelve) hours. 10/13/21   Hongalgi, Lenis Dickinson, MD  Morphine Sulfate (MORPHINE, PF,) 2 MG/ML injection Inject 0.5 mLs (1 mg total) into the vein every 4 (four) hours as needed (Moderate pain, severe pain.). 10/13/21   Hongalgi, Lenis Dickinson, MD  Multiple  Vitamins-Minerals (MULTIVITAMIN WITH MINERALS) tablet Take 1 tablet by mouth daily.    [provider]  nitroGLYCERIN (NITROSTAT) 0.4 MG SL tablet Place 1 tablet (0.4 mg total) under the tongue every 5 (five) minutes as needed for chest pain. 10/13/21   Hongalgi, Lenis Dickinson, MD  nystatin (MYCOSTATIN) 100000 UNIT/ML suspension Take 10 mLs by mouth 4 (four) times daily.    [provider]  Pitavastatin Calcium 4 MG TABS Take 4 mg by mouth every evening.    [provider]  polyethylene glycol (MIRALAX / GLYCOLAX) 17 g packet Take 17 g by mouth daily. 10/14/21   Hongalgi, Lenis Dickinson, MD  pravastatin (PRAVACHOL) 80 MG tablet Take 1 tablet (80 mg total) by mouth daily at 6 PM. 10/13/21   Hongalgi, Lenis Dickinson, MD  senna-docusate (SENOKOT-S) 8.6-50 MG tablet Take 1 tablet by mouth daily. 10/14/21   Hongalgi, Lenis Dickinson, MD    Family History    Family History  Problem Relation Age of Onset   Stroke Mother    Bladder Cancer Father    Heart attack Brother    He indicated that his mother is deceased. He indicated that his father is deceased. He indicated that his brother is deceased.   Social History    Social History   Socioeconomic History   Marital status: Single    Spouse name: Not on file   Number of children: Not on file   Years of education: Not on file   Highest education level: Not on file  Occupational History   Not on file  Tobacco Use   Smoking status: Every Day    Packs/day: 0.75    Years: 55.00    Pack years: 41.25    Types: Cigarettes   Smokeless tobacco: Never  Substance and Sexual Activity   Alcohol use: Never   Drug use: Never   Sexual activity: Not Currently  Other Topics Concern   Not on file  Social History Narrative   Since Dec 2022, he has been living in [place].  His ex-wife lives here and they are very close friends.  He does not routinely exercise.   Social Determinants of Health   Financial Resource Strain: Not on file  Food Insecurity:  Not on file  Transportation Needs: Not on file  Physical Activity: Not on file  Stress: Not on file  Social Connections: Not on file  Intimate Partner Violence: Not on file     Review  of Systems    General:  No chills, fever, night sweats or weight changes.  Cardiovascular:  +++ chest pain, +++ dyspnea on exertion, occasional lower extremity edema, no orthopnea, palpitations, paroxysmal nocturnal dyspnea. Dermatological: No rash, lesions/masses Respiratory: No cough, dyspnea Urologic: No hematuria, dysuria Abdominal:   No nausea, vomiting, diarrhea, bright red blood per rectum, melena, or hematemesis Neurologic:  No visual changes, wkns, changes in mental status. All other systems reviewed and are otherwise negative except as noted above.  Physical Exam    Blood pressure (!) 149/70, pulse 91, temperature 97.9 F (36.6 C), temperature source Oral, resp. rate (!) 22, height '5\' 10"'$  (1.778 m), weight 112 kg, SpO2 93 %.  General: Pleasant, NAD Psych: Normal affect. Neuro: Alert and oriented X 3. Moves all extremities spontaneously. HEENT: Normal     Neck: Supple, obese, difficult to gauge JVP.  No bruits.  Lungs:  Resp regular and unlabored, markedly diminished breath sounds bilaterally. Heart: RRR, distant, no s3, s4, or murmurs. Abdomen: Obese, soft, non-tender, non-distended, BS + x 4.  Extremities: No clubbing, cyanosis.  Trace bilateral ankle edema just above the sock line. DP/PT1+, Radials 2+ and equal bilaterally.  Labs    Cardiac Enzymes Recent Labs  Lab 10/12/21 1927 10/12/21 2106  TROPONINIHS 5,778* 5,795*      Lab Results  Component Value Date   WBC 9.2 10/13/2021   HGB 17.2 (H) 10/13/2021   HCT 52.8 (H) 10/13/2021   MCV 95.1 10/13/2021   PLT 100 (L) 10/13/2021    Recent Labs  Lab 10/12/21 1707 10/13/21 0506  NA 139 140  K 3.5 3.6  CL 101 106  CO2 30 29  BUN 15 17  CREATININE 1.09 1.04  CALCIUM 9.3 8.7*  PROT 7.2  --   BILITOT 2.7*  --   ALKPHOS  76  --   ALT 23  --   AST 59*  --   GLUCOSE 190* 197*    BNP    Component Value Date/Time   BNP 431.7 (H) 10/12/2021 1707     Radiology Studies     DG Chest Port 1 View  Result Date: 10/12/2021 CLINICAL DATA:  Shortness of breath.  History of COPD. EXAM: PORTABLE CHEST 1 VIEW COMPARISON:  None available. FINDINGS: Dual lead left-sided pacemaker in place. The heart is enlarged. Aortic atherosclerosis. There is diffuse peribronchial thickening and interstitial coarsening. Limited left basilar assessment due to soft tissue attenuation from habitus. No confluent airspace disease. No large pleural effusion. No pneumothorax. IMPRESSION: Cardiomegaly. Diffuse peribronchial thickening and interstitial coarsening, may represent pulmonary edema, acute bronchitis, or chronic lung disease. No prior exams available for comparison. Aortic Atherosclerosis (ICD10-I70.0). Electronically Signed   By: Keith Rake M.D.   On: 10/12/2021 17:39    ECG & Cardiac Imaging    Sinus tachycardia, 108, leftward axis, ? LAE, IVCD w/ nonspecific T changes - personally reviewed.  Assessment & Plan    1.  Non-STEMI/CAD:  Patient with prior history of coronary artery disease status post myocardial infarction and cardiac arrest in 2002 with stenting of the obtuse marginal.  He subsequently underwent diagnostic catheterization in 2013 which revealed moderate LAD stenosis with aneurysmal dilation distal to the stenosis as well as patent obtuse marginal stent and moderate disease distal to the stent.  He has been medically managed since then and followed by cardiology in [place], [place].  Over the past 2 months, since moving to [place], he has been experiencing rest and exertional retrosternal chest  discomfort associated with dyspnea, which has progressed in frequency and duration over the past week.  He had a prolonged episode yesterday, prompting him to present to the emergency department where he was found to have  significant elevation of troponin of 5778  5795.  He underwent diagnostic catheterization revealing severe proximal/mid aneurysmal LAD disease, with patent left circumflex stent (previously labeled obtuse marginal).  LVEDP was 40 mmHg w/ EF of <25%.  Transferred to [facility] for advanced heart failure management and potential inotropic support.  Review with interventional team and potentially CT surgical team the likely poor candidate for surgery in the setting of severe LV dysfunction.  Continue aspirin, beta-blocker, and statin therapy.  Beta-blocker dose reduced in the setting of severe LV dysfunction.  2.  Acute on chronic heart failure with reduced ejection fraction/ischemic cardiomyopathy: EF previously as low as 20% status post ICD in 2016 with subsequent treatment of LV function with April 2022 notes indicating preserved EF.  Unfortunately, in the setting of non-STEMI, patient found to have an LVEDP of 40 mmHg with subsequent left ventriculography showing an EF of less than 25%.  Given concern for low output heart failure, patient placed on intravenous Lasix and transferred to [facility] for advanced heart failure team evaluation.  Beta-blocker dose reduced.  Has been on ACE inhibitor therapy at home.  Will transition to ARB with plan to transition to [facility] if pressure tolerates.  We will add SGLT2 inhibitor and low dose spiro.  3.  Essential hypertension: Blood pressure was elevated in the emergency department.  Follow-up with medication adjustments and diuresis.  4.  Hyperlipidemia:   Patient previously intolerant to atorvastatin and simvastatin and takes Livalo at home.  Currently on pravastatin.  Follow-up lipids.  He would be willing to try rosuvastatin if lipids not at goal.    5.  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  Reports good compliance.  Last dose was yesterday morning.  This is on hold and he is currently on heparin, pending diagnostic catheterization.  Plan to resume Eliquis prior to  discharge.  6.  Thrombocytopenia: Per patient and prior notes, this is chronic.  He says that at the time of his bladder cancer diagnosis in 2013, he did require platelet transfusions but was also having massive hematuria at that time.  Follow on heparin and potential addition of antiplatelet therapy.  7.  COPD/tobacco abuse: Smoking less than half a pack a day for his entire adult life.  Complete cessation advised.  He moves very little air on exam.  Started on steroids and nebulizers at [facility].  Continue.   Signed, Murray Hodgkins, NP 10/13/2021, 1:46 PM  Patient seen with NP, agree with the above note.   History as above.  Patient admitted with NSTEMI, LHC/RHC showed markedly elevated filling pressures, marginal CI, complex proximal LAD disease with serial 90% stenoses separated by small aneurysmal areas.  Patient was transferred to [facility] for further management.    He is on IV Solumedrol for COPD.  He is orthopneic though not short of breath at rest.  No chest pain currently.    General: NAD Neck:JVP difficult, appears elevated, no thyromegaly or thyroid nodule.  Lungs: Distant BS with wheezes.  CV: Nondisplaced PMI.  Heart regular S1/S2, no S3/S4, no murmur.  1+ ankle edema.  No carotid bruit.  Difficult to palpate pedal pulses.  Abdomen: Soft, nontender, no hepatosplenomegaly, no distention.  Skin: Intact without lesions or rashes.  Neurologic: Alert and oriented x 3.  Psych:  Normal affect. Extremities: No clubbing or cyanosis.  HEENT: Normal.   1.  CAD: NSTEMI, peak troponin 5795.  Patient has history of CAD with prior PCIs.  Coronary angiography today showed complex proximal LAD disease with serial 90% stenoses separated by small aneurysmal areas.  He had a 60% mid LCx stenosis.  RCA with mild disease.  With wheezing and marked volume overload, it was decided to try to optimized his CHF and COPD prior to complex LAD intervention.  He is not thought to be a CABG candidate with severe  LV dysfunction, low PAPI, and COPD.  - Plan tentatively for complex PCI to LAD on Wednesday with Dr. Fletcher Anon.  - Will start Plavix 75 mg daily and continue ASA 81.  He will need to be on anticoagulation, so will use Plavix rather than Brilinta.  - Will start Crestor 40 mg daily.  - heparin gtt for now.  2. 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  Echo with EF 20-25%, severe LV dilation, RV mildly dysfunctional on my review of echo.  RHC today with markedly elevated filling pressures, CI 2.03, and PAPI < 1. He is wheezing on exam, this may be a combination of CHF and COPD.  Creatinine stable 1.04.  - Lasix 80 mg IV bid, start now.  - Transition from Coreg to bisoprolol 2.5 mg daily (beta-1 selective with COPD).  - Continue losartan 25 mg daily (eventually transition to [place]).  - Continue dapagliflozin 10 mg daily.  - Continue spironolactone 12.5 mg daily.  - Place PICC, follow CVP and co-ox.  - If co-ox is low, will add milrinone.  3. 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  Will cover with heparin gtt for now.  4. COPD: Distant BS, wheezing.   - Needs to quit smoking.  - Cover with solumedrol 40 mg IV bid for now, transition to prednisone eventually.  - Nebs 5. Thrombocytopenia: Mild, chronic.   Loralie Champagne 10/13/2021 5:41 PM

## 2021-10-13 NOTE — Progress Notes (Signed)
?  Transition of Care (TOC) Screening Note ? ? ?Patient Details  ?Name: Isiaah Cuervo ?Date of Birth: 1946-04-26 ? ? ?Transition of Care (TOC) CM/SW Contact:    ?Nayomi Tabron, LCSW ?Phone Number: ?10/13/2021, 5:01 PM ? ? ? ?Transition of Care Department [facility]) has reviewed patient and no TOC needs have been identified at this time. We will continue to monitor patient advancement through interdisciplinary progression rounds. Patient will benefit from PT/OT consult for disposition recommendations. If new patient transition needs arise, please place a TOC consult. ?  ?

## 2021-10-13 NOTE — Assessment & Plan Note (Addendum)
Tobacco cessation counseled. ?Continue IV Solu-Medrol. ?Continue scheduled DuoNebs, as needed albuterol nebs, Breo Ellipta and Flonase. ?Flutter valve. ?

## 2021-10-13 NOTE — Hospital Course (Addendum)
76 year old male, retired Brewing technologist, recently moved from [place] to [place] about 2.5 months ago and has no local physicians yet, medical history significant for CAD s/p PCI 2002, cardiac arrest x3, AICD, COPD, multiple PEs on Eliquis, HTN, HLD, tobacco use disorder, presented to ED on 10/12/2021 with complaints of progressively worsening chest pain and dyspnea.  Reports about 1 to 2 months of intermittent chest pain, does not use NTG at home, on night of admission chest pain became constant, across mid anterior chest, pressure/tightness like, associated with dyspnea and diaphoresis.  Admitted for NSTEMI.  Troponin peaked to 5795.  Given aspirin, started on IV heparin.  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  Cardiology initiated him on IV Lasix, recommend transferring him to [facility] under the advanced heart failure service.  They have contacted their partners at [facility] who have accepted.  He may need inotropic support.  He may be a candidate for PCI after stabilization of his CHF. ? ?

## 2021-10-13 NOTE — Assessment & Plan Note (Addendum)
Eliquis on hold and continue IV heparin and defer to AHF team for further management. ?

## 2021-10-13 NOTE — Assessment & Plan Note (Signed)
Please see above under NSTEMI. ?

## 2021-10-13 NOTE — Discharge Summary (Signed)
Physician Discharge Summary  Paul Bullock ZOX:[id number] DOB: 05/02/1946  PCP: Pcp, No  Admitted from: Home Discharged to: [place] date: 10/12/2021 Discharge date: 10/13/2021  Recommendations for Outpatient Follow-up:    Follow-up Information     Paul [place], MD. Schedule an appointment as soon as possible for a visit.   Specialty: Cardiology Contact information: [place] [address] [phone]                  Home Health: None    Equipment/Devices: None    Discharge Condition: Guarded   Code Status: Full Code Diet recommendation:  Discharge Diet Orders (From admission, onward)     Start     Ordered   10/13/21 0000  Diet - low sodium heart healthy        10/13/21 1318             Discharge Diagnoses:  Principal Problem:   Shortness of breath Active Problems:   NSTEMI (non-ST elevated myocardial infarction) ([place])   COPD with acute exacerbation (HCC)   Acute systolic CHF (congestive heart failure) (HCC)   History of pulmonary embolus (PE)   Essential hypertension   Hyperlipidemia   CAD (coronary artery disease)   Rectal bleeding   Tobacco dependence   Thrombocytopenia (HCC)   Polycythemia   Hyperglycemia   Brief Summary: 76 year old male, retired Brewing technologist, recently moved from [place] to [place] about 2.5 months ago and has no local physicians yet, medical history significant for CAD s/p PCI 04-Nov-2000, cardiac arrest x3, AICD, COPD, multiple PEs on Eliquis, HTN, HLD, tobacco use disorder, presented to ED on 10/12/2021 with complaints of progressively worsening chest pain and dyspnea.  Reports about 1 to 2 months of intermittent chest pain, does not use NTG at home, on night of admission chest pain became constant, across mid anterior chest, pressure/tightness like, associated with dyspnea and diaphoresis.  Admitted for NSTEMI.  Troponin peaked to 5795.  Given aspirin, started on  IV heparin.  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  Cardiology initiated him on IV Lasix, recommend transferring him to [facility] under the advanced heart failure service.  They have contacted their partners at [facility] who have accepted.  He may need inotropic support.  He may be a candidate for PCI after stabilization of his CHF.   Assessment and Plan: NSTEMI (non-ST elevated myocardial infarction) (Paul Bullock) CAD s/p PCI 11-04-00, history of MI, coded x3, AICD Presented with chest pain and dyspnea, similar to when he had prior MI. Chest pain ongoing for approximately 1 to 2 months, worsened night of admission. HS troponin peaked to 5795. SL NTG given did not help and patient does not use NTG at home. Requested medical records from [place]. Continue aspirin, IV heparin, carvedilol, dose reduced and pravastatin has been changed to high intensity statin. 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  Cardiology initiated him on IV Lasix, recommend transferring him to [facility] under the advanced heart failure service.  They have contacted their partners at [facility] who have accepted.  He may need inotropic support.  He may be a candidate for PCI after stabilization of his CHF.  Acute systolic CHF (congestive heart failure) (Paul Bullock) Per cath, elevated right and left heart pressures Initiated on IV Lasix 40 mg every 12. Being transferred to [facility] under the AHF team and may need inotropic support.  COPD with acute exacerbation (Paul Bullock) Tobacco cessation counseled. Continue IV  Solu-Medrol. Continue scheduled DuoNebs, as needed albuterol nebs, Breo Ellipta and Flonase. Flutter valve.  History of pulmonary embolus (PE) Eliquis on hold and continue IV heparin and defer to AHF team for further management.  Essential hypertension Lisinopril has been discontinued due to need for aggressive diuresis and risk of hypotension Carvedilol dose has been reduced.  Hyperlipidemia Pravastatin has been changed to high intensity statin.  CAD (coronary artery disease) Please see above under NSTEMI.  Rectal bleeding Hemorrhoidal bleeding Last colonoscopy may be in 2017 and told to be unremarkable. Has had intermittent varying amounts of rectal bleeding for decades.  Reportedly no rectal bleeding for the last couple of days. Was due for colonoscopy may be in 2021, encouraged him to pursue this as outpatient. Bowel regimen and monitor closely.  Tobacco dependence Cessation counseled. Patient declines nicotine patch.  Thrombocytopenia (HCC) Stable in the 100 range.  Baseline unknown. Monitor CBCs daily.  Polycythemia Suspect polycythemia May be related to COPD and tobacco use disorder. Follow CBC.  Hyperglycemia Check A1c.       Body mass index is 35.44 kg/m.  Nutritional Status        Pressure Ulcer:    Consultations: Cardiology  Procedures: Cardiac cath 3/10   Discharge Instructions  Discharge Instructions     (HEART FAILURE PATIENTS) Call MD:  Anytime you have any of the following symptoms: 1) 3 pound weight gain in 24 hours or 5 pounds in 1 week 2) shortness of breath, with or without a dry hacking cough 3) swelling in the hands, feet or stomach 4) if you have to sleep on extra pillows at night in order to breathe.   Complete by: As directed    Call MD for:  difficulty breathing, headache or visual disturbances   Complete by: As directed    Call MD for:  extreme fatigue   Complete by: As directed    Call MD for:  persistant dizziness  or light-headedness   Complete by: As directed    Call MD for:  severe uncontrolled pain   Complete by: As directed    Diet - low sodium heart healthy   Complete by: As directed    Increase activity slowly   Complete by: As directed         Medication List     STOP taking these medications    apixaban 5 MG Tabs tablet Commonly known as: ELIQUIS   furosemide 20 MG tablet Commonly known as: LASIX Replaced by: furosemide 10 MG/ML injection   lisinopril 10 MG tablet Commonly known as: ZESTRIL   nystatin 100000 UNIT/ML suspension Commonly known as: MYCOSTATIN   Pitavastatin Calcium 4 MG Tabs Replaced by: pravastatin 80 MG tablet       TAKE these medications    albuterol (2.5 MG/3ML) 0.083% nebulizer solution Commonly known as: PROVENTIL Take 2.5 mg by nebulization every 6 (six) hours as needed for wheezing or shortness of breath.   aspirin 81 MG chewable tablet Chew 1 tablet (81 mg total) by mouth daily. Start taking on: October 14, 2021   carvedilol 12.5 MG tablet Commonly known as: COREG Take 0.5 tablets (6.25 mg  total) by mouth 2 (two) times daily. What changed: how much to take   Cyanocobalamin 2500 MCG Tabs Take 2,500 mcg by mouth daily.   fluticasone 50 MCG/ACT nasal spray Commonly known as: FLONASE Place 2 sprays into both nostrils daily.   fluticasone furoate-vilanterol 100-25 MCG/ACT Aepb Commonly known as: BREO ELLIPTA Inhale 1 puff into the lungs in the morning.   furosemide 10 MG/ML injection Commonly known as: LASIX Inject 4 mLs (40 mg total) into the vein 2 (two) times daily. Replaces: furosemide 20 MG tablet   heparin 25000 UT/250ML infusion Inject 1,550 Units/hr into the vein continuous.   ipratropium-albuterol 0.5-2.5 (3) MG/3ML Soln Commonly known as: DUONEB Take 3 mLs by nebulization 3 (three) times daily.   methylPREDNISolone sodium succinate 40 mg/mL injection Commonly known as: SOLU-MEDROL Inject 1 mL (40 mg total) into the  vein every 12 (twelve) hours.   morphine (PF) 2 MG/ML injection Inject 0.5 mLs (1 mg total) into the vein every 4 (four) hours as needed (Moderate pain, severe pain.).   multivitamin with minerals tablet Take 1 tablet by mouth daily.   nitroGLYCERIN 0.4 MG SL tablet Commonly known as: NITROSTAT Place 1 tablet (0.4 mg total) under the tongue every 5 (five) minutes as needed for chest pain.   polyethylene glycol 17 g packet Commonly known as: MIRALAX / GLYCOLAX Take 17 g by mouth daily. Start taking on: October 14, 2021   pravastatin 80 MG tablet Commonly known as: PRAVACHOL Take 1 tablet (80 mg total) by mouth daily at 6 PM. Replaces: Pitavastatin Calcium 4 MG Tabs   senna-docusate 8.6-50 MG tablet Commonly known as: Senokot-S Take 1 tablet by mouth daily. Start taking on: October 14, 2021   Vitamin D-3 125 MCG (5000 UT) Tabs Take 5,000 Units by mouth daily.       No Known Allergies    Procedures/Studies: CARDIAC CATHETERIZATION  Result Date: 10/13/2021   Ost LM to Mid LM lesion is 30% stenosed.   Prox LAD to Mid LAD lesion is 90% stenosed.   Mid LAD lesion is 90% stenosed.   LPAV lesion is 60% stenosed.   Prox Cx to Mid Cx lesion is 20% stenosed.   There is severe left ventricular systolic dysfunction.   LV end diastolic pressure is severely elevated.   The left ventricular ejection fraction is less than 25% by visual estimate. 1.  Codominant coronary arteries with severe proximal and mid LAD disease with 2 aneurysmal areas in the diseased segment.  Patent left circumflex stent with mild in-stent restenosis. 2.  Severely reduced LV systolic function with an EF of 10%.  Severely elevated left ventricular end-diastolic pressure at 38 mmHg. 3.  Right heart catheterization was performed due to findings of low EF and elevated LVEDP.  It showed severely elevated right and left-sided filling pressures, severe pulmonary hypertension and mildly reduced cardiac output.  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 Recommendations: The patient is pre cardiogenic shock with severe volume overload.  Recommend transfer to [facility] advanced heart failure for management.  I suspect that he will require inotropic support while diuresing him. Resume heparin at 5:30 PM without bolus. I decreased the dose of carvedilol by half. Management of his LAD disease will be challenging.  I do not think he is a surgical candidate and thus his best option is likely with high risk PCI if doable after optimizing his heart failure.  The LAD is moderately to severely calcified  but likely not able to use atherectomy given the presence of aneurysmal areas.   DG Chest Port 1 View  Result Date: 10/12/2021 CLINICAL DATA:  Shortness of breath.  History of COPD. EXAM: PORTABLE CHEST 1 VIEW COMPARISON:  None available. FINDINGS: Dual lead left-sided pacemaker in place. The heart is enlarged. Aortic atherosclerosis. There is diffuse peribronchial thickening and interstitial coarsening. Limited left basilar assessment due to soft tissue attenuation from habitus. No confluent airspace disease. No large pleural effusion. No pneumothorax. IMPRESSION: Cardiomegaly. Diffuse peribronchial thickening and interstitial coarsening, may represent pulmonary edema, acute bronchitis, or chronic lung disease. No prior exams available for comparison. Aortic Atherosclerosis (ICD10-I70.0). Electronically Signed   By: Keith Rake M.D.   On: 10/12/2021 17:39      Subjective: Patient seen this morning prior to cardiac cath. Seen this morning while still in the ED.  Reports no further chest pain or dyspnea since approximately 3 AM.  States that the sublingual NTG that he received in the hospital did not really make any difference to his chest pain or dyspnea.  Indicates that he continues to smoke about 15 cigarettes/day.  Had moved from [place] state to [place] in 2018 and had all his  providers there until he moved here a couple of months ago and is looking for a PCP, cardiologist and urologist  Discharge Exam:  Vitals:   10/13/21 1246 10/13/21 1258 10/13/21 1315 10/13/21 1326  BP: 128/80 120/67  105/64  Pulse: 85 87 88 82  Resp: 18 (!) 22 (!) 36 (!) 37  Temp:      TempSrc:      SpO2: 94% 95% 96% 94%  Weight:      Height:        General exam: Elderly male, moderately built and obese sitting up comfortably in bed without distress. Respiratory system: Harsh and diminished breath sounds bilaterally with scattered bilateral few expiratory rhonchi.  No crackles.  No increased work of breathing. Cardiovascular system: S1 & S2 heard, RRR. No JVD, murmurs, rubs, gallops or clicks. No pedal edema. Gastrointestinal system: Abdomen is nondistended but protuberant/obese, soft and nontender. No organomegaly or masses felt. Normal bowel sounds heard. Central nervous system: Alert and oriented. No focal neurological deficits. Extremities: Symmetric 5 x 5 power. Skin: No rashes, lesions or ulcers Psychiatry: Judgement and insight appear normal. Mood & affect appropriate.     The results of significant diagnostics from this hospitalization (including imaging, microbiology, ancillary and laboratory) are listed below for reference.     Microbiology: Recent Results (from the past 240 hour(s))  Resp Panel by RT-PCR (Flu A&B, Covid) Nasopharyngeal Swab     Status: None   Collection Time: 10/12/21  5:19 PM   Specimen: Nasopharyngeal Swab; Nasopharyngeal(NP) swabs in vial transport medium  Result Value Ref Range Status   SARS Coronavirus 2 by RT PCR NEGATIVE NEGATIVE Final    Comment: (NOTE) SARS-CoV-2 target nucleic acids are NOT DETECTED.  The SARS-CoV-2 RNA is generally detectable in upper respiratory specimens during the acute phase of infection. The lowest concentration of SARS-CoV-2 viral copies this assay can detect is 138 copies/mL. A negative result does not preclude  SARS-Cov-2 infection and should not be used as the sole basis for treatment or other patient management decisions. A negative result may occur with  improper specimen collection/handling, submission of specimen other than nasopharyngeal swab, presence of viral mutation(s) within the areas targeted by this assay, and inadequate number of viral copies(<138 copies/mL). A negative result must be combined  with clinical observations, patient history, and epidemiological information. The expected result is Negative.  Fact Sheet for Patients:  EntrepreneurPulse.com.au  Fact Sheet for Healthcare Providers:  IncredibleEmployment.be  This test is no t yet approved or cleared by the [place] FDA and  has been authorized for detection and/or diagnosis of SARS-CoV-2 by FDA under an Emergency Use Authorization (EUA). This EUA will remain  in effect (meaning this test can be used) for the duration of the COVID-19 declaration under Section 564(b)(1) of the Act, 21 U.S.C.section 360bbb-3(b)(1), unless the authorization is terminated  or revoked sooner.       Influenza A by PCR NEGATIVE NEGATIVE Final   Influenza B by PCR NEGATIVE NEGATIVE Final    Comment: (NOTE) The Xpert Xpress SARS-CoV-2/FLU/RSV plus assay is intended as an aid in the diagnosis of influenza from Nasopharyngeal swab specimens and should not be used as a sole basis for treatment. Nasal washings and aspirates are unacceptable for Xpert Xpress SARS-CoV-2/FLU/RSV testing.  Fact Sheet for Patients: EntrepreneurPulse.com.au  Fact Sheet for Healthcare Providers: IncredibleEmployment.be  This test is not yet approved or cleared by the [place] FDA and has been authorized for detection and/or diagnosis of SARS-CoV-2 by FDA under an Emergency Use Authorization (EUA). This EUA will remain in effect (meaning this test can be used) for the duration of  the COVID-19 declaration under Section 564(b)(1) of the Act, 21 U.S.C. section 360bbb-3(b)(1), unless the authorization is terminated or revoked.  Performed at [facility], [address] [place], [place] [address]      Labs: CBC: Recent Labs  Lab 10/12/21 1707 10/13/21 0506  WBC 9.1 9.2  NEUTROABS 7.0  --   HGB 18.5* 17.2*  HCT 54.8* 52.8*  MCV 92.3 95.1  PLT 110* 100*    Basic Metabolic Panel: Recent Labs  Lab 10/12/21 1707 10/12/21 1927 10/13/21 0506  NA 139  --  140  K 3.5  --  3.6  CL 101  --  106  CO2 30  --  29  GLUCOSE 190*  --  197*  BUN 15  --  17  CREATININE 1.09  --  1.04  CALCIUM 9.3  --  8.7*  MG  --  1.9  --     Liver Function Tests: Recent Labs  Lab 10/12/21 1707  AST 59*  ALT 23  ALKPHOS 76  BILITOT 2.7*  PROT 7.2  ALBUMIN 3.6      Time coordinating discharge: 35 minutes  SIGNED:  Vernell Leep, MD,  FACP, [facility], [facility], [facility] (Care Management Physician Certified). [facility] Advisor  To contact the attending provider between 7A-7P or the covering provider during after hours 7P-7A, please log into the web site [email or web address] and access using universal [place] password for that web site. If you do not have the password, please call the hospital operator.

## 2021-10-13 NOTE — Assessment & Plan Note (Addendum)
Pravastatin has been changed to high intensity statin. ?

## 2021-10-13 NOTE — Assessment & Plan Note (Addendum)
Lisinopril has been discontinued due to need for aggressive diuresis and risk of hypotension ?Carvedilol dose has been reduced. ?

## 2021-10-13 NOTE — Assessment & Plan Note (Signed)
Check A1c. 

## 2021-10-13 NOTE — Assessment & Plan Note (Signed)
Suspect polycythemia ?May be related to COPD and tobacco use disorder. ?Follow CBC. ?

## 2021-10-13 NOTE — Assessment & Plan Note (Signed)
Cessation counseled. ?Patient declines nicotine patch. ?

## 2021-10-13 NOTE — Consult Note (Signed)
ANTICOAGULATION CONSULT NOTE  ? ?Pharmacy Consult for heparin ?Indication: chest pain/ACS ? ?No Known Allergies ? ?Patient Measurements: ?Height: '5\' 10"'$  (177.8 cm) ?Weight: 112 kg (247 lb) ?IBW/kg (Calculated) : 73 ?Heparin Dosing Weight: 97.2 kg ? ?Vital Signs: ?BP: 149/70 (03/10 0330) ?Pulse Rate: 91 (03/10 0330) ? ?Labs: ?Recent Labs  ?  10/12/21 ?1707 10/12/21 ?1927 10/12/21 ?2106 10/13/21 ?9417  ?HGB 18.5*  --   --  17.2*  ?HCT 54.8*  --   --  52.8*  ?PLT 110*  --   --  100*  ?APTT  --   --  27 54*  ?LABPROT  --   --  14.4  --   ?INR  --   --  1.1  --   ?HEPARINUNFRC  --   --  1.09* 0.64  ?CREATININE 1.09  --   --  1.04  ?TROPONINIHS  --  4,081* 5,795*  --   ? ? ? ?Estimated Creatinine Clearance: 79.3 mL/min (by C-G formula based on SCr of 1.04 mg/dL). ? ? ?Medical History: ?Past Medical History:  ?Diagnosis Date  ? CHF (congestive heart failure) ([place])   ? COPD (chronic obstructive pulmonary disease) ([place])   ? MI (myocardial infarction) ([place])   ? ? ?Medications:  ?(Not in a hospital admission) ?Scheduled:  ? carvedilol  12.5 mg Oral BID  ? cholecalciferol  5,000 Units Oral Daily  ? fluticasone furoate-vilanterol  1 puff Inhalation q AM  ? ipratropium-albuterol  3 mL Nebulization TID  ? lisinopril  10 mg Oral Daily  ? multivitamin with minerals  1 tablet Oral Daily  ? pravastatin  80 mg Oral q1800  ? vitamin B-12  2,500 mcg Oral Daily  ? ?Infusions:  ? heparin 1,300 Units/hr (10/12/21 2120)  ? ?PRN: acetaminophen **OR** acetaminophen, albuterol, fluticasone, morphine injection, nitroGLYCERIN, ondansetron **OR** ondansetron (ZOFRAN) IV, polyethylene glycol ?Anti-infectives (From admission, onward)  ? ? None  ? ?  ? ? ?Assessment: ?Pharmacy consulted to start heparin for ACS. Trop elevated 5.7K. Pt takes apixaban PTA, unsure of the indication. Baseline labs ordered. Will use aPTT to monitor heparin if anti-xa level is falsely elevated. Last dose of apixaban was 10/12/21 AM ? ?Goal of Therapy:  ?aPTT 66-102  seconds ?Heparin level 0.3-0.7 units/ml once aPTT and heparin level correlate.  ?Monitor platelets by anticoagulation protocol: Yes ? ?3/09 2106 aPTT 27, HL 1.09 (Baseline) ?3/10 0506 aPTT 54, HL 0.64 ?  ?Plan:  ?aPTT subtherapeutic ?Give 2900 units bolus x 1 ?Increase heparin infusion to 1550 units/hr ?Recheck aPTT level in 8 hours after rate change and heparin level daily while on heparin. ?Continue to monitor H&H and platelets ? ?Renda Rolls, PharmD, MBA ?10/13/2021 ?5:59 AM ? ? ? ?

## 2021-10-13 NOTE — Progress Notes (Signed)
Admission profile updated. ?

## 2021-10-13 NOTE — Assessment & Plan Note (Signed)
Hemorrhoidal bleeding ?Last colonoscopy may be in 2017 and told to be unremarkable. ?Has had intermittent varying amounts of rectal bleeding for decades.  Reportedly no rectal bleeding for the last couple of days. ?Was due for colonoscopy may be in 2021, encouraged him to pursue this as outpatient. ?Bowel regimen and monitor closely. ?

## 2021-10-13 NOTE — Assessment & Plan Note (Signed)
Stable in the 100 range.  Baseline unknown. ?Monitor CBCs daily. ?

## 2021-10-13 NOTE — Progress Notes (Addendum)
PROGRESS NOTE   Paul Bullock  VHQ:[id number]    DOB: June 17, 1946    DOA: 10/12/2021  PCP: Pcp, No   I have briefly reviewed patients previous medical records in [facility].  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  Reports about 1 to 2 months of intermittent chest pain, does not use NTG at home, on night of admission chest pain became constant, across mid anterior chest, pressure/tightness like, associated with dyspnea and diaphoresis.  Admitted for NSTEMI.  Troponin peaked to 5795.  Given aspirin, started on IV heparin, cardiology consulted and input pending.   Assessment & Plan:  Assessment and Plan: NSTEMI (non-ST elevated myocardial infarction) ([place]) CAD s/p PCI 15-Nov-2000, history of MI, coded x3, AICD Presented with chest pain and dyspnea, similar to when he had prior MI. Chest pain ongoing for approximately 1 to 2 months, worsened night of admission. HS troponin peaked to 5795. SL NTG given did not help and patient does not use NTG at home. Requested medical records from [place]. Continue aspirin, IV heparin, carvedilol and pravastatin. Cardiology/[facility] heart [facility] were consulted and their input is pending. Patient currently n.p.o. for possible cath. Although 2D echo reportedly done in [place] state November 22 was reported as normal, will repeat 2D echo to assess LV function at this time.  COPD with acute exacerbation ([place]) Tobacco cessation counseled. Continue IV Solu-Medrol. Continue scheduled DuoNebs, as needed albuterol nebs, Breo Ellipta and Flonase. Flutter  valve.  History of pulmonary embolus (PE) Eliquis on hold and bridging with IV heparin while awaiting possible cath and intervention.  Essential hypertension Continue lisinopril and carvedilol.  Hyperlipidemia Continue pravastatin.  CAD (coronary artery disease) Please see above under NSTEMI.  Rectal bleeding Hemorrhoidal bleeding Last colonoscopy may be in Nov 16, 2015 and told to be unremarkable. Has had intermittent varying amounts of rectal bleeding for decades.  Reportedly no rectal bleeding for the last couple of days. Was due for colonoscopy may be in 2019-11-16, encouraged him to pursue this as outpatient. Bowel regimen and monitor closely.  Tobacco dependence Cessation counseled. Patient declines nicotine patch.  Thrombocytopenia (HCC) Stable in the 100 range.  Baseline unknown. Monitor CBCs daily.  Polycythemia Suspect polycythemia May be related to COPD and tobacco use disorder. Follow CBC.  Hyperglycemia Check A1c.      Body mass index is 35.44 kg/m. Nutritional Status       Pressure Ulcer:    DVT prophylaxis: Place TED hose Start: 10/12/21 1900     Code Status: Full Code:  Family Communication: None at bedside. Disposition:  Status is: Observation The patient will require care spanning > 2 midnights and should be moved to inpatient because: Severity of presentation, IV meds, need for further evaluation including possible cath and intervention.    Consultants:   Cardiology  Procedures:     Antimicrobials:      Subjective:  Seen this morning while still in the ED.  Reports no further chest pain or dyspnea since approximately 3 AM.  States that the sublingual NTG that he received in the hospital did not really make any difference to his  chest pain or dyspnea.  Indicates that he continues to smoke about 15 cigarettes/day.  Had moved from [place] to [place] in 2018 and had all his providers there until he moved here a couple of months ago and is  looking for a PCP, cardiologist and urologist  Objective:   Vitals:   10/13/21 0100 10/13/21 0130 10/13/21 0200 10/13/21 0330  BP: 138/80 128/80 109/65 (!) 149/70  Pulse: 79 100 81 91  Resp:  17  (!) 22  Temp:      TempSrc:      SpO2: 90% 97% 93% 93%  Weight:      Height:        General exam: Elderly male, moderately built and obese sitting up comfortably in bed without distress. Respiratory system: Harsh and diminished breath sounds bilaterally with scattered bilateral few expiratory rhonchi.  No crackles.  No increased work of breathing. Cardiovascular system: S1 & S2 heard, RRR. No JVD, murmurs, rubs, gallops or clicks. No pedal edema. Gastrointestinal system: Abdomen is nondistended but protuberant/obese, soft and nontender. No organomegaly or masses felt. Normal bowel sounds heard. Central nervous system: Alert and oriented. No focal neurological deficits. Extremities: Symmetric 5 x 5 power. Skin: No rashes, lesions or ulcers Psychiatry: Judgement and insight appear normal. Mood & affect appropriate.     Data Reviewed:   I have personally reviewed following labs and imaging studies   CBC: Recent Labs  Lab 10/12/21 1707 10/13/21 0506  WBC 9.1 9.2  NEUTROABS 7.0  --   HGB 18.5* 17.2*  HCT 54.8* 52.8*  MCV 92.3 95.1  PLT 110* 100*    Basic Metabolic Panel: Recent Labs  Lab 10/12/21 1707 10/12/21 1927 10/13/21 0506  NA 139  --  140  K 3.5  --  3.6  CL 101  --  106  CO2 30  --  29  GLUCOSE 190*  --  197*  BUN 15  --  17  CREATININE 1.09  --  1.04  CALCIUM 9.3  --  8.7*  MG  --  1.9  --     Liver Function Tests: Recent Labs  Lab 10/12/21 1707  AST 59*  ALT 23  ALKPHOS 76  BILITOT 2.7*  PROT 7.2  ALBUMIN 3.6    CBG: No results for input(s): GLUCAP in the last 168 hours.  Microbiology Studies:   Recent Results (from the past 240 hour(s))  Resp Panel by RT-PCR (Flu A&B, Covid) Nasopharyngeal Swab     Status: None   Collection Time: 10/12/21   5:19 PM   Specimen: Nasopharyngeal Swab; Nasopharyngeal(NP) swabs in vial transport medium  Result Value Ref Range Status   SARS Coronavirus 2 by RT PCR NEGATIVE NEGATIVE Final    Comment: (NOTE) SARS-CoV-2 target nucleic acids are NOT DETECTED.  The SARS-CoV-2 RNA is generally detectable in upper respiratory specimens during the acute phase of infection. The lowest concentration of SARS-CoV-2 viral copies this assay can detect is 138 copies/mL. A negative result does not preclude SARS-Cov-2 infection and should not be used as the sole basis for treatment or other patient management decisions. A negative result may occur with  improper specimen collection/handling, submission of specimen other than nasopharyngeal swab, presence of viral mutation(s) within the areas targeted by this assay, and inadequate number of viral copies(<138 copies/mL). A negative result must be combined with clinical observations, patient history, and epidemiological information. The expected result is Negative.  Fact Sheet for Patients:  EntrepreneurPulse.com.au  Fact Sheet for Healthcare  Providers:  IncredibleEmployment.be  This test is no t yet approved or cleared by the [place] and  has been authorized for detection and/or diagnosis of SARS-CoV-2 by FDA under an Emergency Use Authorization (EUA). This EUA will remain  in effect (meaning this test can be used) for the duration of the COVID-19 declaration under Section 564(b)(1) of the Act, 21 U.S.C.section 360bbb-3(b)(1), unless the authorization is terminated  or revoked sooner.       Influenza A by PCR NEGATIVE NEGATIVE Final   Influenza B by PCR NEGATIVE NEGATIVE Final    Comment: (NOTE) The Xpert Xpress SARS-CoV-2/FLU/RSV plus assay is intended as an aid in the diagnosis of influenza from Nasopharyngeal swab specimens and should not be used as a sole basis for treatment. Nasal washings and aspirates  are unacceptable for Xpert Xpress SARS-CoV-2/FLU/RSV testing.  Fact Sheet for Patients: EntrepreneurPulse.com.au  Fact Sheet for Healthcare Providers: IncredibleEmployment.be  This test is not yet approved or cleared by the [place] FDA and has been authorized for detection and/or diagnosis of SARS-CoV-2 by FDA under an Emergency Use Authorization (EUA). This EUA will remain in effect (meaning this test can be used) for the duration of the COVID-19 declaration under Section 564(b)(1) of the Act, 21 U.S.C. section 360bbb-3(b)(1), unless the authorization is terminated or revoked.  Performed at [facility], [address], [place], [place] [address]     Radiology Studies:  [facility] Chest [place] 1 View  Result Date: 10/12/2021 CLINICAL DATA:  Shortness of breath.  History of COPD. EXAM: PORTABLE CHEST 1 VIEW COMPARISON:  None available. FINDINGS: Dual lead left-sided pacemaker in place. The heart is enlarged. Aortic atherosclerosis. There is diffuse peribronchial thickening and interstitial coarsening. Limited left basilar assessment due to soft tissue attenuation from habitus. No confluent airspace disease. No large pleural effusion. No pneumothorax. IMPRESSION: Cardiomegaly. Diffuse peribronchial thickening and interstitial coarsening, may represent pulmonary edema, acute bronchitis, or chronic lung disease. No prior exams available for comparison. Aortic Atherosclerosis (ICD10-I70.0). Electronically Signed   By: Keith Rake M.D.   On: 10/12/2021 17:39    Scheduled Meds:    carvedilol  12.5 mg Oral BID   cholecalciferol  5,000 Units Oral Daily   fluticasone furoate-vilanterol  1 puff Inhalation q AM   ipratropium-albuterol  3 mL Nebulization TID   lisinopril  10 mg Oral Daily   multivitamin with minerals  1 tablet Oral Daily   pravastatin  80 mg Oral q1800   vitamin B-12  2,500 mcg Oral Daily    Continuous Infusions:    heparin  1,550 Units/hr (10/13/21 0617)     LOS: 0 days     Vernell Leep, MD,  FACP, [facility], [facility], [facility] (Care Management Physician Certified) [place]  To contact the attending provider between 7A-7P or the covering provider during after hours 7P-7A, please log into the web site [email or web address] and access using universal  password for that web site. If you do not have the password, please call the hospital operator.  10/13/2021, 8:17 AM

## 2021-10-14 LAB — CBC WITH DIFFERENTIAL/PLATELET
Abs Immature Granulocytes: 0.1 10*3/uL — ABNORMAL HIGH (ref 0.00–0.07)
Basophils Absolute: 0 10*3/uL (ref 0.0–0.1)
Basophils Relative: 0 %
Eosinophils Absolute: 0 10*3/uL (ref 0.0–0.5)
Eosinophils Relative: 0 %
HCT: 50.3 % (ref 39.0–52.0)
Hemoglobin: 16.9 g/dL (ref 13.0–17.0)
Immature Granulocytes: 1 %
Lymphocytes Relative: 5 %
Lymphs Abs: 0.8 10*3/uL (ref 0.7–4.0)
MCH: 31.9 pg (ref 26.0–34.0)
MCHC: 33.6 g/dL (ref 30.0–36.0)
MCV: 95.1 fL (ref 80.0–100.0)
Monocytes Absolute: 1.1 10*3/uL — ABNORMAL HIGH (ref 0.1–1.0)
Monocytes Relative: 8 %
Neutro Abs: 12.2 10*3/uL — ABNORMAL HIGH (ref 1.7–7.7)
Neutrophils Relative %: 86 %
Platelets: 96 10*3/uL — ABNORMAL LOW (ref 150–400)
RBC: 5.29 MIL/uL (ref 4.22–5.81)
RDW: 12.2 % (ref 11.5–15.5)
WBC: 14.2 10*3/uL — ABNORMAL HIGH (ref 4.0–10.5)
nRBC: 0 % (ref 0.0–0.2)

## 2021-10-14 LAB — BASIC METABOLIC PANEL
Anion gap: 9 (ref 5–15)
BUN: 24 mg/dL — ABNORMAL HIGH (ref 8–23)
CO2: 30 mmol/L (ref 22–32)
Calcium: 9 mg/dL (ref 8.9–10.3)
Chloride: 98 mmol/L (ref 98–111)
Creatinine, Ser: 1.2 mg/dL (ref 0.61–1.24)
GFR, Estimated: >60 mL/min (ref >60)
Glucose, Bld: 209 mg/dL — ABNORMAL HIGH (ref 70–99)
Potassium: 4.1 mmol/L (ref 3.5–5.1)
Sodium: 137 mmol/L (ref 135–145)

## 2021-10-14 LAB — APTT
aPTT: 49 seconds — ABNORMAL HIGH (ref 24–36)
aPTT: 54 seconds — ABNORMAL HIGH (ref 24–36)

## 2021-10-14 LAB — LIPID PANEL
Cholesterol: 128 mg/dL (ref 0–200)
HDL: 48 mg/dL (ref >40)
LDL Cholesterol: 59 mg/dL (ref 0–99)
Total CHOL/HDL Ratio: 2.7 RATIO
Triglycerides: 107 mg/dL (ref <150)
VLDL: 21 mg/dL (ref 0–40)

## 2021-10-14 LAB — HEPARIN LEVEL (UNFRACTIONATED): Heparin Unfractionated: 0.4 IU/mL (ref 0.30–0.70)

## 2021-10-14 LAB — HEMOGLOBIN A1C
Hgb A1c MFr Bld: 7 % — ABNORMAL HIGH (ref 4.8–5.6)
Mean Plasma Glucose: 154 mg/dL

## 2021-10-14 MED ORDER — HEPARIN BOLUS VIA INFUSION
3000.0000 [IU] | Freq: Once | INTRAVENOUS | Status: AC
  Administered 2021-10-14: 3000 [IU] via INTRAVENOUS
  Filled 2021-10-14: qty 3000

## 2021-10-14 NOTE — Progress Notes (Signed)
Advanced Heart Failure Rounding Note   Subjective:    Diuresed well. Breathing improved with lasix and IV solumedrol.  Denies CP or SOB.   On heparin. No bleeding   Objective:   Weight Range:  Vital Signs:   Temp:  [96.6 F (35.9 C)] 96.6 F (35.9 C) (03/11 0700) Pulse Rate:  [67-104] 84 (03/11 0915) Resp:  [15-37] 34 (03/11 0915) BP: (85-135)/(46-96) 114/70 (03/11 0845) SpO2:  [86 %-100 %] 86 % (03/11 0915) Weight:  [112.3 kg-118.4 kg] 112.3 kg (03/11 1127) Last BM Date :  (PTA)  Weight change: Filed Weights   10/13/21 1618 10/14/21 0500 10/14/21 1127  Weight: 116.2 kg 118.4 kg 112.3 kg    Intake/Output:   Intake/Output Summary (Last 24 hours) at 10/14/2021 1146 Last data filed at 10/14/2021 1000 Gross per 24 hour  Intake 575.15 ml  Output 1950 ml  Net -1374.85 ml     Physical Exam: General:  Obese No resp difficulty HEENT: normal Neck: supple. JVP . Carotids 2+ bilat; no bruits. No lymphadenopathy or thryomegaly appreciated. Cor: PMI nondisplaced. Regular rate & rhythm. No rubs, gallops or murmurs. Lungs: clear Abdomen: soft, nontender, nondistended. No hepatosplenomegaly. No bruits or masses. Good bowel sounds. Extremities: no cyanosis, clubbing, rash, edema Neuro: alert & orientedx3, cranial nerves grossly intact. moves all 4 extremities w/o difficulty. Affect pleasant  Telemetry: Sinus 80-90 Personally reviewed  Labs: Basic Metabolic Panel: Recent Labs  Lab 10/12/21 1707 10/12/21 1927 10/13/21 0506 10/14/21 0504  NA 139  --  140 137  K 3.5  --  3.6 4.1  CL 101  --  106 98  CO2 30  --  29 30  GLUCOSE 190*  --  197* 209*  BUN 15  --  17 24*  CREATININE 1.09  --  1.04 1.20  CALCIUM 9.3  --  8.7* 9.0  MG  --  1.9  --   --     Liver Function Tests: Recent Labs  Lab 10/12/21 1707  AST 59*  ALT 23  ALKPHOS 76  BILITOT 2.7*  PROT 7.2  ALBUMIN 3.6   No results for input(s): LIPASE, AMYLASE in the last 168 hours. No results for  input(s): AMMONIA in the last 168 hours.  CBC: Recent Labs  Lab 10/12/21 1707 10/13/21 0506 10/14/21 0504  WBC 9.1 9.2 14.2*  NEUTROABS 7.0  --  12.2*  HGB 18.5* 17.2* 16.9  HCT 54.8* 52.8* 50.3  MCV 92.3 95.1 95.1  PLT 110* 100* 96*    Cardiac Enzymes: No results for input(s): CKTOTAL, CKMB, CKMBINDEX, TROPONINI in the last 168 hours.  BNP: BNP (last 3 results) Recent Labs    10/12/21 1707  BNP 431.7*    ProBNP (last 3 results) No results for input(s): PROBNP in the last 8760 hours.    Other results:  Imaging: CARDIAC CATHETERIZATION  Result Date: 10/13/2021   Ost LM to Mid LM lesion is 30% stenosed.   Prox LAD to Mid LAD lesion is 90% stenosed.   Mid LAD lesion is 90% stenosed.   LPAV lesion is 60% stenosed.   Prox Cx to Mid Cx lesion is 20% stenosed.   There is severe left ventricular systolic dysfunction.   LV end diastolic pressure is severely elevated.   The left ventricular ejection fraction is less than 25% by visual estimate. 1.  Codominant coronary arteries with severe proximal and mid LAD disease with 2 aneurysmal areas in the diseased segment.  Patent left circumflex stent with  mild in-stent restenosis. 2.  Severely reduced LV systolic function with an EF of 10%.  Severely elevated left ventricular end-diastolic pressure at 38 mmHg. 3.  Right heart catheterization was performed due to findings of low EF and elevated LVEDP.  It showed severely elevated right and left-sided filling pressures, severe pulmonary hypertension and mildly reduced cardiac output.  Pulmonary hypertension seems to be venous with transpulmonary gradient of only 11. Recommendations: The patient is pre cardiogenic shock with severe volume overload.  Recommend transfer to [facility] advanced heart failure for management.  I suspect that he will require inotropic support while diuresing him. Resume heparin at 5:30 PM without bolus. I decreased the dose of carvedilol by half. Management of  his LAD disease will be challenging.  I do not think he is a surgical candidate and thus his best option is likely with high risk PCI if doable after optimizing his heart failure.  The LAD is moderately to severely calcified but likely not able to use atherectomy given the presence of aneurysmal areas.   DG Chest Port 1 View  Result Date: 10/12/2021 CLINICAL DATA:  Shortness of breath.  History of COPD. EXAM: PORTABLE CHEST 1 VIEW COMPARISON:  None available. FINDINGS: Dual lead left-sided pacemaker in place. The heart is enlarged. Aortic atherosclerosis. There is diffuse peribronchial thickening and interstitial coarsening. Limited left basilar assessment due to soft tissue attenuation from habitus. No confluent airspace disease. No large pleural effusion. No pneumothorax. IMPRESSION: Cardiomegaly. Diffuse peribronchial thickening and interstitial coarsening, may represent pulmonary edema, acute bronchitis, or chronic lung disease. No prior exams available for comparison. Aortic Atherosclerosis (ICD10-I70.0). Electronically Signed   By: Keith Rake M.D.   On: 10/12/2021 17:39   ECHOCARDIOGRAM COMPLETE  Result Date: 10/13/2021    ECHOCARDIOGRAM REPORT   Patient Name:   DEAUNTE DENTE Date of Exam: 10/13/2021 Medical Rec #:  [id number]    Height:       70.0 in Accession #:    [id number]   Weight:       247.0 lb Date of Birth:  November 21, 1945   BSA:          2.283 m Patient Age:    76 years     BP:           105/64 mmHg Patient Gender: M            HR:           82 bpm. Exam Location:  [facility] Procedure: 2D Echo, Color Doppler and Cardiac Doppler Indications:     NSTEMI I21.4  History:         Patient has no prior history of Echocardiogram examinations.                  Previous Myocardial Infarction, AICD; Risk                  Factors:Hypertension.  Sonographer:     Sherrie Sport Referring Phys:  [id number] A ARIDA Diagnosing Phys: Kathlyn Sacramento MD IMPRESSIONS  1. Left ventricular ejection fraction, by  estimation, is 20 to 25%. The left ventricle has severely decreased function. The left ventricle demonstrates regional wall motion abnormalities (see scoring diagram/findings for description). The left ventricular internal cavity size was severely dilated. There is mild left ventricular hypertrophy. Left ventricular diastolic parameters are consistent with Grade II diastolic dysfunction (pseudonormalization). There is akinesis of the left ventricular, entire inferior wall and inferolateral wall. There is severe hypokinesis of the left  ventricular, mid-apical anterior wall and apical segment.  2. Right ventricular systolic function is normal. The right ventricular size is normal. Tricuspid regurgitation signal is inadequate for assessing PA pressure.  3. Left atrial size was moderately dilated.  4. Right atrial size was moderately dilated.  5. The mitral valve is normal in structure. Trivial mitral valve regurgitation. No evidence of mitral stenosis.  6. The aortic valve is normal in structure. Aortic valve regurgitation is mild. No aortic stenosis is present.  7. Aortic dilatation noted. There is mild dilatation of the aortic root, measuring 41 mm. FINDINGS  Left Ventricle: Left ventricular ejection fraction, by estimation, is 20 to 25%. The left ventricle has severely decreased function. The left ventricle demonstrates regional wall motion abnormalities. Severe hypokinesis of the left ventricular, mid-apical anterior wall and apical segment. The left ventricular internal cavity size was severely dilated. There is mild left ventricular hypertrophy. Left ventricular diastolic parameters are consistent with Grade II diastolic dysfunction (pseudonormalization). Right Ventricle: The right ventricular size is normal. No increase in right ventricular wall thickness. Right ventricular systolic function is normal. Tricuspid regurgitation signal is inadequate for assessing PA pressure. Left Atrium: Left atrial size was  moderately dilated. Right Atrium: Right atrial size was moderately dilated. Pericardium: There is no evidence of pericardial effusion. Mitral Valve: The mitral valve is normal in structure. Trivial mitral valve regurgitation. No evidence of mitral valve stenosis. MV peak gradient, 5.1 mmHg. The mean mitral valve gradient is 2.0 mmHg. Tricuspid Valve: The tricuspid valve is normal in structure. Tricuspid valve regurgitation is not demonstrated. No evidence of tricuspid stenosis. Aortic Valve: The aortic valve is normal in structure. Aortic valve regurgitation is mild. No aortic stenosis is present. Aortic valve mean gradient measures 2.5 mmHg. Aortic valve peak gradient measures 3.6 mmHg. Aortic valve area, by VTI measures 2.68 cm. Pulmonic Valve: The pulmonic valve was normal in structure. Pulmonic valve regurgitation is not visualized. No evidence of pulmonic stenosis. Aorta: Aortic dilatation noted. There is mild dilatation of the aortic root, measuring 41 mm. Venous: The inferior vena cava was not well visualized. IAS/Shunts: No atrial level shunt detected by color flow Doppler.  LEFT VENTRICLE PLAX 2D LVIDd:         7.00 cm      Diastology LVIDs:         6.30 cm      LV e' medial:    3.48 cm/s LV PW:         1.10 cm      LV E/e' medial:  19.6 LV IVS:        1.10 cm      LV e' lateral:   5.66 cm/s LVOT diam:     2.00 cm      LV E/e' lateral: 12.0 LV SV:         47 LV SV Index:   21 LVOT Area:     3.14 cm  LV Volumes (MOD) LV vol d, MOD A2C: 245.0 ml LV vol d, MOD A4C: 226.0 ml LV vol s, MOD A2C: 166.0 ml LV vol s, MOD A4C: 168.0 ml LV SV MOD A2C:     79.0 ml LV SV MOD A4C:     226.0 ml LV SV MOD BP:      71.3 ml RIGHT VENTRICLE RV Basal diam:  4.30 cm RV S prime:     11.20 cm/s TAPSE (M-mode): 2.7 cm LEFT ATRIUM  Index        RIGHT ATRIUM           Index LA diam:        5.20 cm  2.28 cm/m   RA Area:     27.10 cm LA Vol (A2C):   107.0 ml 46.86 ml/m  RA Volume:   92.90 ml  40.69 ml/m LA Vol (A4C):    94.4 ml  41.35 ml/m LA Biplane Vol: 102.0 ml 44.67 ml/m  AORTIC VALVE                    PULMONIC VALVE AV Area (Vmax):    2.54 cm     PV Vmax:        0.67 m/s AV Area (Vmean):   2.41 cm     PV Vmean:       43.200 cm/s AV Area (VTI):     2.68 cm     PV VTI:         0.102 m AV Vmax:           95.20 cm/s   PV Peak grad:   1.8 mmHg AV Vmean:          70.750 cm/s  PV Mean grad:   1.0 mmHg AV VTI:            0.177 m      RVOT Peak grad: 2 mmHg AV Peak Grad:      3.6 mmHg AV Mean Grad:      2.5 mmHg LVOT Vmax:         77.00 cm/s LVOT Vmean:        54.200 cm/s LVOT VTI:          0.151 m LVOT/AV VTI ratio: 0.85  AORTA Ao Root diam: 3.97 cm MITRAL VALVE               TRICUSPID VALVE MV Area (PHT): 5.58 cm    TR Peak grad:   9.7 mmHg MV Area VTI:   2.87 cm    TR Vmax:        156.00 cm/s MV Peak grad:  5.1 mmHg MV Mean grad:  2.0 mmHg    SHUNTS MV Vmax:       1.13 m/s    Systemic VTI:  0.15 m MV Vmean:      65.8 cm/s   Systemic Diam: 2.00 cm MV Decel Time: 136 msec    Pulmonic VTI:  0.106 m MV E velocity: 68.10 cm/s MV A velocity: 90.40 cm/s MV E/A ratio:  0.75 Kathlyn Sacramento MD Electronically signed by Kathlyn Sacramento MD Signature Date/Time: 10/13/2021/2:16:19 PM    Final    [place] EKG SITE RITE  Result Date: 10/13/2021 If Site Rite image not attached, placement could not be confirmed due to current cardiac rhythm.    Medications:     Scheduled Medications:  aspirin EC  81 mg Oral Daily   bisoprolol  2.5 mg Oral Daily   Chlorhexidine Gluconate Cloth  6 each Topical Daily   clopidogrel  75 mg Oral Daily   dapagliflozin propanediol  10 mg Oral Daily   fluticasone furoate-vilanterol  1 puff Inhalation Daily   furosemide  80 mg Intravenous BID   losartan  25 mg Oral Daily   methylPREDNISolone (SOLU-MEDROL) injection  40 mg Intravenous Q12H   potassium chloride  40 mEq Oral Daily   rosuvastatin  40 mg Oral Daily   senna  1 tablet Oral Daily  sodium chloride flush  10-40 mL Intracatheter Q12H   sodium  chloride flush  3 mL Intravenous Q12H   spironolactone  12.5 mg Oral Daily    Infusions:  sodium chloride     heparin 1,750 Units/hr (10/14/21 1000)    PRN Medications: sodium chloride, acetaminophen, fluticasone, ipratropium-albuterol, ondansetron (ZOFRAN) IV, sodium chloride flush, sodium chloride flush   Assessment/Plan:   1.  CAD: NSTEMI, peak troponin 5795.  Patient has history of CAD with prior PCIs.  Coronary angiography today showed complex proximal LAD disease with serial 90% stenoses separated by small aneurysmal areas.  He had a 60% mid LCx stenosis.  RCA with mild disease.  With wheezing and marked volume overload, it was decided to try to optimized his CHF and COPD prior to complex LAD intervention.  He is not thought to be a CABG candidate with severe LV dysfunction, low PAPI, and COPD. Denies CP today - Plan tentatively for complex PCI to LAD on Wednesday with Dr. Fletcher Anon.  - Continue DAPT  He will need to be on anticoagulation due to previous DVT/PE, so will use Plavix rather than Brilinta.  - Continue Crestor 40 mg daily.  - heparin gtt for now.  2. 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  Echo with EF 20-25%, severe LV dilation, RV mildly dysfunctional.  RHC today with markedly elevated filling pressures, CI 2.03, and PAPI < 1.  Creatinine stable 1.04. Diuresing well with IV lasix  - Continue IV lasix  - Continue bisoprolol 2.5 mg daily (beta-1 selective with COPD).  - Continue losartan 25 mg daily (eventually transition to [place]).  - Continue dapagliflozin 10 mg daily.  - Continue spironolactone 12.5 mg daily.  - Place PICC, follow CVP and co-ox.  3. 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  Will cover with heparin gtt for now.  - Consider low-dose apixaban (2.5 bid) per [facility] trail  4. COPD: Distant BS, wheezing.   - Needs to quit smoking.  - On steroids. Wheezing improved. Will taper - Nebs 5. Thrombocytopenia: Mild, chronic.     Length  of Stay: 1   Glori Bickers MD 10/14/2021, 11:46 AM  Advanced Heart Failure Team Pager [phone] (M-F; [place])  Please contact [facility] for night-coverage after hours (4p -7a ) and weekends on [email or web address]

## 2021-10-14 NOTE — Consult Note (Signed)
ANTICOAGULATION CONSULT NOTE  ?Pharmacy Consult for heparin ?Indication: NTSTEMi, h/o VTE ?Brief A/P: aPTT subtherapeutic.  Increase Heparin rate ? ?No Known Allergies ? ?Patient Measurements: ?Weight: 112.3 kg (247 lb 9.2 oz) ?Heparin Dosing Weight: 97.2 kg ? ?Vital Signs: ?Temp: 96.6 ?F (35.9 ?C) (03/11 0700) ?Temp Source: Axillary (03/11 0700) ?BP: 92/59 (03/11 1400) ?Pulse Rate: 90 (03/11 1400) ? ?Labs: ?Recent Labs  ?  10/12/21 ?1707 10/12/21 ?1707 10/12/21 ?1927 10/12/21 ?2106 10/13/21 ?2637 10/14/21 ?8588 10/14/21 ?1400  ?HGB 18.5*  --   --   --  17.2* 16.9  --   ?HCT 54.8*  --   --   --  52.8* 50.3  --   ?PLT 110*  --   --   --  100* 96*  --   ?APTT  --    < >  --  27 54* 49* 54*  ?LABPROT  --   --   --  14.4  --   --   --   ?INR  --   --   --  1.1  --   --   --   ?HEPARINUNFRC  --   --   --  1.09* 0.64 0.40  --   ?CREATININE 1.09  --   --   --  1.04 1.20  --   ?TROPONINIHS  --   --  5,778* 5,795*  --   --   --   ? < > = values in this interval not displayed.  ? ? ? ?Estimated Creatinine Clearance: 68.8 mL/min (by C-G formula based on SCr of 1.2 mg/dL). ? ? ? ?Assessment: ?76 yo male admitted with NSTEMI s/p cath, h/o VTE and Eliquis on hold, for heparin. Continuing on heparin while plans are made for potential high risk PCI.  ? ?aPTT subtherapeutic at 54 on 1750 units/hr. CBC within normal range.  ? ?Goal of Therapy:  ?aPTT 66-102 seconds ?Heparin level 0.3-0.7 units/ml once aPTT and heparin level correlate.  ?Monitor platelets by anticoagulation protocol: Yes ? ?Plan:  ?Bolus 3,000 units heparin ?Increase Heparin 2150 units/hr ?aPTT in 8 hours ?Daily aPTT and CBC while on heparin ? ?Cathrine Muster, PharmD ?PGY2 Cardiology Pharmacy Resident ?Phone: [phone] ?10/14/2021  3:15 PM ? ?Please check [email or web address] for unit-specific pharmacy phone numbers. ? ? ? ? ? ? ? ?

## 2021-10-14 NOTE — Consult Note (Signed)
ANTICOAGULATION CONSULT NOTE  ?Pharmacy Consult for heparin ?Indication: NTSTEMi, h/o VTE ?Brief A/P: aPTT subtherapeutic.  Increase Heparin rate ? ?No Known Allergies ? ?Patient Measurements: ?Weight: 118.4 kg (261 lb 0.4 oz) ?Heparin Dosing Weight: 97.2 kg ? ?Vital Signs: ?BP: 95/67 (03/11 0400) ?Pulse Rate: 72 (03/11 0400) ? ?Labs: ?Recent Labs  ?  10/12/21 ?1707 10/12/21 ?1927 10/12/21 ?2106 10/13/21 ?7001 10/14/21 ?7494  ?HGB 18.5*  --   --  17.2* 16.9  ?HCT 54.8*  --   --  52.8* 50.3  ?PLT 110*  --   --  100* 96*  ?APTT  --   --  27 54* 49*  ?LABPROT  --   --  14.4  --   --   ?INR  --   --  1.1  --   --   ?HEPARINUNFRC  --   --  1.09* 0.64 0.40  ?CREATININE 1.09  --   --  1.04 1.20  ?TROPONINIHS  --  4,967* 5,916*  --   --   ? ? ? ?Estimated Creatinine Clearance: 70.7 mL/min (by C-G formula based on SCr of 1.2 mg/dL). ? ? ? ?Assessment: ?76 yo male admitted with NSTEMI s/p cath, h/o VTE and Eliquis on hold, for heparin  ? ?Goal of Therapy:  ?aPTT 66-102 seconds ?Heparin level 0.3-0.7 units/ml once aPTT and heparin level correlate.  ?Monitor platelets by anticoagulation protocol: Yes ? ?Plan:  ?Increase Heparin 1750 units/hr ?aPTT in 8 hours ? ?Phillis Knack, PharmD, BCPS ? ? ? ? ? ? ?

## 2021-10-14 NOTE — Plan of Care (Signed)

## 2021-10-15 LAB — CBC
HCT: 51.7 % (ref 39.0–52.0)
Hemoglobin: 16.8 g/dL (ref 13.0–17.0)
MCH: 31.4 pg (ref 26.0–34.0)
MCHC: 32.5 g/dL (ref 30.0–36.0)
MCV: 96.6 fL (ref 80.0–100.0)
Platelets: 94 10*3/uL — ABNORMAL LOW (ref 150–400)
RBC: 5.35 MIL/uL (ref 4.22–5.81)
RDW: 12.2 % (ref 11.5–15.5)
WBC: 12.2 10*3/uL — ABNORMAL HIGH (ref 4.0–10.5)
nRBC: 0 % (ref 0.0–0.2)

## 2021-10-15 LAB — BASIC METABOLIC PANEL
Anion gap: 8 (ref 5–15)
BUN: 25 mg/dL — ABNORMAL HIGH (ref 8–23)
CO2: 33 mmol/L — ABNORMAL HIGH (ref 22–32)
Calcium: 9.1 mg/dL (ref 8.9–10.3)
Chloride: 97 mmol/L — ABNORMAL LOW (ref 98–111)
Creatinine, Ser: 1.19 mg/dL (ref 0.61–1.24)
GFR, Estimated: >60 mL/min (ref >60)
Glucose, Bld: 202 mg/dL — ABNORMAL HIGH (ref 70–99)
Potassium: 4.3 mmol/L (ref 3.5–5.1)
Sodium: 138 mmol/L (ref 135–145)

## 2021-10-15 LAB — GLUCOSE, CAPILLARY
Glucose-Capillary: 213 mg/dL — ABNORMAL HIGH (ref 70–99)
Glucose-Capillary: 101 mg/dL — ABNORMAL HIGH (ref 70–99)
Glucose-Capillary: 178 mg/dL — ABNORMAL HIGH (ref 70–99)

## 2021-10-15 LAB — COOXEMETRY PANEL
Carboxyhemoglobin: 1.5 % (ref 0.5–1.5)
Methemoglobin: <0.7 % (ref 0.0–1.5)
O2 Saturation: 65.9 %
Total hemoglobin: 18.9 g/dL — ABNORMAL HIGH (ref 12.0–16.0)

## 2021-10-15 LAB — APTT
aPTT: 101 seconds — ABNORMAL HIGH (ref 24–36)
aPTT: 105 seconds — ABNORMAL HIGH (ref 24–36)
aPTT: 96 seconds — ABNORMAL HIGH (ref 24–36)

## 2021-10-15 LAB — HEPARIN LEVEL (UNFRACTIONATED)
Heparin Unfractionated: 1 IU/mL — ABNORMAL HIGH (ref 0.30–0.70)
Heparin Unfractionated: >1.1 IU/mL — ABNORMAL HIGH (ref 0.30–0.70)

## 2021-10-15 MED ORDER — TORSEMIDE 20 MG PO TABS
20.0000 mg | ORAL_TABLET | Freq: Every day | ORAL | Status: DC
  Administered 2021-10-15 – 2021-10-16 (×2): 20 mg via ORAL
  Filled 2021-10-15 (×2): qty 1

## 2021-10-15 MED ORDER — INSULIN ASPART 100 UNIT/ML IJ SOLN
0.0000 [IU] | Freq: Three times a day (TID) | INTRAMUSCULAR | Status: DC
  Administered 2021-10-15: 12:00:00 3 [IU] via SUBCUTANEOUS
  Administered 2021-10-16: 2 [IU] via SUBCUTANEOUS
  Administered 2021-10-17: 5 [IU] via SUBCUTANEOUS
  Administered 2021-10-17: 1 [IU] via SUBCUTANEOUS
  Administered 2021-10-18 – 2021-10-19 (×2): 2 [IU] via SUBCUTANEOUS

## 2021-10-15 MED ORDER — LIVING WELL WITH DIABETES BOOK
Freq: Once | Status: DC
  Filled 2021-10-15: qty 1

## 2021-10-15 NOTE — Progress Notes (Addendum)
Advanced Heart Failure Rounding Note   Subjective:    Sitting on side of bed. Talking to RN.   Denies CP or SOB.   Diuresed well. Over 3L out. Weight down 9 pounds since admit. CVP 4-5.   Scr stable at 1.2  Remains on solumedrol for his COPD    Objective:   Weight Range:  Vital Signs:   Temp:  [97.9 F (36.6 C)-98.7 F (37.1 C)] 98.1 F (36.7 C) (03/12 0338) Pulse Rate:  [69-93] 93 (03/12 0800) Resp:  [16-30] 22 (03/12 0800) BP: (89-136)/(56-81) 114/71 (03/12 0800) SpO2:  [89 %-95 %] 93 % (03/12 0800) Weight:  [112.3 kg] 112.3 kg (03/11 1127) Last BM Date :  (PTA)  Weight change: Filed Weights   10/13/21 1618 10/14/21 0500 10/14/21 1127  Weight: 116.2 kg 118.4 kg 112.3 kg    Intake/Output:   Intake/Output Summary (Last 24 hours) at 10/15/2021 0949 Last data filed at 10/14/2021 2100 Gross per 24 hour  Intake 122.56 ml  Output 2650 ml  Net -2527.44 ml      Physical Exam: General:  Sitting in chair No resp difficulty HEENT: normal Neck: supple. no JVD. Carotids 2+ bilat; no bruits. No lymphadenopathy or thryomegaly appreciated. Cor: PMI nondisplaced. Regular rate & rhythm. No rubs, gallops or murmurs. Lungs: clear Abdomen: obese  soft, nontender, nondistended. No hepatosplenomegaly. No bruits or masses. Good bowel sounds. Extremities: no cyanosis, clubbing, rash, edema Neuro: alert & orientedx3, cranial nerves grossly intact. moves all 4 extremities w/o difficulty. Affect pleasant   Telemetry: Sinus 80-90 Personally reviewed  Labs: Basic Metabolic Panel: Recent Labs  Lab 10/12/21 1707 10/12/21 1927 10/13/21 0506 10/14/21 0504 10/15/21 0430  NA 139  --  140 137 138  K 3.5  --  3.6 4.1 4.3  CL 101  --  106 98 97*  CO2 30  --  29 30 33*  GLUCOSE 190*  --  197* 209* 202*  BUN 15  --  17 24* 25*  CREATININE 1.09  --  1.04 1.20 1.19  CALCIUM 9.3  --  8.7* 9.0 9.1  MG  --  1.9  --   --   --      Liver Function Tests: Recent Labs  Lab  10/12/21 1707  AST 59*  ALT 23  ALKPHOS 76  BILITOT 2.7*  PROT 7.2  ALBUMIN 3.6    No results for input(s): LIPASE, AMYLASE in the last 168 hours. No results for input(s): AMMONIA in the last 168 hours.  CBC: Recent Labs  Lab 10/12/21 1707 10/13/21 0506 10/14/21 0504 10/15/21 0430  WBC 9.1 9.2 14.2* 12.2*  NEUTROABS 7.0  --  12.2*  --   HGB 18.5* 17.2* 16.9 16.8  HCT 54.8* 52.8* 50.3 51.7  MCV 92.3 95.1 95.1 96.6  PLT 110* 100* 96* 94*     Cardiac Enzymes: No results for input(s): CKTOTAL, CKMB, CKMBINDEX, TROPONINI in the last 168 hours.  BNP: BNP (last 3 results) Recent Labs    10/12/21 1707  BNP 431.7*     ProBNP (last 3 results) No results for input(s): PROBNP in the last 8760 hours.    Other results:  Imaging: CARDIAC CATHETERIZATION  Result Date: 10/13/2021   Ost LM to Mid LM lesion is 30% stenosed.   Prox LAD to Mid LAD lesion is 90% stenosed.   Mid LAD lesion is 90% stenosed.   LPAV lesion is 60% stenosed.   Prox Cx to Mid Cx lesion is 20% stenosed.  There is severe left ventricular systolic dysfunction.   LV end diastolic pressure is severely elevated.   The left ventricular ejection fraction is less than 25% by visual estimate. 1.  Codominant coronary arteries with severe proximal and mid LAD disease with 2 aneurysmal areas in the diseased segment.  Patent left circumflex stent with mild in-stent restenosis. 2.  Severely reduced LV systolic function with an EF of 10%.  Severely elevated left ventricular end-diastolic pressure at 38 mmHg. 3.  Right heart catheterization was performed due to findings of low EF and elevated LVEDP.  It showed severely elevated right and left-sided filling pressures, severe pulmonary hypertension and mildly reduced cardiac output.  Pulmonary hypertension seems to be venous with transpulmonary gradient of only 11. Recommendations: The patient is pre cardiogenic shock with severe volume overload.  Recommend transfer to [facility] advanced heart failure for management.  I suspect that he will require inotropic support while diuresing him. Resume heparin at 5:30 PM without bolus. I decreased the dose of carvedilol by half. Management of his LAD disease will be challenging.  I do not think he is a surgical candidate and thus his best option is likely with high risk PCI if doable after optimizing his heart failure.  The LAD is moderately to severely calcified but likely not able to use atherectomy given the presence of aneurysmal areas.   ECHOCARDIOGRAM COMPLETE  Result Date: 10/13/2021    ECHOCARDIOGRAM REPORT   Patient Name:   Paul Bullock Date of Exam: 10/13/2021 Medical Rec #:  [id number]    Height:       70.0 in Accession #:    [id number]   Weight:       247.0 lb Date of Birth:  1945/11/26   BSA:          2.283 m Patient Age:    76 years     BP:           105/64 mmHg Patient Gender: M            HR:           82 bpm. Exam Location:  [facility] Procedure: 2D Echo, Color Doppler and Cardiac Doppler Indications:     NSTEMI I21.4  History:         Patient has no prior history of Echocardiogram examinations.                  Previous Myocardial Infarction, AICD; Risk                  Factors:Hypertension.  Sonographer:     Sherrie Sport Referring Phys:  [id number] DGLOVFIE A ARIDA Diagnosing Phys: Kathlyn Sacramento MD IMPRESSIONS  1. Left ventricular ejection fraction, by estimation, is 20 to 25%. The left ventricle has severely decreased function. The left ventricle demonstrates regional wall motion abnormalities (see scoring diagram/findings for description). The left ventricular internal cavity size was severely dilated. There is mild left ventricular hypertrophy. Left ventricular diastolic parameters are consistent with Grade II diastolic dysfunction (pseudonormalization). There is akinesis of the left ventricular, entire inferior wall and inferolateral wall. There is severe hypokinesis of the left ventricular, mid-apical anterior wall and  apical segment.  2. Right ventricular systolic function is normal. The right ventricular size is normal. Tricuspid regurgitation signal is inadequate for assessing PA pressure.  3. Left atrial size was moderately dilated.  4. Right atrial size was moderately dilated.  5. The mitral valve is normal in structure. Trivial mitral  valve regurgitation. No evidence of mitral stenosis.  6. The aortic valve is normal in structure. Aortic valve regurgitation is mild. No aortic stenosis is present.  7. Aortic dilatation noted. There is mild dilatation of the aortic root, measuring 41 mm. FINDINGS  Left Ventricle: Left ventricular ejection fraction, by estimation, is 20 to 25%. The left ventricle has severely decreased function. The left ventricle demonstrates regional wall motion abnormalities. Severe hypokinesis of the left ventricular, mid-apical anterior wall and apical segment. The left ventricular internal cavity size was severely dilated. There is mild left ventricular hypertrophy. Left ventricular diastolic parameters are consistent with Grade II diastolic dysfunction (pseudonormalization). Right Ventricle: The right ventricular size is normal. No increase in right ventricular wall thickness. Right ventricular systolic function is normal. Tricuspid regurgitation signal is inadequate for assessing PA pressure. Left Atrium: Left atrial size was moderately dilated. Right Atrium: Right atrial size was moderately dilated. Pericardium: There is no evidence of pericardial effusion. Mitral Valve: The mitral valve is normal in structure. Trivial mitral valve regurgitation. No evidence of mitral valve stenosis. MV peak gradient, 5.1 mmHg. The mean mitral valve gradient is 2.0 mmHg. Tricuspid Valve: The tricuspid valve is normal in structure. Tricuspid valve regurgitation is not demonstrated. No evidence of tricuspid stenosis. Aortic Valve: The aortic valve is normal in structure. Aortic valve regurgitation is mild. No aortic  stenosis is present. Aortic valve mean gradient measures 2.5 mmHg. Aortic valve peak gradient measures 3.6 mmHg. Aortic valve area, by VTI measures 2.68 cm. Pulmonic Valve: The pulmonic valve was normal in structure. Pulmonic valve regurgitation is not visualized. No evidence of pulmonic stenosis. Aorta: Aortic dilatation noted. There is mild dilatation of the aortic root, measuring 41 mm. Venous: The inferior vena cava was not well visualized. IAS/Shunts: No atrial level shunt detected by color flow Doppler.  LEFT VENTRICLE PLAX 2D LVIDd:         7.00 cm      Diastology LVIDs:         6.30 cm      LV e' medial:    3.48 cm/s LV PW:         1.10 cm      LV E/e' medial:  19.6 LV IVS:        1.10 cm      LV e' lateral:   5.66 cm/s LVOT diam:     2.00 cm      LV E/e' lateral: 12.0 LV SV:         47 LV SV Index:   21 LVOT Area:     3.14 cm  LV Volumes (MOD) LV vol d, MOD A2C: 245.0 ml LV vol d, MOD A4C: 226.0 ml LV vol s, MOD A2C: 166.0 ml LV vol s, MOD A4C: 168.0 ml LV SV MOD A2C:     79.0 ml LV SV MOD A4C:     226.0 ml LV SV MOD BP:      71.3 ml RIGHT VENTRICLE RV Basal diam:  4.30 cm RV S prime:     11.20 cm/s TAPSE (M-mode): 2.7 cm LEFT ATRIUM              Index        RIGHT ATRIUM           Index LA diam:        5.20 cm  2.28 cm/m   RA Area:     27.10 cm LA Vol (A2C):   107.0 ml 46.86 ml/m  RA  Volume:   92.90 ml  40.69 ml/m LA Vol (A4C):   94.4 ml  41.35 ml/m LA Biplane Vol: 102.0 ml 44.67 ml/m  AORTIC VALVE                    PULMONIC VALVE AV Area (Vmax):    2.54 cm     PV Vmax:        0.67 m/s AV Area (Vmean):   2.41 cm     PV Vmean:       43.200 cm/s AV Area (VTI):     2.68 cm     PV VTI:         0.102 m AV Vmax:           95.20 cm/s   PV Peak grad:   1.8 mmHg AV Vmean:          70.750 cm/s  PV Mean grad:   1.0 mmHg AV VTI:            0.177 m      RVOT Peak grad: 2 mmHg AV Peak Grad:      3.6 mmHg AV Mean Grad:      2.5 mmHg LVOT Vmax:         77.00 cm/s LVOT Vmean:        54.200 cm/s LVOT VTI:           0.151 m LVOT/AV VTI ratio: 0.85  AORTA Ao Root diam: 3.97 cm MITRAL VALVE               TRICUSPID VALVE MV Area (PHT): 5.58 cm    TR Peak grad:   9.7 mmHg MV Area VTI:   2.87 cm    TR Vmax:        156.00 cm/s MV Peak grad:  5.1 mmHg MV Mean grad:  2.0 mmHg    SHUNTS MV Vmax:       1.13 m/s    Systemic VTI:  0.15 m MV Vmean:      65.8 cm/s   Systemic Diam: 2.00 cm MV Decel Time: 136 msec    Pulmonic VTI:  0.106 m MV E velocity: 68.10 cm/s MV A velocity: 90.40 cm/s MV E/A ratio:  0.75 Kathlyn Sacramento MD Electronically signed by Kathlyn Sacramento MD Signature Date/Time: 10/13/2021/2:16:19 PM    Final    [place] EKG SITE RITE  Result Date: 10/13/2021 If Site Rite image not attached, placement could not be confirmed due to current cardiac rhythm.    Medications:     Scheduled Medications:  aspirin EC  81 mg Oral Daily   bisoprolol  2.5 mg Oral Daily   Chlorhexidine Gluconate Cloth  6 each Topical Daily   clopidogrel  75 mg Oral Daily   dapagliflozin propanediol  10 mg Oral Daily   fluticasone furoate-vilanterol  1 puff Inhalation Daily   furosemide  80 mg Intravenous BID   insulin aspart  0-9 Units Subcutaneous TID WC   losartan  25 mg Oral Daily   methylPREDNISolone (SOLU-MEDROL) injection  40 mg Intravenous Q12H   potassium chloride  40 mEq Oral Daily   rosuvastatin  40 mg Oral Daily   senna  1 tablet Oral Daily   sodium chloride flush  10-40 mL Intracatheter Q12H   sodium chloride flush  3 mL Intravenous Q12H   spironolactone  12.5 mg Oral Daily    Infusions:  sodium chloride     heparin 2,100 Units/hr (10/15/21 0759)  PRN Medications: sodium chloride, acetaminophen, fluticasone, ipratropium-albuterol, ondansetron (ZOFRAN) IV, sodium chloride flush, sodium chloride flush   Assessment/Plan:   1.  CAD: NSTEMI, peak troponin 5795.  Patient has history of CAD with prior PCIs.  Coronary angiography today showed complex proximal LAD disease with serial 90% stenoses separated by small  aneurysmal areas.  He had a 60% mid LCx stenosis.  RCA with mild disease.  With wheezing and marked volume overload, it was decided to try to optimized his CHF and COPD prior to complex LAD intervention.  He is not thought to be a CABG candidate with severe LV dysfunction, low PAPI, and COPD. No s/s angina currently - Plan tentatively for complex PCI to LAD on Wednesday with Dr. Fletcher Anon.  - Continue DAPT  He will need to be on anticoagulation due to previous DVT/PE, so will use Plavix rather than Brilinta.  - Continue Crestor 40 mg daily.  - heparin gtt for now.  2. 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  Echo with EF 20-25%, severe LV dilation, RV mildly dysfunctional.  RHC today with markedly elevated filling pressures, CI 2.03, and PAPI < 1.  Creatinine stable 1.04. Diuresing well with IV lasix  CVP 4-5 - Can stop IV lasix. Switch to po  - Continue bisoprolol 2.5 mg daily (beta-1 selective with COPD).  - Continue losartan 25 mg daily (eventually transition to [place]).  - Continue dapagliflozin 10 mg daily.  - Continue spironolactone 12.5 mg daily.  - Has PICC. Will check co-ox.  3. 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  Will cover with heparin gtt for now.  - Consider low-dose apixaban (2.5 bid) per [facility]-EXT trail  4. COPD: Distant BS, wheezing.   - Needs to quit smoking.  - On steroids. Wheezing improved. D/w CCM. Ok to stop without taper. - Nebs 5. Thrombocytopenia: Mild, chronic.  6. DM2  - new diagnosis. Hgba1c 7.0 - continue SGLT2i - cover with SSI - Consumt DM coordinator - Likely would benefit from [facility]   Transfer to the floor  Length of Stay: 2   Glori Bickers MD 10/15/2021, 9:49 AM  Advanced Heart Failure Team Pager [phone] (M-F; [place])  Please contact [facility] for night-coverage after hours (4p -7a ) and weekends on [email or web address]

## 2021-10-15 NOTE — Plan of Care (Signed)

## 2021-10-15 NOTE — Progress Notes (Signed)
ANTICOAGULATION CONSULT NOTE ? ?Pharmacy Consult for heparin ?Indication:  NSTEMI, hx VTE ? ?No Known Allergies ? ?Patient Measurements: ?Weight: 112.9 kg (248 lb 14.4 oz) ?Heparin Dosing Weight: 97kg ? ?Vital Signs: ?Temp: 98.4 ?F (36.9 ?C) (03/12 1546) ?Temp Source: Oral (03/12 1546) ?BP: 110/72 (03/12 1700) ?Pulse Rate: 81 (03/12 1800) ? ?Labs: ?Recent Labs  ?  10/12/21 ?1927 10/12/21 ?2106 10/12/21 ?2106 10/13/21 ?1062 10/14/21 ?6948 10/14/21 ?1400 10/14/21 ?2340 10/15/21 ?0430 10/15/21 ?1621  ?HGB  --   --    < > 17.2* 16.9  --   --  16.8  --   ?HCT  --   --   --  52.8* 50.3  --   --  51.7  --   ?PLT  --   --   --  100* 96*  --   --  94*  --   ?APTT  --  27   < > 54* 49*   < > 96* 101* 105*  ?LABPROT  --  14.4  --   --   --   --   --   --   --   ?INR  --  1.1  --   --   --   --   --   --   --   ?HEPARINUNFRC  --  1.09*   < > 0.64 0.40  --   --  1.00* >1.10*  ?CREATININE  --   --   --  1.04 1.20  --   --  1.19  --   ?TROPONINIHS 5,778* 5,795*  --   --   --   --   --   --   --   ? < > = values in this interval not displayed.  ? ? ?Estimated Creatinine Clearance: 69.6 mL/min (by C-G formula based on SCr of 1.19 mg/dL). ? ? ?Medical History: ?Past Medical History:  ?Diagnosis Date  ? AICD (automatic cardioverter/defibrillator) present   ? a. 11/2014 s/p MDT DDMB 1D4 [facility] MRI XT DR AICD (ser# [id number]).  ? Bladder cancer ([place])   ? a. 2013 s/p chemo/xrt.  ? CAD (coronary artery disease)   ? a. 11/2020 s/p MI/cardiac arrest-->DES to LCX x 1; b. 10/2011 Cath: LAD 87mw/ aneurysmal post-stenotic dilation-->med rx. LCX 50d to prior stent; c. 10/2021 NSTEMI/Cath: LM 30ost/m, LAD 90p/m, 99mLCX 20p/m, patent stent, LPAV 60, RCA mild diff dzs, EF<25%, LVEDP 40100m.  ? Chronic [place]FrEF (heart [place] with reduced [place] fraction[place] ? a. EF prev as low as 20% w/ subsequent improvement to 50-55%; b. 11/2020 limited echo: "windows were challenging...LVEF largely preserved."; c. 10/2021 LV gram: EF <25%, LVEDP 47m16m  ? COPD  (chronic obstructive pulmonary disease) (HCC)[place]? Depression   ? Diverticulitis   ? DVT (deep venous thrombosis) (HCC)[place]? Essential hypertension   ? Hyperlipidemia LDL goal <70   ? Ischemic cardiomyopathy   ? a. EF prev as low as 20%; b. 11/2014 s/p MDT [id number] MRI XT DR AICD; c. subsequent improvement in EF to 50-55%; d. 11/2020 limited echo: "windows were challenging...LVEF largely preserved."; e. 10/2021 LV Gram: EF <25%.  ? MI (myocardial infarction) (HCC)[place]? Migraines   ? Morbid obesity (HCC)[place]? 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  ? Thrombocytopenia (HCC)[place]? Tobacco abuse   ? Varicose veins of left lower extremity   ? ? ? ?Assessment: ?75 y71  admitted with NSTEMI s/p cath. Pt has hx VTE on apixaban PTA, holding for heparin while determining PCI plans. ? ?Heparin level >1.1, aPTT slightly above goal at 105 seconds. ? ? ?Goal of Therapy:  ?Heparin level 0.3-0.7 units/ml ?aPTT 66-102 seconds ?Monitor platelets by anticoagulation protocol: Yes ?  ?Plan:  ?Reduce heparin to 2000 units/h ?Recheck heparin level and aPTT with am labs ? ?Arrie Senate, PharmD, BCPS, BCCP ?Clinical Pharmacist ?[phone] ?Please check AMION for all [place] numbers ?10/15/2021 ? ? ? ?

## 2021-10-15 NOTE — Consult Note (Signed)
ANTICOAGULATION CONSULT NOTE  ?Pharmacy Consult for heparin ?Indication: NTSTEMI, h/o VTE ?Brief A/P: aPTT within goal range  Continue Heparin at current rate  ? ?No Known Allergies ? ?Patient Measurements: ?Weight: 112.3 kg (247 lb 9.2 oz) ?Heparin Dosing Weight: 97.2 kg ? ?Vital Signs: ?Temp: 98.7 ?F (37.1 ?C) (03/11 2345) ?Temp Source: Oral (03/11 2345) ?BP: 98/77 (03/11 2100) ?Pulse Rate: 73 (03/11 2100) ? ?Labs: ?Recent Labs  ?  10/12/21 ?1707 10/12/21 ?1707 10/12/21 ?1927 10/12/21 ?2106 10/13/21 ?9518 10/14/21 ?8416 10/14/21 ?1400 10/14/21 ?2340  ?HGB 18.5*  --   --   --  17.2* 16.9  --   --   ?HCT 54.8*  --   --   --  52.8* 50.3  --   --   ?PLT 110*  --   --   --  100* 96*  --   --   ?APTT  --    < >  --  27 54* 49* 54* 96*  ?LABPROT  --   --   --  14.4  --   --   --   --   ?INR  --   --   --  1.1  --   --   --   --   ?HEPARINUNFRC  --   --   --  1.09* 0.64 0.40  --   --   ?CREATININE 1.09  --   --   --  1.04 1.20  --   --   ?TROPONINIHS  --   --  5,778* 5,795*  --   --   --   --   ? < > = values in this interval not displayed.  ? ? ? ?Estimated Creatinine Clearance: 68.8 mL/min (by C-G formula based on SCr of 1.2 mg/dL). ? ? ? ?Assessment: ?76 yo male admitted with NSTEMI s/p cath, h/o VTE and Eliquis on hold, for heparin  ? ?Goal of Therapy:  ?aPTT 66-102 seconds ?Heparin level 0.3-0.7 units/ml once aPTT and heparin level correlate.  ?Monitor platelets by anticoagulation protocol: Yes ? ?Plan:  ?Continue Heparin at current rate  ? ?Phillis Knack, PharmD, BCPS ? ? ? ? ? ? ?

## 2021-10-15 NOTE — Consult Note (Signed)
ANTICOAGULATION CONSULT NOTE  ?Pharmacy Consult for heparin ?Indication: NTSTEMI, h/o VTE ? ?No Known Allergies ? ?Patient Measurements: ?Weight: 112.3 kg (247 lb 9.2 oz) ?Heparin Dosing Weight: 97.2 kg ? ?Vital Signs: ?Temp: 98.1 ?F (36.7 ?C) (03/12 3888) ?Temp Source: Oral (03/12 2800) ?BP: 108/65 (03/12 0600) ?Pulse Rate: 72 (03/12 0600) ? ?Labs: ?Recent Labs  ?  10/12/21 ?1707 10/12/21 ?1927 10/12/21 ?2106 10/13/21 ?3491 10/14/21 ?7915 10/14/21 ?1400 10/14/21 ?2340 10/15/21 ?0430  ?HGB  --   --   --  17.2* 16.9  --   --  16.8  ?HCT  --   --   --  52.8* 50.3  --   --  51.7  ?PLT  --   --   --  100* 96*  --   --  94*  ?APTT   < >  --  27 54* 49* 54* 96* 101*  ?LABPROT  --   --  14.4  --   --   --   --   --   ?INR  --   --  1.1  --   --   --   --   --   ?HEPARINUNFRC   < >  --  1.09* 0.64 0.40  --   --  1.00*  ?CREATININE  --   --   --  1.04 1.20  --   --  1.19  ?TROPONINIHS  --  0,569* 7,948*  --   --   --   --   --   ? < > = values in this interval not displayed.  ? ? ? ?Estimated Creatinine Clearance: 69.4 mL/min (by C-G formula based on SCr of 1.19 mg/dL). ? ? ? ?Assessment: ?76 yo male admitted with NSTEMI s/p cath, h/o VTE and Eliquis on hold, for heparin. Continuing on heparin while plans are made for potential high risk PCI.  ? ?aPTT therapeutic at 101 on 2150 units/hr. Heparin level still elevated and not correlating with aPTT. Platelets low at 94, but stable. Hgb stable at 16.8.  ? ?Goal of Therapy:  ?aPTT 66-102 seconds ?Heparin level 0.3-0.7 units/ml once aPTT and heparin level correlate.  ?Monitor platelets by anticoagulation protocol: Yes ? ?Plan:  ?Decrease heparin to 2100 units/hr ?aPTT and HL in 8 hours ?Daily aPTT and CBC while on heparin ? ?Cathrine Muster, PharmD ?PGY2 Cardiology Pharmacy Resident ?Phone: [phone] ?10/15/2021  7:40 AM ? ?Please check [email or web address] for unit-specific pharmacy phone numbers. ? ? ? ? ? ? ? ?

## 2021-10-15 NOTE — Progress Notes (Signed)
Inpatient Diabetes Program Recommendations ? ?AACE/ADA: New Consensus Statement on Inpatient Glycemic Control  ? ?Target Ranges:  Prepandial:   less than 140 mg/dL ?     Peak postprandial:   less than 180 mg/dL (1-2 hours) ?     Critically ill patients:  140 - 180 mg/dL  ? ? Latest Reference Range & Units 10/13/21 05:06 10/14/21 05:04 10/15/21 04:30  ?Glucose 70 - 99 mg/dL 197 (H) 209 (H) 202 (H)  ?Hemoglobin A1C 4.8 - 5.6 % 7.0 (H)    ? ?Review of Glycemic Control ? ?Diabetes history: No ?Outpatient Diabetes medications: NA ?Current orders for Inpatient glycemic control: Novolog 0-9 units TID with meals, Farxiga 10 mg daily ? ?NOTE: Noted consult for Diabetes Coordinator for new DM dx. Diabetes Coordinator is not on campus over the weekend but available by pager from 8am to 5pm for questions or concerns. Chart reviewed. Ordered Living Well with DM book, RD consult for diet education, patient education by bedside nursing. Inpatient diabetes coordinator will follow up with patient on Monday. ? ?Thanks, ?Barnie Alderman, RN, MSN, CDE ?Diabetes Coordinator ?Inpatient Diabetes Program ?[phone] (Team Pager from 8am to 5pm) ? ? ? ? ? ? ?

## 2021-10-16 ENCOUNTER — Other Ambulatory Visit (HOSPITAL_COMMUNITY): Payer: Self-pay

## 2021-10-16 ENCOUNTER — Encounter: Payer: Self-pay | Admitting: Cardiovascular Disease

## 2021-10-16 LAB — APTT: aPTT: 92 seconds — ABNORMAL HIGH (ref 24–36)

## 2021-10-16 LAB — GLUCOSE, CAPILLARY
Glucose-Capillary: 120 mg/dL — ABNORMAL HIGH (ref 70–99)
Glucose-Capillary: 160 mg/dL — ABNORMAL HIGH (ref 70–99)
Glucose-Capillary: 112 mg/dL — ABNORMAL HIGH (ref 70–99)
Glucose-Capillary: 125 mg/dL — ABNORMAL HIGH (ref 70–99)

## 2021-10-16 LAB — BASIC METABOLIC PANEL
Anion gap: 9 (ref 5–15)
BUN: 27 mg/dL — ABNORMAL HIGH (ref 8–23)
CO2: 37 mmol/L — ABNORMAL HIGH (ref 22–32)
Calcium: 9.1 mg/dL (ref 8.9–10.3)
Chloride: 95 mmol/L — ABNORMAL LOW (ref 98–111)
Creatinine, Ser: 1.24 mg/dL (ref 0.61–1.24)
GFR, Estimated: >60 mL/min (ref >60)
Glucose, Bld: 106 mg/dL — ABNORMAL HIGH (ref 70–99)
Potassium: 3.9 mmol/L (ref 3.5–5.1)
Sodium: 141 mmol/L (ref 135–145)

## 2021-10-16 LAB — CBC
HCT: 53.9 % — ABNORMAL HIGH (ref 39.0–52.0)
Hemoglobin: 18.2 g/dL — ABNORMAL HIGH (ref 13.0–17.0)
MCH: 32.3 pg (ref 26.0–34.0)
MCHC: 33.8 g/dL (ref 30.0–36.0)
MCV: 95.7 fL (ref 80.0–100.0)
Platelets: 99 10*3/uL — ABNORMAL LOW (ref 150–400)
RBC: 5.63 MIL/uL (ref 4.22–5.81)
RDW: 12.1 % (ref 11.5–15.5)
WBC: 10.5 10*3/uL (ref 4.0–10.5)
nRBC: 0 % (ref 0.0–0.2)

## 2021-10-16 LAB — HEPARIN LEVEL (UNFRACTIONATED): Heparin Unfractionated: 1.03 IU/mL — ABNORMAL HIGH (ref 0.30–0.70)

## 2021-10-16 MED ORDER — SACUBITRIL-VALSARTAN 24-26 MG PO TABS
1.0000 | ORAL_TABLET | Freq: Two times a day (BID) | ORAL | Status: DC
  Administered 2021-10-16 – 2021-10-19 (×7): 1 via ORAL
  Filled 2021-10-16 (×7): qty 1

## 2021-10-16 NOTE — Progress Notes (Signed)
Inpatient Diabetes Program Recommendations ? ?AACE/ADA: New Consensus Statement on Inpatient Glycemic Control (2015) ? ?Target Ranges:  Prepandial:   less than 140 mg/dL ?     Peak postprandial:   less than 180 mg/dL (1-2 hours) ?     Critically ill patients:  140 - 180 mg/dL  ? ?Lab Results  ?Component Value Date  ? GLUCAP 160 (H) 10/16/2021  ? HGBA1C 7.0 (H) 10/13/2021  ? ?Review of Glycemic Control ? Latest Reference Range & Units 10/15/21 11:32 10/15/21 15:44 10/15/21 21:05 10/16/21 06:11 10/16/21 11:42  ?Glucose-Capillary 70 - 99 mg/dL 213 (H) 101 (H) 178 (H) 120 (H) 160 (H)  ?(H): Data is abnormally high ? ?Diabetes history: No ?Outpatient Diabetes medications: NA ?Current orders for Inpatient glycemic control: Novolog 0-9 units TID with meals, Farxiga 10 mg daily ? ?Inpatient Diabetes Program Recommendations:   ?Spoke with pt about new diagnosis. Discussed A1C results and explained what an A1C is, basic pathophysiology of DM Type 2, basic home care, basic diabetes diet nutrition principles, importance of checking CBGs and maintaining good CBG control to prevent long-term and short-term complications. Reviewed signs and symptoms of hyperglycemia and hypoglycemia and how to treat hypoglycemia at home. Also reviewed blood sugar goals at home.  ? ?Discussed basic nutrition with decreasing carbohydrates and sugary drinks. Patient states he has been eating larger amounts of bread and sweets and drinking regular coke/dr. Pepper. ?Patient states willingness to change drinks to sugar free and focus on water-patient is also on fluid restriction. ?Patient has started reading Living Well With diabetes. ? ?Thank you, ?Nani Gasser Caterra Ostroff, RN, MSN, CDE  ?Diabetes Coordinator ?Inpatient Glycemic Control Team ?Team Pager [phone] (8am-5pm) ?10/16/2021 1:19 PM ? ? ? ? ? ? ?

## 2021-10-16 NOTE — Plan of Care (Signed)
Nutrition Education Note ? ?RD consulted for nutrition education regarding newly diagnosed diabetes. ? ?Spoke with pt at bedside. Pt reports having spoken with Diabetes Coordinator earlier today. 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 ? ?Pt reports that he typically drinks diet sodas but recent had been adding in more regular Coke and Diet Pepper. He reports that he is going to go back to all diet sodas. Pt enjoys using Mio liquid water flavoring in his water. ? ?Pt reports that he really enjoys sweets and desserts but does not eat these frequently. He reports that sometimes he will have a small portion of dessert for a few days in a row then not have any for a few weeks. ? ?Lab Results  ?Component Value Date  ? HGBA1C 7.0 (H) 10/13/2021  ? ? ?RD provided "Plate Method for Diabetes" handout from the Academy of Nutrition and Dietetics. Discussed different food groups and their effects on blood sugar, emphasizing carbohydrate-containing foods. Provided list of carbohydrates and recommended serving sizes of common foods. ? ?Discussed importance of controlled and consistent carbohydrate intake throughout the day. Provided examples of ways to balance meals/snacks and encouraged intake of high-fiber, whole grain complex carbohydrates. Teach back method used. ? ?Expect good compliance. ? ?Current diet order is Heart Healthy, patient is consuming approximately 75% of meals at this time. Labs and medications reviewed. No further nutrition interventions warranted at this time. RD contact information provided. If additional nutrition issues arise, please re-consult RD. ? ? ?Paul Bryant, MS, RD, LDN ?Inpatient Clinical Dietitian ?Please see AMiON for contact information. ? ?

## 2021-10-16 NOTE — Progress Notes (Signed)
Heart Failure Navigator Progress Note ? ?Assessed for Heart & Vascular TOC clinic readiness.  ?Patient does not meet criteria due to AHF rounding team consulted this hospitalization.  ? ?Navigator available for Educational resources.  ? ?Earnestine Leys, BSN, RN ?Heart Failure Nurse Navigator ?[phone]  ? ? ?

## 2021-10-16 NOTE — Plan of Care (Signed)
?  Problem: Health Behavior/Discharge Planning: Goal: Ability to manage health-related needs will improve Outcome: Progressing   Problem: Clinical Measurements: Goal: Will remain free from infection Outcome: Progressing Goal: Diagnostic test results will improve Outcome: Progressing Goal: Respiratory complications will improve Outcome: Progressing Goal: Cardiovascular complication will be avoided Outcome: Progressing   

## 2021-10-16 NOTE — Progress Notes (Signed)
Patient ID: Paul Bullock, male   DOB: 1946-01-30, 76 y.o.   MRN: [id number] ? ? ? ?Advanced Heart Failure Rounding Note ? ? ?Subjective:   ? ?No complaints this morning, no longer wheezing.  ? ?CVP 5, co-ox 66%.  ? ? ?Objective:   ?Weight Range: ? ?Vital Signs:   ?Temp:  [97.7 ?F (36.5 ?C)-98.4 ?F (36.9 ?C)] 97.7 ?F (36.5 ?C) (03/13 0352) ?Pulse Rate:  [71-89] 89 (03/13 0740) ?Resp:  [15-26] 17 (03/13 0740) ?BP: (93-140)/(57-82) 140/75 (03/13 0740) ?SpO2:  [89 %-96 %] 94 % (03/13 0848) ?Weight:  [112.2 kg-112.9 kg] 112.4 kg (03/13 0501) ?Last BM Date : 10/15/21 ? ?Weight change: ?Filed Weights  ? 10/15/21 1000 10/15/21 2027 10/16/21 0501  ?Weight: 112.9 kg 112.2 kg 112.4 kg  ? ? ?Intake/Output:  ? ?Intake/Output Summary (Last 24 hours) at 10/16/2021 0930 ?Last data filed at 10/16/2021 0505 ?Gross per 24 hour  ?Intake 689.8 ml  ?Output 2100 ml  ?Net -1410.2 ml  ?  ? ?Physical Exam: ?General: NAD ?Neck: No JVD, no thyromegaly or thyroid nodule.  ?Lungs: Distant BS. ?CV: Nondisplaced PMI.  Heart regular S1/S2, no S3/S4, no murmur.  No peripheral edema.   ?Abdomen: Soft, nontender, no hepatosplenomegaly, no distention.  ?Skin: Intact without lesions or rashes.  ?Neurologic: Alert and oriented x 3.  ?Psych: Normal affect. ?Extremities: No clubbing or cyanosis.  ?HEENT: Normal.  ? ? ?Telemetry: Sinus 80-90 Personally reviewed ? ?Labs: ?Basic Metabolic Panel: ?Recent Labs  ?Lab 10/12/21 ?1707 10/12/21 ?1927 10/13/21 ?0737 10/14/21 ?1062 10/15/21 ?0430 10/16/21 ?0500  ?NA 139  --  140 137 138 141  ?K 3.5  --  3.6 4.1 4.3 3.9  ?CL 101  --  106 98 97* 95*  ?CO2 30  --  29 30 33* 37*  ?GLUCOSE 190*  --  197* 209* 202* 106*  ?BUN 15  --  17 24* 25* 27*  ?CREATININE 1.09  --  1.04 1.20 1.19 1.24  ?CALCIUM 9.3  --  8.7* 9.0 9.1 9.1  ?MG  --  1.9  --   --   --   --   ? ? ?Liver Function Tests: ?Recent Labs  ?Lab 10/12/21 ?1707  ?AST 59*  ?ALT 23  ?ALKPHOS 76  ?BILITOT 2.7*  ?PROT 7.2  ?ALBUMIN 3.6  ? ?No results for input(s): LIPASE,  AMYLASE in the last 168 hours. ?No results for input(s): AMMONIA in the last 168 hours. ? ?CBC: ?Recent Labs  ?Lab 10/12/21 ?1707 10/13/21 ?6948 10/14/21 ?5462 10/15/21 ?0430 10/16/21 ?0500  ?WBC 9.1 9.2 14.2* 12.2* 10.5  ?NEUTROABS 7.0  --  12.2*  --   --   ?HGB 18.5* 17.2* 16.9 16.8 18.2*  ?HCT 54.8* 52.8* 50.3 51.7 53.9*  ?MCV 92.3 95.1 95.1 96.6 95.7  ?PLT 110* 100* 96* 94* 99*  ? ? ?Cardiac Enzymes: ?No results for input(s): CKTOTAL, CKMB, CKMBINDEX, TROPONINI in the last 168 hours. ? ?BNP: ?BNP (last 3 results) ?Recent Labs  ?  10/12/21 ?1707  ?BNP 431.7*  ? ? ?ProBNP (last 3 results) ?No results for input(s): PROBNP in the last 8760 hours. ? ? ? ?Other results: ? ?Imaging: ?No results found. ? ? ?Medications:   ? ? ?Scheduled Medications: ? aspirin EC  81 mg Oral Daily  ? bisoprolol  2.5 mg Oral Daily  ? Chlorhexidine Gluconate Cloth  6 each Topical Daily  ? clopidogrel  75 mg Oral Daily  ? dapagliflozin propanediol  10 mg Oral Daily  ? fluticasone furoate-vilanterol  1 puff Inhalation Daily  ? insulin aspart  0-9 Units Subcutaneous TID WC  ? living well with diabetes book   Does not apply Once  ? rosuvastatin  40 mg Oral Daily  ? sacubitril-valsartan  1 tablet Oral BID  ? senna  1 tablet Oral Daily  ? sodium chloride flush  10-40 mL Intracatheter Q12H  ? sodium chloride flush  3 mL Intravenous Q12H  ? spironolactone  12.5 mg Oral Daily  ? torsemide  20 mg Oral Daily  ? ? ?Infusions: ? sodium chloride    ? heparin 2,000 Units/hr (10/15/21 1926)  ? ? ?PRN Medications: ?sodium chloride, acetaminophen, fluticasone, ipratropium-albuterol, ondansetron (ZOFRAN) IV, sodium chloride flush, sodium chloride flush ? ? ?Assessment/Plan:  ? ?1.  CAD: NSTEMI, peak troponin 5795.  Patient has history of CAD with prior PCIs.  Coronary angiography today showed complex proximal LAD disease with serial 90% stenoses separated by small aneurysmal areas.  He had a 60% mid LCx stenosis.  RCA with mild disease.  With wheezing and  marked volume overload, it was decided to try to optimized his CHF and COPD prior to complex LAD intervention.  He is not thought to be a CABG candidate with severe LV dysfunction, low PAPI, and COPD. No chest pain.  ?- Plan tentatively for complex PCI to LAD on Wednesday with Dr. Fletcher Anon.  ?- Continue DAPT  He will need to be on anticoagulation due to previous DVT/PE, so will use Plavix rather than Brilinta.  ?- Continue Crestor 40 mg daily.  ?- heparin gtt for now.  ?2. 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  Echo with EF 20-25%, severe LV dilation, RV mildly dysfunctional.  RHC with markedly elevated filling pressures, CI 2.03, and PAPI < 1.  Creatinine stable 1.24. CVP 5 today. Co-ox good at 66%.  ?- Continue torsemide 20 mg daily.  ?- Continue bisoprolol 2.5 mg daily (beta-1 selective with COPD).  ?- Transition from losartan to Entresto 24/26 bid.   ?- Continue dapagliflozin 10 mg daily.  ?- Continue spironolactone 12.5 mg daily.   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  Will cover with heparin gtt for now.  ?- Apixaban after PCI.  ?4. COPD: Audible wheezing has resolved.  ?- Needs to quit smoking.  ?- Off steroids, continue inhalers.  ?5. Thrombocytopenia: Mild, chronic.  ?6. DM2: new diagnosis. Hgba1c 7.0 ?- continue SGLT2i ?- cover with SSI ?- [facility] ? ?Ambulate in hall.  ? ? ?Length of Stay: 3 ? ? ?Loralie Champagne MD ?10/16/2021, 9:30 AM ? ?Advanced Heart Failure Team ?Pager [phone] (M-F; 7a - 4p)  ?Please contact [facility] for night-coverage after hours (4p -7a ) and weekends on [email or web address]  ?

## 2021-10-16 NOTE — TOC Benefit Eligibility Note (Signed)
Patient Advocate Encounter ? ?Insurance verification completed.   ? ?The patient is currently admitted and upon discharge could be taking Entresto 24-26 mg. ? ?The current 30 day co-pay is, $38.00.  ? ?The patient is currently admitted and upon discharge could be taking Jardiance 10 mg. ? ?The current 30 day co-pay is, $38.00.  ? ?The patient is currently admitted and upon discharge could be taking Farxiga '10mg'$ . ? ?Requires Prior Authorization ? ?The patient is insured through [employer] ([place] is the only one who takes [employer])  ? ? ?Lyndel Safe, CPhT ?Pharmacy Patient Advocate Specialist ?[place] Patient Advocate Team ?Direct Number: [phone]  Fax: [phone] ? ? ? ? ? ?  ?

## 2021-10-16 NOTE — Progress Notes (Signed)
CARDIAC REHAB PHASE I  ? ?PRE:  Rate/Rhythm: 79 SR ? ?  BP: sitting 114/63 ? ?  SaO2: 94 2L ? ?MODE:  Ambulation: 410 ft  ? ?POST:  Rate/Rhythm: 84 SR ? ?  BP: sitting 132/67  ? ?  SaO2: 88-91 2L ? ?Pt moving well. DOE with ambulation but declined rest. SaO2 maintained 88-91 2L. Sts he previously had O2 at home but not in the last few months. To recliner. Discussed MI and gave pt diet sheets, including DM. Will f/u. ?[phone]  ? ?[place], ACSM ?10/16/2021 ?11:34 AM ? ? ? ? ?

## 2021-10-16 NOTE — Care Management Important Message (Signed)
Important Message ? ?Patient Details  ?Name: Paul Bullock ?MRN: [id number] ?Date of Birth: 1946-01-24 ? ? ?Medicare Important Message Given:  Yes ? ? ? ? ?Ilian Wessell ?10/16/2021, 3:25 PM ?

## 2021-10-16 NOTE — Progress Notes (Signed)
ANTICOAGULATION CONSULT NOTE  Pharmacy Consult for heparin Indication:  NSTEMI, hx VTE  No Known Allergies  Patient Measurements: Weight: 112.4 kg (247 lb 12.8 oz) Heparin Dosing Weight: 97kg  Vital Signs: Temp: 97.7 F (36.5 C) (03/13 0352) Temp Source: Oral (03/13 0352) BP: 140/75 (03/13 0740) Pulse Rate: 89 (03/13 0740)  Labs: Recent Labs    10/14/21 0504 10/14/21 1400 10/15/21 0430 10/15/21 1621 10/16/21 0500  HGB 16.9  --  16.8  --  18.2*  HCT 50.3  --  51.7  --  53.9*  PLT 96*  --  94*  --  99*  APTT 49*   < > 101* 105* 92*  HEPARINUNFRC 0.40  --  1.00* >1.10* 1.03*  CREATININE 1.20  --  1.19  --  1.24   < > = values in this interval not displayed.     Estimated Creatinine Clearance: 66.6 mL/min (by C-G formula based on SCr of 1.24 mg/dL).   Medical History: Past Medical History:  Diagnosis Date   AICD (automatic cardioverter/defibrillator) present    a. 11/2014 s/p MDT DDMB 1D4 [facility] MRI XT DR AICD (ser# [id number] H).   Bladder cancer ([place])    a. 2013 s/p chemo/xrt.   CAD (coronary artery disease)    a. 11/2020 s/p MI/cardiac arrest-->DES to LCX x 1; b. 10/2011 Cath: LAD 78mw/ aneurysmal post-stenotic dilation-->med rx. LCX 50d to prior stent; c. 10/2021 NSTEMI/Cath: LM 30ost/m, LAD 90p/m, 969mLCX 20p/m, patent stent, LPAV 60, RCA mild diff dzs, EF<25%, LVEDP 4028m.   Chronic HFrEF (heart failure with reduced ejection fraction) (HCC)    a. EF prev as low as 20% w/ subsequent improvement to 50-55%; b. 11/2020 limited echo: "windows were challenging...LVEF largely preserved."; c. 10/2021 LV gram: EF <25%, LVEDP 42m31m   COPD (chronic obstructive pulmonary disease) (HCC)    Depression    Diverticulitis    DVT (deep venous thrombosis) (HCC)[place] Essential hypertension    Hyperlipidemia LDL goal <70    Ischemic cardiomyopathy    a. EF prev as low as 20%; b. 11/2014 s/p MDT [id number] MRI XT DR AICD; c. subsequent improvement in EF to 50-55%; d. 11/2020 limited  echo: "windows were challenging...LVEF largely preserved."; e. 10/2021 LV Gram: EF <25%.   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   Thrombocytopenia (HCC)    Tobacco abuse    Varicose veins of left lower extremity      Assessment: 75 y28 admitted with NSTEMI s/p cath. Pt has hx VTE on apixaban PTA, holding for heparin while determining PCI plans.  Heparin level elevated as expected at 1.03, aPTT within therapeutic goal at 92, on heparin infusion at 2000 units/hr. Hgb 18.2, plt 99. No s/sx of bleeding or infusion issues.   Goal of Therapy:  Heparin level 0.3-0.7 units/ml aPTT 66-102 seconds Monitor platelets by anticoagulation protocol: Yes   Plan:  Continue heparin infusion at  2000 units/h Monitor daily heparin level and aPTT until correlate with AM labs Monitor for s/sx of bleeding   KimbAntonietta JewelarmD, BCCCDillonrmacist  Phone: [phone]/2023 8:40 AM  Please check AMION for all [facility] numbers After 10:00 PM, call [facility]-[phone]

## 2021-10-17 LAB — BASIC METABOLIC PANEL
Anion gap: 7 (ref 5–15)
BUN: 23 mg/dL (ref 8–23)
CO2: 36 mmol/L — ABNORMAL HIGH (ref 22–32)
Calcium: 9 mg/dL (ref 8.9–10.3)
Chloride: 96 mmol/L — ABNORMAL LOW (ref 98–111)
Creatinine, Ser: 1.02 mg/dL (ref 0.61–1.24)
GFR, Estimated: >60 mL/min (ref >60)
Glucose, Bld: 140 mg/dL — ABNORMAL HIGH (ref 70–99)
Potassium: 3.9 mmol/L (ref 3.5–5.1)
Sodium: 139 mmol/L (ref 135–145)

## 2021-10-17 LAB — GLUCOSE, CAPILLARY
Glucose-Capillary: 130 mg/dL — ABNORMAL HIGH (ref 70–99)
Glucose-Capillary: 300 mg/dL — ABNORMAL HIGH (ref 70–99)
Glucose-Capillary: 87 mg/dL (ref 70–99)
Glucose-Capillary: 100 mg/dL — ABNORMAL HIGH (ref 70–99)

## 2021-10-17 LAB — COOXEMETRY PANEL
Carboxyhemoglobin: 1.5 % (ref 0.5–1.5)
Methemoglobin: 0.7 % (ref 0.0–1.5)
O2 Saturation: 64.4 %
Total hemoglobin: 18.4 g/dL — ABNORMAL HIGH (ref 12.0–16.0)

## 2021-10-17 LAB — CBC
HCT: 53 % — ABNORMAL HIGH (ref 39.0–52.0)
Hemoglobin: 17.5 g/dL — ABNORMAL HIGH (ref 13.0–17.0)
MCH: 31.1 pg (ref 26.0–34.0)
MCHC: 33 g/dL (ref 30.0–36.0)
MCV: 94.1 fL (ref 80.0–100.0)
Platelets: 95 10*3/uL — ABNORMAL LOW (ref 150–400)
RBC: 5.63 MIL/uL (ref 4.22–5.81)
RDW: 12.1 % (ref 11.5–15.5)
WBC: 8.1 10*3/uL (ref 4.0–10.5)
nRBC: 0 % (ref 0.0–0.2)

## 2021-10-17 LAB — HEPARIN LEVEL (UNFRACTIONATED)
Heparin Unfractionated: 0.76 IU/mL — ABNORMAL HIGH (ref 0.30–0.70)
Heparin Unfractionated: 0.6 IU/mL (ref 0.30–0.70)

## 2021-10-17 LAB — APTT
aPTT: 106 seconds — ABNORMAL HIGH (ref 24–36)
aPTT: 98 seconds — ABNORMAL HIGH (ref 24–36)

## 2021-10-17 MED ORDER — ASPIRIN 81 MG PO CHEW
81.0000 mg | CHEWABLE_TABLET | ORAL | Status: AC
  Administered 2021-10-18: 81 mg via ORAL
  Filled 2021-10-17: qty 1

## 2021-10-17 MED ORDER — SODIUM CHLORIDE 0.9% FLUSH: 3.0000 mL | INTRAVENOUS | Status: DC | PRN

## 2021-10-17 MED ORDER — SODIUM CHLORIDE 0.9% FLUSH
3.0000 mL | Freq: Two times a day (BID) | INTRAVENOUS | Status: DC
  Administered 2021-10-17 – 2021-10-18 (×3): 3 mL via INTRAVENOUS

## 2021-10-17 MED ORDER — SODIUM CHLORIDE 0.9 % IV SOLN: INTRAVENOUS | Status: DC

## 2021-10-17 MED ORDER — SPIRONOLACTONE 25 MG PO TABS
25.0000 mg | ORAL_TABLET | Freq: Every day | ORAL | Status: DC
  Administered 2021-10-17 – 2021-10-19 (×3): 25 mg via ORAL
  Filled 2021-10-17 (×3): qty 1

## 2021-10-17 MED ORDER — CLOPIDOGREL BISULFATE 75 MG PO TABS
75.0000 mg | ORAL_TABLET | ORAL | Status: AC
  Administered 2021-10-18: 75 mg via ORAL
  Filled 2021-10-17: qty 1

## 2021-10-17 MED ORDER — SODIUM CHLORIDE 0.9 % IV SOLN: 250.0000 mL | INTRAVENOUS | Status: DC | PRN

## 2021-10-17 MED ORDER — TORSEMIDE 20 MG PO TABS
20.0000 mg | ORAL_TABLET | Freq: Every day | ORAL | Status: DC
  Administered 2021-10-19: 20 mg via ORAL
  Filled 2021-10-17: qty 1

## 2021-10-17 MED ORDER — POTASSIUM CHLORIDE CRYS ER 20 MEQ PO TBCR
20.0000 meq | EXTENDED_RELEASE_TABLET | Freq: Once | ORAL | Status: AC
  Administered 2021-10-17: 20 meq via ORAL
  Filled 2021-10-17: qty 1

## 2021-10-17 NOTE — Progress Notes (Signed)
Mobility Specialist Progress Note: ? ? 10/17/21 1455  ?Mobility  ?Activity Ambulated with assistance in hallway  ?Level of Assistance Standby assist, set-up cues, supervision of patient - no hands on  ?Assistive Device None  ?Distance Ambulated (ft) 500 ft  ?Activity Response Tolerated well  ?$Mobility charge 1 Mobility  ? ? ?Pre Mobility: 93% RA ?During Mobility: 88-90% RA ?Post Mobility: 91% RA ? ?Pt received eager for mobility session. Required no physical assist. VSS on RA. Pt left in bed with all needs met.  ? ?[place] Numbers ?Acute Rehab ?Phone: [phone] ?Office Phone: [phone] ? ?

## 2021-10-17 NOTE — Progress Notes (Signed)
ANTICOAGULATION CONSULT NOTE ? ?Pharmacy Consult for heparin ?Indication:  NSTEMI, hx VTE ? ?No Known Allergies ? ?Patient Measurements: ?Height: '5\' 10"'$  (177.8 cm) ?Weight: 112.2 kg (247 lb 5.7 oz) ?IBW/kg (Calculated) : 73 ?Heparin Dosing Weight: 97kg ? ?Vital Signs: ?Temp: 97.8 ?F (36.6 ?C) (03/14 1058) ?Temp Source: Oral (03/14 1058) ?BP: 116/61 (03/14 1058) ? ?Labs: ?Recent Labs  ?  10/15/21 ?0430 10/15/21 ?1621 10/16/21 ?0500 10/17/21 ?0500 10/17/21 ?1515  ?HGB 16.8  --  18.2* 17.5*  --   ?HCT 51.7  --  53.9* 53.0*  --   ?PLT 94*  --  99* 95*  --   ?APTT 101*   < > 92* 106* 98*  ?HEPARINUNFRC 1.00*   < > 1.03* 0.76* 0.60  ?CREATININE 1.19  --  1.24 1.02  --   ? < > = values in this interval not displayed.  ? ? ? ?Estimated Creatinine Clearance: 80.9 mL/min (by C-G formula based on SCr of 1.02 mg/dL). ? ? ?Medical History: ?Past Medical History:  ?Diagnosis Date  ? AICD (automatic cardioverter/defibrillator) present   ? a. 11/2014 s/p MDT DDMB 1D4 [facility] MRI XT DR AICD (ser# [id number] H).  ? Bladder cancer ([place])   ? a. 2013 s/p chemo/xrt.  ? CAD (coronary artery disease)   ? a. 11/2020 s/p MI/cardiac arrest-->DES to LCX x 1; b. 10/2011 Cath: LAD 65mw/ aneurysmal post-stenotic dilation-->med rx. LCX 50d to prior stent; c. 10/2021 NSTEMI/Cath: LM 30ost/m, LAD 90p/m, 982mLCX 20p/m, patent stent, LPAV 60, RCA mild diff dzs, EF<25%, LVEDP 4039m.  ? Chronic HFrEF (heart failure with reduced ejection fraction) [place] ? a. EF prev as low as 20% w/ subsequent improvement to 50-55%; b. 11/2020 limited echo: "windows were challenging...LVEF largely preserved."; c. 10/2021 LV gram: EF <25%, LVEDP 48m66m  ? COPD (chronic obstructive pulmonary disease) (HCC)[place]? Depression   ? Diverticulitis   ? DVT (deep venous thrombosis) (HCC)[place]? Essential hypertension   ? Hyperlipidemia LDL goal <70   ? Ischemic cardiomyopathy   ? a. EF prev as low as 20%; b. 11/2014 s/p MDT [id number] MRI XT DR AICD; c. subsequent improvement in EF to  50-55%; d. 11/2020 limited echo: "windows were challenging...LVEF largely preserved."; e. 10/2021 LV Gram: EF <25%.  ? MI (myocardial infarction) (HCC)[place]? Migraines   ? Morbid obesity (HCC)[place]? 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  ? Thrombocytopenia (HCC)[place]? Tobacco abuse   ? Varicose veins of left lower extremity   ? ? ? ?Assessment: ?75 y30 admitted with NSTEMI s/p cath. Pt has hx VTE on apixaban PTA, holding for heparin while determining PCI plans. Last dose 3/9 AM.  ? ?Heparin level 0.6 and aPTT 98 are both therapeutic and correlating on 1900 units/hr. Will follow HL from now on.  ? ? ?Goal of Therapy:  ?Heparin level 0.3 -0.7 IU/ml ?Monitor platelets by anticoagulation protocol: Yes ?  ?Plan:  ?Continue heparin infusion 1900 units/hr  ?Monitor daily heparin level, CBC ?Monitor for signs/symptoms of bleeding  ? ? ?LydiBenetta SpararmD, BCPS, BCCP ?Clinical Pharmacist ? ?Please check AMION for all [facility] numbers ?After 10:00 PM, call [facility]-[phone]

## 2021-10-17 NOTE — Progress Notes (Addendum)
Patient ID: Paul Bullock, male   DOB: 06/20/1946, 76 y.o.   MRN: [id number] ? ? ? ?Advanced Heart Failure Rounding Note ? ? ?Subjective:   ?CVP 5-6 .  ? ?Denies chest pain.  ? ? ?Objective:   ?Weight Range: ? ?Vital Signs:   ?Temp:  [97.9 ?F (36.6 ?C)-98.7 ?F (37.1 ?C)] 97.9 ?F (36.6 ?C) (03/14 0407) ?Pulse Rate:  [72-89] 74 (03/14 0407) ?Resp:  [17-24] 20 (03/14 0407) ?BP: (99-140)/(63-75) 114/73 (03/14 0407) ?SpO2:  [91 %-94 %] 93 % (03/14 0407) ?Weight:  [112.2 kg] 112.2 kg (03/14 0347) ?Last BM Date : 10/15/21 ? ?Weight change: ?Filed Weights  ? 10/15/21 2027 10/16/21 0501 10/17/21 4259  ?Weight: 112.2 kg 112.4 kg 112.2 kg  ? ? ?Intake/Output:  ? ?Intake/Output Summary (Last 24 hours) at 10/17/2021 0724 ?Last data filed at 10/17/2021 5638 ?Gross per 24 hour  ?Intake 240 ml  ?Output 2575 ml  ?Net -2335 ml  ?  ?CVP 5-6  ?Physical Exam: ?General:  Well appearing. No resp difficulty ?HEENT: normal ?Neck: supple. no JVD. Carotids 2+ bilat; no bruits. No lymphadenopathy or thryomegaly appreciated. ?Cor: PMI nondisplaced. Regular rate & rhythm. No rubs, gallops or murmurs. ?Lungs: clear ?Abdomen: soft, nontender, nondistended. No hepatosplenomegaly. No bruits or masses. Good bowel sounds. ?Extremities: no cyanosis, clubbing, rash, edema ?Neuro: alert & orientedx3, cranial nerves grossly intact. moves all 4 extremities w/o difficulty. Affect pleasant ? ? ?Telemetry: SR 70-80s  ?Labs: ?Basic Metabolic Panel: ?Recent Labs  ?Lab 10/12/21 ?1927 10/13/21 ?7564 10/14/21 ?3329 10/15/21 ?0430 10/16/21 ?0500 10/17/21 ?0500  ?NA  --  140 137 138 141 139  ?K  --  3.6 4.1 4.3 3.9 3.9  ?CL  --  106 98 97* 95* 96*  ?CO2  --  29 30 33* 37* 36*  ?GLUCOSE  --  197* 209* 202* 106* 140*  ?BUN  --  17 24* 25* 27* 23  ?CREATININE  --  1.04 1.20 1.19 1.24 1.02  ?CALCIUM  --  8.7* 9.0 9.1 9.1 9.0  ?MG 1.9  --   --   --   --   --   ? ? ?Liver Function Tests: ?Recent Labs  ?Lab 10/12/21 ?1707  ?AST 59*  ?ALT 23  ?ALKPHOS 76  ?BILITOT 2.7*  ?PROT  7.2  ?ALBUMIN 3.6  ? ?No results for input(s): LIPASE, AMYLASE in the last 168 hours. ?No results for input(s): AMMONIA in the last 168 hours. ? ?CBC: ?Recent Labs  ?Lab 10/12/21 ?1707 10/13/21 ?5188 10/14/21 ?4166 10/15/21 ?0430 10/16/21 ?0500 10/17/21 ?0500  ?WBC 9.1 9.2 14.2* 12.2* 10.5 8.1  ?NEUTROABS 7.0  --  12.2*  --   --   --   ?HGB 18.5* 17.2* 16.9 16.8 18.2* 17.5*  ?HCT 54.8* 52.8* 50.3 51.7 53.9* 53.0*  ?MCV 92.3 95.1 95.1 96.6 95.7 94.1  ?PLT 110* 100* 96* 94* 99* 95*  ? ? ?Cardiac Enzymes: ?No results for input(s): CKTOTAL, CKMB, CKMBINDEX, TROPONINI in the last 168 hours. ? ?BNP: ?BNP (last 3 results) ?Recent Labs  ?  10/12/21 ?1707  ?BNP 431.7*  ? ? ?ProBNP (last 3 results) ?No results for input(s): PROBNP in the last 8760 hours. ? ? ? ?Other results: ? ?Imaging: ?No results found. ? ? ?Medications:   ? ? ?Scheduled Medications: ? aspirin EC  81 mg Oral Daily  ? bisoprolol  2.5 mg Oral Daily  ? Chlorhexidine Gluconate Cloth  6 each Topical Daily  ? clopidogrel  75 mg Oral Daily  ? dapagliflozin  propanediol  10 mg Oral Daily  ? fluticasone furoate-vilanterol  1 puff Inhalation Daily  ? insulin aspart  0-9 Units Subcutaneous TID WC  ? living well with diabetes book   Does not apply Once  ? potassium chloride  20 mEq Oral Once  ? rosuvastatin  40 mg Oral Daily  ? sacubitril-valsartan  1 tablet Oral BID  ? senna  1 tablet Oral Daily  ? sodium chloride flush  10-40 mL Intracatheter Q12H  ? sodium chloride flush  3 mL Intravenous Q12H  ? spironolactone  12.5 mg Oral Daily  ? torsemide  20 mg Oral Daily  ? ? ?Infusions: ? sodium chloride    ? heparin 2,000 Units/hr (10/17/21 0501)  ? ? ?PRN Medications: ?sodium chloride, acetaminophen, fluticasone, ipratropium-albuterol, ondansetron (ZOFRAN) IV, sodium chloride flush, sodium chloride flush ? ? ?Assessment/Plan:  ? ?1.  CAD: NSTEMI, peak troponin 5795.  Patient has history of CAD with prior PCIs.  Coronary angiography today showed complex proximal LAD disease  with serial 90% stenoses separated by small aneurysmal areas.  He had a 60% mid LCx stenosis.  RCA with mild disease.  With wheezing and marked volume overload, it was decided to try to optimized his CHF and COPD prior to complex LAD intervention.  He is not thought to be a CABG candidate with severe LV dysfunction, low PAPI, and COPD. No chest pain.  ?- Plan tentatively for complex PCI to LAD on Wednesday with Dr. Fletcher Anon.  ?- Continue DAPT  He will need to be on anticoagulation due to previous DVT/PE, so will use Plavix rather than Brilinta.  ?- Continue Crestor 40 mg daily.  ?- heparin gtt for now.  ?2. 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  Echo with EF 20-25%, severe LV dilation, RV mildly dysfunctional.  RHC with markedly elevated filling pressures, CI 2.03, and PAPI < 1.  Creatinine stable 1. CVP 5-6  today.  ?- Continue torsemide 20 mg daily.  ?- Continue bisoprolol 2.5 mg daily (beta-1 selective with COPD).  ?- Continue Entresto 24/26 bid.   ?- Continue dapagliflozin 10 mg daily.  ?- Continue spironolactone 25 mg daily.  ?- Renal function stable.    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  Will cover with heparin gtt for now.  ?- Apixaban after PCI.  ?4. COPD: Audible wheezing has resolved.  ?- Needs to quit smoking.  ?- Off steroids, continue inhalers.  ?5. Thrombocytopenia: Mild, chronic.  ?Platelets 95 ?6. DM2: new diagnosis. Hgba1c 7.0 ?- continue SGLT2i ?- cover with SSI ?- Likely would benefit from [facility] ? ?Length of Stay: 4 ? ?Amy Clegg NP-C  ?10/17/2021, 7:24 AM ? ?Advanced Heart Failure Team ?Pager [phone] (M-F; 7a - 4p)  ?Please contact [facility] for night-coverage after hours (4p -7a ) and weekends on [email or web address]  ? ?Patient seen with NP, agree with the above note.  ? ?Creatinine 1 this morning, I get CVP 2-3.  No co-ox.  He has no chest pain or dyspnea.  ? ?General: NAD ?Neck: No JVD, no thyromegaly or thyroid nodule.  ?Lungs: Clear to auscultation bilaterally with  normal respiratory effort. ?CV: Nondisplaced PMI.  Heart regular S1/S2, no S3/S4, no murmur.  No peripheral edema.   ?Abdomen: Soft, nontender, no hepatosplenomegaly, no distention.  ?Skin: Intact without lesions or rashes.  ?Neurologic: Alert and oriented x 3.  ?Psych: Normal affect. ?Extremities: No clubbing or cyanosis.  ?HEENT: Normal.  ? ?Plan for PCI to LAD with Dr. Fletcher Anon tomorrow.  Discussed  risks/benefits with patient, he agrees to procedure.  ? ?Tolerating GDMT, will increase spironolactone to 25 mg daily.  Can hold torsemide for now with low CVP, likely restart after PCI.   ? ?Loralie Champagne ?10/17/2021 ?7:37 AM ? ?

## 2021-10-17 NOTE — Progress Notes (Signed)
CARDIAC REHAB PHASE I  ? ?Offered to walk with pt. Pt recently returned from ambulation with mobility tech. Pt denies PC, SOB, or dizziness throughout. Pt denies questions regarding tomorrows procedure. Provided support and encouragement. Will continue to follow throughout his hospital stay. ? ?[phone] ?Rufina Falco, RN BSN ?10/17/2021 ?3:10 PM ? ?

## 2021-10-18 ENCOUNTER — Encounter (HOSPITAL_COMMUNITY): Payer: Self-pay | Admitting: Cardiovascular Disease

## 2021-10-18 ENCOUNTER — Inpatient Hospital Stay (HOSPITAL_COMMUNITY)
Admission: AD | Disposition: A | Discharge status: Home / Self Care | Payer: Self-pay | Source: Other Acute Inpatient Hospital | Attending: Cardiology

## 2021-10-18 DIAGNOSIS — I2510 Atherosclerotic heart disease of native coronary artery without angina pectoris: Secondary | ICD-10-CM

## 2021-10-18 DIAGNOSIS — I214 Non-ST elevation (NSTEMI) myocardial infarction: Principal | ICD-10-CM

## 2021-10-18 HISTORY — PX: CORONARY STENT INTERVENTION: CATH118234

## 2021-10-18 LAB — BASIC METABOLIC PANEL
Anion gap: 7 (ref 5–15)
BUN: 21 mg/dL (ref 8–23)
CO2: 33 mmol/L — ABNORMAL HIGH (ref 22–32)
Calcium: 8.8 mg/dL — ABNORMAL LOW (ref 8.9–10.3)
Chloride: 97 mmol/L — ABNORMAL LOW (ref 98–111)
Creatinine, Ser: 0.97 mg/dL (ref 0.61–1.24)
GFR, Estimated: >60 mL/min (ref >60)
Glucose, Bld: 124 mg/dL — ABNORMAL HIGH (ref 70–99)
Potassium: 3.9 mmol/L (ref 3.5–5.1)
Sodium: 137 mmol/L (ref 135–145)

## 2021-10-18 LAB — GLUCOSE, CAPILLARY
Glucose-Capillary: 119 mg/dL — ABNORMAL HIGH (ref 70–99)
Glucose-Capillary: 122 mg/dL — ABNORMAL HIGH (ref 70–99)
Glucose-Capillary: 167 mg/dL — ABNORMAL HIGH (ref 70–99)
Glucose-Capillary: 142 mg/dL — ABNORMAL HIGH (ref 70–99)

## 2021-10-18 LAB — CBC
HCT: 52.1 % — ABNORMAL HIGH (ref 39.0–52.0)
Hemoglobin: 17.7 g/dL — ABNORMAL HIGH (ref 13.0–17.0)
MCH: 31.8 pg (ref 26.0–34.0)
MCHC: 34 g/dL (ref 30.0–36.0)
MCV: 93.5 fL (ref 80.0–100.0)
Platelets: 95 10*3/uL — ABNORMAL LOW (ref 150–400)
RBC: 5.57 MIL/uL (ref 4.22–5.81)
RDW: 11.9 % (ref 11.5–15.5)
WBC: 7.4 10*3/uL (ref 4.0–10.5)
nRBC: 0 % (ref 0.0–0.2)

## 2021-10-18 LAB — COOXEMETRY PANEL
Carboxyhemoglobin: 1.7 % — ABNORMAL HIGH (ref 0.5–1.5)
Methemoglobin: 1.4 % (ref 0.0–1.5)
O2 Saturation: 65.2 %
Total hemoglobin: 18.1 g/dL — ABNORMAL HIGH (ref 12.0–16.0)

## 2021-10-18 LAB — HEPARIN LEVEL (UNFRACTIONATED): Heparin Unfractionated: 0.56 IU/mL (ref 0.30–0.70)

## 2021-10-18 LAB — POCT ACTIVATED CLOTTING TIME
Activated Clotting Time: 0 seconds
Activated Clotting Time: 546 seconds

## 2021-10-18 SURGERY — CORONARY STENT INTERVENTION
Anesthesia: LOCAL

## 2021-10-18 MED ORDER — HEPARIN (PORCINE) IN NACL 1000-0.9 UT/500ML-% IV SOLN
INTRAVENOUS | Status: DC | PRN
  Administered 2021-10-18 (×2): 500 mL

## 2021-10-18 MED ORDER — NITROGLYCERIN 1 MG/10 ML FOR IR/CATH LAB
INTRA_ARTERIAL | Status: AC
  Filled 2021-10-18: qty 10

## 2021-10-18 MED ORDER — LIDOCAINE HCL (PF) 1 % IJ SOLN
INTRAMUSCULAR | Status: DC | PRN
  Administered 2021-10-18: 3 mL

## 2021-10-18 MED ORDER — SODIUM CHLORIDE 0.9% FLUSH: 3.0000 mL | Freq: Two times a day (BID) | INTRAVENOUS | Status: DC

## 2021-10-18 MED ORDER — HEPARIN SODIUM (PORCINE) 1000 UNIT/ML IJ SOLN
INTRAMUSCULAR | Status: AC
  Filled 2021-10-18: qty 10

## 2021-10-18 MED ORDER — SODIUM CHLORIDE 0.9% FLUSH: 3.0000 mL | INTRAVENOUS | Status: DC | PRN

## 2021-10-18 MED ORDER — HEPARIN (PORCINE) IN NACL 1000-0.9 UT/500ML-% IV SOLN
INTRAVENOUS | Status: AC
  Filled 2021-10-18: qty 500

## 2021-10-18 MED ORDER — IOHEXOL 350 MG/ML SOLN
INTRAVENOUS | Status: DC | PRN
  Administered 2021-10-18: 155 mL

## 2021-10-18 MED ORDER — MIDAZOLAM HCL 2 MG/2ML IJ SOLN
INTRAMUSCULAR | Status: AC
  Filled 2021-10-18: qty 2

## 2021-10-18 MED ORDER — HEPARIN SODIUM (PORCINE) 1000 UNIT/ML IJ SOLN
INTRAMUSCULAR | Status: DC | PRN
  Administered 2021-10-18: 10000 [IU] via INTRAVENOUS

## 2021-10-18 MED ORDER — VERAPAMIL HCL 2.5 MG/ML IV SOLN
INTRAVENOUS | Status: DC | PRN
  Administered 2021-10-18: 10 mL via INTRA_ARTERIAL

## 2021-10-18 MED ORDER — MIDAZOLAM HCL 2 MG/2ML IJ SOLN
INTRAMUSCULAR | Status: DC | PRN
  Administered 2021-10-18: 1 mg via INTRAVENOUS

## 2021-10-18 MED ORDER — SODIUM CHLORIDE 0.9 % IV SOLN: INTRAVENOUS | Status: AC

## 2021-10-18 MED ORDER — POTASSIUM CHLORIDE CRYS ER 20 MEQ PO TBCR
20.0000 meq | EXTENDED_RELEASE_TABLET | Freq: Once | ORAL | Status: AC
  Administered 2021-10-18: 20 meq via ORAL
  Filled 2021-10-18: qty 1

## 2021-10-18 MED ORDER — LIDOCAINE HCL (PF) 1 % IJ SOLN
INTRAMUSCULAR | Status: AC
  Filled 2021-10-18: qty 30

## 2021-10-18 MED ORDER — SODIUM CHLORIDE 0.9 % IV SOLN: 250.0000 mL | INTRAVENOUS | Status: DC | PRN

## 2021-10-18 MED ORDER — HEPARIN (PORCINE) IN NACL 1000-0.9 UT/500ML-% IV SOLN
INTRAVENOUS | Status: AC
Start: 2021-10-18 — End: ?
  Filled 2021-10-18: qty 500

## 2021-10-18 MED ORDER — FENTANYL CITRATE (PF) 100 MCG/2ML IJ SOLN
INTRAMUSCULAR | Status: AC
  Filled 2021-10-18: qty 2

## 2021-10-18 MED ORDER — VERAPAMIL HCL 2.5 MG/ML IV SOLN
INTRAVENOUS | Status: AC
  Filled 2021-10-18: qty 2

## 2021-10-18 MED ORDER — FENTANYL CITRATE (PF) 100 MCG/2ML IJ SOLN
INTRAMUSCULAR | Status: DC | PRN
  Administered 2021-10-18: 25 ug via INTRAVENOUS

## 2021-10-18 MED ORDER — EMPAGLIFLOZIN 10 MG PO TABS
10.0000 mg | ORAL_TABLET | Freq: Every day | ORAL | Status: DC
  Administered 2021-10-18 – 2021-10-19 (×2): 10 mg via ORAL
  Filled 2021-10-18 (×2): qty 1

## 2021-10-18 SURGICAL SUPPLY — 21 items
BALLN SAPPHIRE 2.5X20 (BALLOONS) ×2
BALLN SCOREFLEX 2.75X15 (BALLOONS) ×2
BALLN ~~LOC~~ EMERGE MR 3.5X20 (BALLOONS) ×2
BALLOON SAPPHIRE 2.5X20 (BALLOONS) IMPLANT
BALLOON SCOREFLEX 2.75X15 (BALLOONS) IMPLANT
BALLOON ~~LOC~~ EMERGE MR 3.5X20 (BALLOONS) IMPLANT
CATH LAUNCHER 6FR AL1 (CATHETERS) IMPLANT
CATHETER LAUNCHER 6FR AL1 (CATHETERS) ×2
DEVICE RAD COMP TR BAND LRG (VASCULAR PRODUCTS) ×1 IMPLANT
GLIDESHEATH SLEND SS 6F .021 (SHEATH) ×1 IMPLANT
GUIDEWIRE INQWIRE 1.5J.035X260 (WIRE) IMPLANT
INQWIRE 1.5J .035X260CM (WIRE) ×2
KIT ENCORE 26 ADVANTAGE (KITS) ×1 IMPLANT
KIT HEART LEFT (KITS) ×2 IMPLANT
PACK CARDIAC CATHETERIZATION (CUSTOM PROCEDURE TRAY) ×2 IMPLANT
SHEATH PINNACLE 6F 10CM (SHEATH) IMPLANT
STENT ONYX FRONTIER 2.5X34 (Permanent Stent) ×1 IMPLANT
STENT ONYX FRONTIER 3.0X22 (Permanent Stent) ×1 IMPLANT
TRANSDUCER W/STOPCOCK (MISCELLANEOUS) ×2 IMPLANT
TUBING CIL FLEX 10 FLL-RA (TUBING) ×2 IMPLANT
WIRE RUNTHROUGH .014X180CM (WIRE) ×1 IMPLANT

## 2021-10-18 NOTE — Progress Notes (Signed)
Discussed with pt MI, stents, Plavix, smoking cessation, diet, exercise, NTG, daily wts/signs of fluid, and CRPII. Pt receptive. He likes to do his own research. He sts he has quit smoking and it is harmful to discuss it further. Left all materials and will refer to [place].  ?[phone] ?Yves Dill CES, ACSM ?3:14 PM ?10/18/2021 ? ?

## 2021-10-18 NOTE — Interval H&P Note (Signed)
Cath Lab Visit (complete for each Cath Lab visit) ? ?Clinical Evaluation Leading to the Procedure:  ? ?ACS: Yes.   ? ?Non-ACS:  n/a ? ? ?History and Physical Interval Note: ? ?10/18/2021 ?11:14 AM ? ?Paul Bullock  has presented today for surgery, with the diagnosis of nstemi.  The various methods of treatment have been discussed with the patient and family. After consideration of risks, benefits and other options for treatment, the patient has consented to  Procedure(s): ?CORONARY STENT INTERVENTION (N/A) as a surgical intervention.  The patient's history has been reviewed, patient examined, no change in status, stable for surgery.  I have reviewed the patient's chart and labs.  Questions were answered to the patient's satisfaction.   ? ? ?Kathlyn Sacramento ? ? ?

## 2021-10-18 NOTE — Progress Notes (Signed)
Mobility Specialist Progress Note  ? ? 10/18/21 1646  ?Mobility  ?Activity Ambulated independently in hallway  ?Level of Assistance Standby assist, set-up cues, supervision of patient - no hands on  ?Assistive Device None  ?Distance Ambulated (ft) 420 ft  ?Activity Response Tolerated well  ?$Mobility charge 1 Mobility  ? ?Pt received in bed and agreeable. No complaints during walk. Returned to [facility] for BM. Advised to pull cord if needing anything and RN made aware.  ? ?Paul Bullock ?Mobility Specialist  ?M.S. 5N: [id number]  ?

## 2021-10-18 NOTE — Progress Notes (Addendum)
Patient ID: Paul Bullock, male   DOB: 02-07-1946, 76 y.o.   MRN: [id number] ? ? ? ?Advanced Heart Failure Rounding Note ? ? ?Subjective:   ? ? ?Feels ok. Denies chest pain.  ? ? ? ? ?Objective:   ?Weight Range: ? ?Vital Signs:   ?Temp:  [97.7 ?F (36.5 ?C)-98.6 ?F (37 ?C)] 98.6 ?F (37 ?C) (03/15 0725) ?Resp:  [17-21] 21 (03/15 0725) ?BP: (94-116)/(46-66) 96/48 (03/15 0725) ?SpO2:  [93 %-97 %] 95 % (03/15 0725) ?Weight:  [112.8 kg] 112.8 kg (03/15 0537) ?Last BM Date : 10/17/21 ? ?Weight change: ?Filed Weights  ? 10/16/21 0501 10/17/21 4401 10/18/21 0537  ?Weight: 112.4 kg 112.2 kg 112.8 kg  ? ? ?Intake/Output:  ? ?Intake/Output Summary (Last 24 hours) at 10/18/2021 0910 ?Last data filed at 10/18/2021 [phone] ?Gross per 24 hour  ?Intake 2464.3 ml  ?Output 1775 ml  ?Net 689.3 ml  ?  ?CVP 4  ?Physical Exam: ?General:  . No resp difficulty ?HEENT: normal ?Neck: supple. no JVD. Carotids 2+ bilat; no bruits. No lymphadenopathy or thryomegaly appreciated. ?Cor: PMI nondisplaced. Regular rate & rhythm. No rubs, gallops or murmurs. ?Lungs: clear ?Abdomen: soft, nontender, nondistended. No hepatosplenomegaly. No bruits or masses. Good bowel sounds. ?Extremities: no cyanosis, clubbing, rash, R and LLE teds hose. No edema ?Neuro: alert & orientedx3, cranial nerves grossly intact. moves all 4 extremities w/o difficulty. Affect pleasant ? ? ?Telemetry: SR 70s  ?Labs: ?Basic Metabolic Panel: ?Recent Labs  ?Lab 10/12/21 ?1927 10/13/21 ?5366 10/14/21 ?4403 10/15/21 ?0430 10/16/21 ?0500 10/17/21 ?0500 10/18/21 ?0355  ?NA  --    < > 137 138 141 139 137  ?K  --    < > 4.1 4.3 3.9 3.9 3.9  ?CL  --    < > 98 97* 95* 96* 97*  ?CO2  --    < > 30 33* 37* 36* 33*  ?GLUCOSE  --    < > 209* 202* 106* 140* 124*  ?BUN  --    < > 24* 25* 27* 23 21  ?CREATININE  --    < > 1.20 1.19 1.24 1.02 0.97  ?CALCIUM  --    < > 9.0 9.1 9.1 9.0 8.8*  ?MG 1.9  --   --   --   --   --   --   ? < > = values in this interval not displayed.  ? ? ?Liver Function  Tests: ?Recent Labs  ?Lab 10/12/21 ?1707  ?AST 59*  ?ALT 23  ?ALKPHOS 76  ?BILITOT 2.7*  ?PROT 7.2  ?ALBUMIN 3.6  ? ?No results for input(s): LIPASE, AMYLASE in the last 168 hours. ?No results for input(s): AMMONIA in the last 168 hours. ? ?CBC: ?Recent Labs  ?Lab 10/12/21 ?1707 10/13/21 ?4742 10/14/21 ?5956 10/15/21 ?0430 10/16/21 ?0500 10/17/21 ?0500 10/18/21 ?0355  ?WBC 9.1   < > 14.2* 12.2* 10.5 8.1 7.4  ?NEUTROABS 7.0  --  12.2*  --   --   --   --   ?HGB 18.5*   < > 16.9 16.8 18.2* 17.5* 17.7*  ?HCT 54.8*   < > 50.3 51.7 53.9* 53.0* 52.1*  ?MCV 92.3   < > 95.1 96.6 95.7 94.1 93.5  ?PLT 110*   < > 96* 94* 99* 95* 95*  ? < > = values in this interval not displayed.  ? ? ?Cardiac Enzymes: ?No results for input(s): CKTOTAL, CKMB, CKMBINDEX, TROPONINI in the last 168 hours. ? ?BNP: ?BNP (last 3 results) ?  Recent Labs  ?  10/12/21 ?1707  ?BNP 431.7*  ? ? ?ProBNP (last 3 results) ?No results for input(s): PROBNP in the last 8760 hours. ? ? ? ?Other results: ? ?Imaging: ?No results found. ? ? ?Medications:   ? ? ?Scheduled Medications: ? aspirin EC  81 mg Oral Daily  ? bisoprolol  2.5 mg Oral Daily  ? Chlorhexidine Gluconate Cloth  6 each Topical Daily  ? clopidogrel  75 mg Oral Daily  ? dapagliflozin propanediol  10 mg Oral Daily  ? fluticasone furoate-vilanterol  1 puff Inhalation Daily  ? insulin aspart  0-9 Units Subcutaneous TID WC  ? living well with diabetes book   Does not apply Once  ? rosuvastatin  40 mg Oral Daily  ? sacubitril-valsartan  1 tablet Oral BID  ? senna  1 tablet Oral Daily  ? sodium chloride flush  10-40 mL Intracatheter Q12H  ? sodium chloride flush  3 mL Intravenous Q12H  ? sodium chloride flush  3 mL Intravenous Q12H  ? spironolactone  25 mg Oral Daily  ? [START ON 10/19/2021] torsemide  20 mg Oral Daily  ? ? ?Infusions: ? sodium chloride    ? sodium chloride    ? sodium chloride 10 mL/hr at 10/18/21 0644  ? heparin 1,900 Units/hr (10/18/21 0341)  ? ? ?PRN Medications: ?sodium chloride, sodium  chloride, acetaminophen, fluticasone, ipratropium-albuterol, ondansetron (ZOFRAN) IV, sodium chloride flush, sodium chloride flush, sodium chloride flush ? ? ?Assessment/Plan:  ? ?1.  CAD: NSTEMI, peak troponin 5795.  Patient has history of CAD with prior PCIs.  Coronary angiography today showed complex proximal LAD disease with serial 90% stenoses separated by small aneurysmal areas.  He had a 60% mid LCx stenosis.  RCA with mild disease.  With wheezing and marked volume overload, it was decided to try to optimized his CHF and COPD prior to complex LAD intervention.  He is not thought to be a CABG candidate with severe LV dysfunction, low PAPI, and COPD. No chest pain.  ?- Plan  for complex PCI to LAD today by Dr. Fletcher Anon.  ?- Continue DAPT  He will need to be on anticoagulation due to previous DVT/PE, on plavix rather than Brilinta.  ?- Continue Crestor 40 mg daily.  ?- heparin gtt for now.  ?2. 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  Echo with EF 20-25%, severe LV dilation, RV mildly dysfunctional.  RHC with markedly elevated filling pressures, CI 2.03, and PAPI < 1.  Creatinine stable 1. ?- CVP 4-5. Renal  function stable.  ?- Continue torsemide 20 mg daily.  ?- Continue bisoprolol 2.5 mg daily (beta-1 selective with COPD).  ?- Continue Entresto 24/26 bid.   ?- Continue dapagliflozin 10 mg daily.  ?- Continue spironolactone 25 mg daily.  ?-3. 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  Will cover with heparin gtt for now.  ?- Apixaban after PCI.  ?4. COPD: Audible wheezing has resolved.  ?- Needs to quit smoking.  ?- Off steroids, continue inhalers.  ?5. Thrombocytopenia: Mild, chronic.  ?Platelets 95 ?6. DM2: new diagnosis. Hgba1c 7.0 ?- continue SGLT2i ?- cover with SSI ?- Likely would benefit from [facility] ? ?Length of Stay: 5 ? ?Amy Clegg NP-C  ?10/18/2021, 9:10 AM ? ?Advanced Heart Failure Team ?Pager [phone] (M-F; 7a - 4p)  ?Please contact [facility] for night-coverage after hours (4p -7a  ) and weekends on [email or web address]  ? ?Patient seen with NP, agree with the above note.  ? ?No complaints today, no  chest pain or dyspnea.  CVP 4-5. Co-ox 65%, creatinine 0.97.  ? ?General: NAD ?Neck: No JVD, no thyromegaly or thyroid nodule.  ?Lungs: Distant BS.  ?CV: Nondisplaced PMI.  Heart regular S1/S2, no S3/S4, no murmur.  No peripheral edema.   ?Abdomen: Soft, nontender, no hepatosplenomegaly, no distention.  ?Skin: Intact without lesions or rashes.  ?Neurologic: Alert and oriented x 3.  ?Psych: Normal affect. ?Extremities: No clubbing or cyanosis.  ?HEENT: Normal.  ? ?Going for PCI to LAD later this morning with Dr. Fletcher Anon.  He is currently on Plavix/ASA + heparin gtt.  After discharge, will need apixaban + Plavix, should be able to drop ASA soon.  ? ?SBP around 100, no lightheadedness.  Continue current GDMT, no BP room for titration.  Volume status optimized. No diuretic today, start back tomorrow.  ? ?Loralie Champagne ?10/18/2021 ?9:55 AM ? ?

## 2021-10-18 NOTE — Discharge Instructions (Signed)

## 2021-10-18 NOTE — H&P (View-Only) (Signed)
Patient ID: Paul Bullock, male   DOB: [birth year], 76 y.o.   MRN: [id number] ? ? ? ?Advanced Heart Failure Rounding Note ? ? ?Subjective:   ? ? ?Feels ok. Denies chest pain.  ? ? ? ? ?Objective:   ?Weight Range: ? ?Vital Signs:   ?Temp:  [97.7 ?F (36.5 ?C)-98.6 ?F (37 ?C)] 98.6 ?F (37 ?C) (03/15 0725) ?Resp:  [17-21] 21 (03/15 0725) ?BP: (94-116)/(46-66) 96/48 (03/15 0725) ?SpO2:  [93 %-97 %] 95 % (03/15 0725) ?Weight:  [112.8 kg] 112.8 kg (03/15 0537) ?Last BM Date : 10/17/21 ? ?Weight change: ?Filed Weights  ? 10/16/21 0501 10/17/21 7893 10/18/21 0537  ?Weight: 112.4 kg 112.2 kg 112.8 kg  ? ? ?Intake/Output:  ? ?Intake/Output Summary (Last 24 hours) at 10/18/2021 0910 ?Last data filed at 10/18/2021 [phone] ?Gross per 24 hour  ?Intake 2464.3 ml  ?Output 1775 ml  ?Net 689.3 ml  ?  ?CVP 4  ?Physical Exam: ?General:  . No resp difficulty ?HEENT: normal ?Neck: supple. no JVD. Carotids 2+ bilat; no bruits. No lymphadenopathy or thryomegaly appreciated. ?Cor: PMI nondisplaced. Regular rate & rhythm. No rubs, gallops or murmurs. ?Lungs: clear ?Abdomen: soft, nontender, nondistended. No hepatosplenomegaly. No bruits or masses. Good bowel sounds. ?Extremities: no cyanosis, clubbing, rash, R and LLE teds hose. No edema ?Neuro: alert & orientedx3, cranial nerves grossly intact. moves all 4 extremities w/o difficulty. Affect pleasant ? ? ?Telemetry: SR 70s  ?Labs: ?Basic Metabolic Panel: ?Recent Labs  ?Lab 10/12/21 ?1927 10/13/21 ?7510 10/14/21 ?2585 10/15/21 ?0430 10/16/21 ?0500 10/17/21 ?0500 10/18/21 ?0355  ?NA  --    < > 137 138 141 139 137  ?K  --    < > 4.1 4.3 3.9 3.9 3.9  ?CL  --    < > 98 97* 95* 96* 97*  ?CO2  --    < > 30 33* 37* 36* 33*  ?GLUCOSE  --    < > 209* 202* 106* 140* 124*  ?BUN  --    < > 24* 25* 27* 23 21  ?CREATININE  --    < > 1.20 1.19 1.24 1.02 0.97  ?CALCIUM  --    < > 9.0 9.1 9.1 9.0 8.8*  ?MG 1.9  --   --   --   --   --   --   ? < > = values in this interval not displayed.  ? ? ?Liver Function  Tests: ?Recent Labs  ?Lab 10/12/21 ?1707  ?AST 59*  ?ALT 23  ?ALKPHOS 76  ?BILITOT 2.7*  ?PROT 7.2  ?ALBUMIN 3.6  ? ?No results for input(s): LIPASE, AMYLASE in the last 168 hours. ?No results for input(s): AMMONIA in the last 168 hours. ? ?CBC: ?Recent Labs  ?Lab 10/12/21 ?1707 10/13/21 ?2778 10/14/21 ?2423 10/15/21 ?0430 10/16/21 ?0500 10/17/21 ?0500 10/18/21 ?0355  ?WBC 9.1   < > 14.2* 12.2* 10.5 8.1 7.4  ?NEUTROABS 7.0  --  12.2*  --   --   --   --   ?HGB 18.5*   < > 16.9 16.8 18.2* 17.5* 17.7*  ?HCT 54.8*   < > 50.3 51.7 53.9* 53.0* 52.1*  ?MCV 92.3   < > 95.1 96.6 95.7 94.1 93.5  ?PLT 110*   < > 96* 94* 99* 95* 95*  ? < > = values in this interval not displayed.  ? ? ?Cardiac Enzymes: ?No results for input(s): CKTOTAL, CKMB, CKMBINDEX, TROPONINI in the last 168 hours. ? ?BNP: ?BNP (last 3 results) ?  Recent Labs  ?  10/12/21 ?1707  ?BNP 431.7*  ? ? ?ProBNP (last 3 results) ?No results for input(s): PROBNP in the last 8760 hours. ? ? ? ?Other results: ? ?Imaging: ?No results found. ? ? ?Medications:   ? ? ?Scheduled Medications: ? aspirin EC  81 mg Oral Daily  ? bisoprolol  2.5 mg Oral Daily  ? Chlorhexidine Gluconate Cloth  6 each Topical Daily  ? clopidogrel  75 mg Oral Daily  ? dapagliflozin propanediol  10 mg Oral Daily  ? fluticasone furoate-vilanterol  1 puff Inhalation Daily  ? insulin aspart  0-9 Units Subcutaneous TID WC  ? living well with diabetes book   Does not apply Once  ? rosuvastatin  40 mg Oral Daily  ? sacubitril-valsartan  1 tablet Oral BID  ? senna  1 tablet Oral Daily  ? sodium chloride flush  10-40 mL Intracatheter Q12H  ? sodium chloride flush  3 mL Intravenous Q12H  ? sodium chloride flush  3 mL Intravenous Q12H  ? spironolactone  25 mg Oral Daily  ? [START ON 10/19/2021] torsemide  20 mg Oral Daily  ? ? ?Infusions: ? sodium chloride    ? sodium chloride    ? sodium chloride 10 mL/hr at 10/18/21 0644  ? heparin 1,900 Units/hr (10/18/21 0341)  ? ? ?PRN Medications: ?sodium chloride, sodium  chloride, acetaminophen, fluticasone, ipratropium-albuterol, ondansetron (ZOFRAN) IV, sodium chloride flush, sodium chloride flush, sodium chloride flush ? ? ?Assessment/Plan:  ? ?1.  CAD: NSTEMI, peak troponin 5795.  Patient has history of CAD with prior PCIs.  Coronary angiography today showed complex proximal LAD disease with serial 90% stenoses separated by small aneurysmal areas.  He had a 60% mid LCx stenosis.  RCA with mild disease.  With wheezing and marked volume overload, it was decided to try to optimized his CHF and COPD prior to complex LAD intervention.  He is not thought to be a CABG candidate with severe LV dysfunction, low PAPI, and COPD. No chest pain.  ?- Plan  for complex PCI to LAD today by Dr. Fletcher Anon.  ?- Continue DAPT  He will need to be on anticoagulation due to previous DVT/PE, on plavix rather than Brilinta.  ?- Continue Crestor 40 mg daily.  ?- heparin gtt for now.  ?2. 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  Echo with EF 20-25%, severe LV dilation, RV mildly dysfunctional.  RHC with markedly elevated filling pressures, CI 2.03, and PAPI < 1.  Creatinine stable 1. ?- CVP 4-5. Renal  function stable.  ?- Continue torsemide 20 mg daily.  ?- Continue bisoprolol 2.5 mg daily (beta-1 selective with COPD).  ?- Continue Entresto 24/26 bid.   ?- Continue dapagliflozin 10 mg daily.  ?- Continue spironolactone 25 mg daily.  ?-3. 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  Will cover with heparin gtt for now.  ?- Apixaban after PCI.  ?4. COPD: Audible wheezing has resolved.  ?- Needs to quit smoking.  ?- Off steroids, continue inhalers.  ?5. Thrombocytopenia: Mild, chronic.  ?Platelets 95 ?6. DM2: new diagnosis. Hgba1c 7.0 ?- continue SGLT2i ?- cover with SSI ?- Likely would benefit from [facility] ? ?Length of Stay: 5 ? ?Amy Clegg NP-C  ?10/18/2021, 9:10 AM ? ?Advanced Heart Failure Team ?Pager [phone] (M-F; 7a - 4p)  ?Please contact [facility] for night-coverage after hours (4p -7a  ) and weekends on [email or web address]  ? ?Patient seen with NP, agree with the above note.  ? ?No complaints today, no  chest pain or dyspnea.  CVP 4-5. Co-ox 65%, creatinine 0.97.  ? ?General: NAD ?Neck: No JVD, no thyromegaly or thyroid nodule.  ?Lungs: Distant BS.  ?CV: Nondisplaced PMI.  Heart regular S1/S2, no S3/S4, no murmur.  No peripheral edema.   ?Abdomen: Soft, nontender, no hepatosplenomegaly, no distention.  ?Skin: Intact without lesions or rashes.  ?Neurologic: Alert and oriented x 3.  ?Psych: Normal affect. ?Extremities: No clubbing or cyanosis.  ?HEENT: Normal.  ? ?Going for PCI to LAD later this morning with Dr. Fletcher Anon.  He is currently on Plavix/ASA + heparin gtt.  After discharge, will need apixaban + Plavix, should be able to drop ASA soon.  ? ?SBP around 100, no lightheadedness.  Continue current GDMT, no BP room for titration.  Volume status optimized. No diuretic today, start back tomorrow.  ? ?Loralie Champagne ?10/18/2021 ?9:55 AM ? ?

## 2021-10-18 NOTE — Progress Notes (Signed)
ANTICOAGULATION CONSULT NOTE ? ?Pharmacy Consult for heparin ?Indication:  NSTEMI, hx VTE ? ?No Known Allergies ? ?Patient Measurements: ?Height: '5\' 10"'$  (177.8 cm) ?Weight: 112.8 kg (248 lb 10.9 oz) ?IBW/kg (Calculated) : 73 ?Heparin Dosing Weight: 97kg ? ?Vital Signs: ?Temp: 98.1 ?F (36.7 ?C) (03/15 0341) ?Temp Source: Oral (03/15 0341) ?BP: 110/62 (03/15 0341) ? ?Labs: ?Recent Labs  ?  10/16/21 ?0500 10/17/21 ?0500 10/17/21 ?1515 10/18/21 ?0355  ?HGB 18.2* 17.5*  --  17.7*  ?HCT 53.9* 53.0*  --  52.1*  ?PLT 99* 95*  --  95*  ?APTT 92* 106* 98*  --   ?HEPARINUNFRC 1.03* 0.76* 0.60 0.56  ?CREATININE 1.24 1.02  --  0.97  ? ? ? ?Estimated Creatinine Clearance: 85.3 mL/min (by C-G formula based on SCr of 0.97 mg/dL). ? ? ?Medical History: ?Past Medical History:  ?Diagnosis Date  ? AICD (automatic cardioverter/defibrillator) present   ? a. 11/2014 s/p MDT DDMB 1D4 [facility] MRI XT DR AICD (ser# [id number]).  ? Bladder cancer ([place])   ? a. 2013 s/p chemo/xrt.  ? CAD (coronary artery disease)   ? a. 11/2020 s/p MI/cardiac arrest-->DES to LCX x 1; b. 10/2011 Cath: LAD 68mw/ aneurysmal post-stenotic dilation-->med rx. LCX 50d to prior stent; c. 10/2021 NSTEMI/Cath: LM 30ost/m, LAD 90p/m, 947mLCX 20p/m, patent stent, LPAV 60, RCA mild diff dzs, EF<25%, LVEDP 4030m.  ? 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 a. EF prev as low as 20% w/ subsequent improvement to 50-55%; b. 11/2020 limited echo: "windows were challenging...LVEF largely preserved."; c. 10/2021 LV gram: EF <25%, LVEDP 57m110m  ? COPD (chronic obstructive pulmonary disease) (HCC)[place]? Depression   ? Diverticulitis   ? DVT (deep venous thrombosis) (HCC)[place]? Essential hypertension   ? Hyperlipidemia LDL goal <70   ? Ischemic cardiomyopathy   ? a. EF prev as low as 20%; b. 11/2014 s/p MDT [id number] MRI XT DR AICD; c. subsequent improvement in EF to 50-55%; d. 11/2020 limited echo: "windows were challenging...LVEF largely preserved."; e. 10/2021  LV Gram: EF <25%.  ? MI (myocardial infarction) (HCC)[place]? Migraines   ? Morbid obesity (HCC)[place]? 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  ? Thrombocytopenia (HCC)[place]? Tobacco abuse   ? Varicose veins of left lower extremity   ? ? ? ?Assessment: ?75 y31 admitted with NSTEMI s/p cath. Pt has hx VTE on apixaban PTA, holding for heparin while determining PCI plans. Last dose 3/9 AM.  ? ?Heparin level 0.56 is therapeutic on 1900 units/hr. Hemoglobin and platelets stable, however but platelets low at 95.  ? ?Goal of Therapy:  ?Heparin level 0.3 -0.7 IU/ml ?Monitor platelets by anticoagulation protocol: Yes ?  ?Plan:  ?Continue heparin infusion 1900 units/hr  ?Monitor daily heparin level, CBC ?Monitor for signs/symptoms of bleeding  ? ?JennCathrine MusterarmD ?PGY2 Cardiology Pharmacy Resident ?Phone: [phone]/2023  6:23 AM ? ?Please check [email or web address] for unit-specific pharmacy phone numbers. ? ? ?

## 2021-10-19 LAB — CBC
HCT: 51.6 % (ref 39.0–52.0)
Hemoglobin: 17.7 g/dL — ABNORMAL HIGH (ref 13.0–17.0)
MCH: 32.2 pg (ref 26.0–34.0)
MCHC: 34.3 g/dL (ref 30.0–36.0)
MCV: 94 fL (ref 80.0–100.0)
Platelets: 98 10*3/uL — ABNORMAL LOW (ref 150–400)
RBC: 5.49 MIL/uL (ref 4.22–5.81)
RDW: 12 % (ref 11.5–15.5)
WBC: 8.6 10*3/uL (ref 4.0–10.5)
nRBC: 0 % (ref 0.0–0.2)

## 2021-10-19 LAB — COOXEMETRY PANEL
Carboxyhemoglobin: 1.7 % — ABNORMAL HIGH (ref 0.5–1.5)
Methemoglobin: <0.7 % (ref 0.0–1.5)
O2 Saturation: 60.6 %
Total hemoglobin: 17.9 g/dL — ABNORMAL HIGH (ref 12.0–16.0)

## 2021-10-19 LAB — BASIC METABOLIC PANEL
Anion gap: 8 (ref 5–15)
BUN: 21 mg/dL (ref 8–23)
CO2: 31 mmol/L (ref 22–32)
Calcium: 8.9 mg/dL (ref 8.9–10.3)
Chloride: 97 mmol/L — ABNORMAL LOW (ref 98–111)
Creatinine, Ser: 1.02 mg/dL (ref 0.61–1.24)
GFR, Estimated: >60 mL/min (ref >60)
Glucose, Bld: 119 mg/dL — ABNORMAL HIGH (ref 70–99)
Potassium: 4.6 mmol/L (ref 3.5–5.1)
Sodium: 136 mmol/L (ref 135–145)

## 2021-10-19 LAB — GLUCOSE, CAPILLARY
Glucose-Capillary: 116 mg/dL — ABNORMAL HIGH (ref 70–99)
Glucose-Capillary: 195 mg/dL — ABNORMAL HIGH (ref 70–99)

## 2021-10-19 MED ORDER — TORSEMIDE 20 MG PO TABS: 20.0000 mg | ORAL_TABLET | Freq: Every day | ORAL | 6 refills | Status: DC

## 2021-10-19 MED ORDER — BISOPROLOL FUMARATE 5 MG PO TABS
5.0000 mg | ORAL_TABLET | Freq: Every day | ORAL | Status: DC
  Administered 2021-10-19: 5 mg via ORAL
  Filled 2021-10-19: qty 1

## 2021-10-19 MED ORDER — CLOPIDOGREL BISULFATE 75 MG PO TABS: 75.0000 mg | ORAL_TABLET | Freq: Every day | ORAL | 6 refills | Status: DC

## 2021-10-19 MED ORDER — EMPAGLIFLOZIN 10 MG PO TABS: 10.0000 mg | ORAL_TABLET | Freq: Every day | ORAL | 6 refills | Status: DC

## 2021-10-19 MED ORDER — BISOPROLOL FUMARATE 5 MG PO TABS: 5.0000 mg | ORAL_TABLET | Freq: Every day | ORAL | 6 refills | Status: DC

## 2021-10-19 MED ORDER — SPIRONOLACTONE 25 MG PO TABS
25.0000 mg | ORAL_TABLET | Freq: Every day | ORAL | 6 refills | Status: DC
Start: 2021-10-19 — End: 2021-11-01

## 2021-10-19 MED ORDER — APIXABAN 5 MG PO TABS
5.0000 mg | ORAL_TABLET | Freq: Two times a day (BID) | ORAL | Status: DC
  Administered 2021-10-19: 5 mg via ORAL
  Filled 2021-10-19: qty 1

## 2021-10-19 MED ORDER — SACUBITRIL-VALSARTAN 24-26 MG PO TABS: 1.0000 | ORAL_TABLET | Freq: Two times a day (BID) | ORAL | 6 refills | Status: DC

## 2021-10-19 MED ORDER — ROSUVASTATIN CALCIUM 40 MG PO TABS: 40.0000 mg | ORAL_TABLET | Freq: Every day | ORAL | 6 refills | Status: DC

## 2021-10-19 MED FILL — Nitroglycerin IV Soln 100 MCG/ML in D5W: INTRA_ARTERIAL | Qty: 10 | Status: AC

## 2021-10-19 NOTE — Progress Notes (Addendum)
Patient ID: Paul Bullock, male   DOB: 1945/08/23, 76 y.o.   MRN: [id number] ? ? ? ?Advanced Heart Failure Rounding Note ? ? ?Subjective:   ? ?3/15 s/p Successful high risk /difficult PCI and 2 overlapped drug-eluting stent placement to the mid and proximal LAD. Residual borderline disease in the distal LAD, to be treated medically  ? ?Doing well. Denies CP. No dyspnea. Ambulated w/ CR ok. No exertional symptoms. 2+ radial pulse. VSS. SCr ok, 1.02. CVP 4-6. Co-ox 61%.  ? ? ?Objective:   ?Weight Range: ? ?Vital Signs:   ?Temp:  [97.6 ?F (36.4 ?C)-98.6 ?F (37 ?C)] 98.6 ?F (37 ?C) (03/16 0730) ?Pulse Rate:  [79] 79 (03/15 1631) ?Resp:  [13-27] 27 (03/16 0730) ?BP: (107-134)/(63-78) 134/78 (03/16 0730) ?SpO2:  [94 %-96 %] 96 % (03/16 0730) ?Weight:  [112.9 kg] 112.9 kg (03/16 0425) ?Last BM Date : 10/18/21 ? ?Weight change: ?Filed Weights  ? 10/17/21 0611 10/18/21 0537 10/19/21 0425  ?Weight: 112.2 kg 112.8 kg 112.9 kg  ? ? ?Intake/Output:  ? ?Intake/Output Summary (Last 24 hours) at 10/19/2021 0732 ?Last data filed at 10/19/2021 0410 ?Gross per 24 hour  ?Intake 632.6 ml  ?Output 1570 ml  ?Net -937.4 ml  ?  ?PHYSICAL EXAM: ?CVP 4-6  ?General:  Well appearing. No respiratory difficulty ?HEENT: normal ?Neck: supple. no JVD. Carotids 2+ bilat; no bruits. No lymphadenopathy or thyromegaly appreciated. ?Cor: PMI nondisplaced. Regular rate & rhythm. No rubs, gallops or murmurs. ?Lungs: clear ?Abdomen: soft, nontender, nondistended. No hepatosplenomegaly. No bruits or masses. Good bowel sounds. ?Extremities: no cyanosis, clubbing, rash, edema ?Neuro: alert & oriented x 3, cranial nerves grossly intact. moves all 4 extremities w/o difficulty. Affect pleasant. ? ? ? ?Telemetry: NSR 70s, 8 beat run of NSVT on tele 3/15. No further recurrence.  ? ?Labs: ?Basic Metabolic Panel: ?Recent Labs  ?Lab 10/12/21 ?1927 10/13/21 ?2952 10/15/21 ?0430 10/16/21 ?0500 10/17/21 ?0500 10/18/21 ?0355 10/19/21 ?0415  ?NA  --    < > 138 141 139 137 136   ?K  --    < > 4.3 3.9 3.9 3.9 4.6  ?CL  --    < > 97* 95* 96* 97* 97*  ?CO2  --    < > 33* 37* 36* 33* 31  ?GLUCOSE  --    < > 202* 106* 140* 124* 119*  ?BUN  --    < > 25* 27* '23 21 21  '$ ?CREATININE  --    < > 1.19 1.24 1.02 0.97 1.02  ?CALCIUM  --    < > 9.1 9.1 9.0 8.8* 8.9  ?MG 1.9  --   --   --   --   --   --   ? < > = values in this interval not displayed.  ? ? ?Liver Function Tests: ?Recent Labs  ?Lab 10/12/21 ?1707  ?AST 59*  ?ALT 23  ?ALKPHOS 76  ?BILITOT 2.7*  ?PROT 7.2  ?ALBUMIN 3.6  ? ?No results for input(s): LIPASE, AMYLASE in the last 168 hours. ?No results for input(s): AMMONIA in the last 168 hours. ? ?CBC: ?Recent Labs  ?Lab 10/12/21 ?1707 10/13/21 ?8413 10/14/21 ?2440 10/15/21 ?0430 10/16/21 ?0500 10/17/21 ?0500 10/18/21 ?0355 10/19/21 ?0415  ?WBC 9.1   < > 14.2* 12.2* 10.5 8.1 7.4 8.6  ?NEUTROABS 7.0  --  12.2*  --   --   --   --   --   ?HGB 18.5*   < > 16.9 16.8 18.2* 17.5* 17.7*  17.7*  ?HCT 54.8*   < > 50.3 51.7 53.9* 53.0* 52.1* 51.6  ?MCV 92.3   < > 95.1 96.6 95.7 94.1 93.5 94.0  ?PLT 110*   < > 96* 94* 99* 95* 95* 98*  ? < > = values in this interval not displayed.  ? ? ?Cardiac Enzymes: ?No results for input(s): CKTOTAL, CKMB, CKMBINDEX, TROPONINI in the last 168 hours. ? ?BNP: ?BNP (last 3 results) ?Recent Labs  ?  10/12/21 ?1707  ?BNP 431.7*  ? ? ?ProBNP (last 3 results) ?No results for input(s): PROBNP in the last 8760 hours. ? ? ? ?Other results: ? ?Imaging: ?CARDIAC CATHETERIZATION ? ?Result Date: 10/18/2021 ?  Ost LM to Mid LM lesion is 30% stenosed.   Prox Cx to Mid Cx lesion is 20% stenosed.   LPAV lesion is 60% stenosed.   Dist LAD lesion is 65% stenosed.   Mid LAD lesion is 90% stenosed.   Prox LAD to Mid LAD lesion is 90% stenosed.   A drug-eluting stent was successfully placed using a STENT ONYX FRONTIER 2.5X34.   A drug-eluting stent was successfully placed using a STENT ONYX FRONTIER 3.0X22.   Post intervention, there is a 0% residual stenosis.   Post intervention, there is a  0% residual stenosis. Successful high risk /difficult PCI and 2 overlapped drug-eluting stent placement to the mid and proximal LAD. Difficult procedure due to the presence of 2 large aneurysmal areas as well as heavy calcifications proximally.  There was borderline disease in the distal LAD that was left to be treated medically as the vessel size is small in that area. Recommendations: Continue dual antiplatelet therapy with aspirin and clopidogrel for now. Eliquis can be resumed tomorrow and once Eliquis is resumed, aspirin can be discontinued to minimize risk of bleeding. Continue treatment of heart failure.   ? ? ?Medications:   ? ? ?Scheduled Medications: ? aspirin EC  81 mg Oral Daily  ? bisoprolol  2.5 mg Oral Daily  ? Chlorhexidine Gluconate Cloth  6 each Topical Daily  ? clopidogrel  75 mg Oral Daily  ? empagliflozin  10 mg Oral Daily  ? fluticasone furoate-vilanterol  1 puff Inhalation Daily  ? insulin aspart  0-9 Units Subcutaneous TID WC  ? living well with diabetes book   Does not apply Once  ? rosuvastatin  40 mg Oral Daily  ? sacubitril-valsartan  1 tablet Oral BID  ? senna  1 tablet Oral Daily  ? sodium chloride flush  10-40 mL Intracatheter Q12H  ? sodium chloride flush  3 mL Intravenous Q12H  ? spironolactone  25 mg Oral Daily  ? torsemide  20 mg Oral Daily  ? ? ?Infusions: ? sodium chloride    ? ? ?PRN Medications: ?sodium chloride, acetaminophen, fluticasone, ipratropium-albuterol, ondansetron (ZOFRAN) IV, sodium chloride flush, sodium chloride flush ? ? ?Assessment/Plan:  ? ?1.  CAD: NSTEMI, peak troponin 5795.  Patient has history of CAD with prior PCIs.  Coronary angiography today showed complex proximal LAD disease with serial 90% stenoses separated by small aneurysmal areas.  He had a 60% mid LCx stenosis.  RCA with mild disease.  With wheezing and marked volume overload, it was decided to try to optimized his CHF and COPD prior to complex LAD intervention.  He is not thought to be a CABG  candidate with severe LV dysfunction, low PAPI, and COPD. No chest pain.  ?- S/p complex PCI to prox-mid LAD 3/15 by Dr. Fletcher Anon.  ?- He will  need to be on anticoagulation due to previous DVT/PE, on plavix rather than Brilinta. Per Dr. Fletcher Anon, ok to stop ASA today w/ restart of Eliquis  ?- Continue Crestor 40 mg daily.  ?2. 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  Echo with EF 20-25%, severe LV dilation, RV mildly dysfunctional.  RHC with markedly elevated filling pressures, CI 2.03, and PAPI < 1.  Creatinine stable 1. ?- CVP 4-6. Renal  function stable.  ?- Continue torsemide 20 mg daily.  ?- Continue bisoprolol 2.5 mg daily (beta-1 selective with COPD).  ?- Continue Entresto 24/26 bid.   ?- Continue empagliflozin 10 mg daily.  ?- Continue spironolactone 25 mg daily.  ?-3. 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   ?- Resume Apixaban, now s/p PCI.  ?- Will need Plavix for new DES. No ASA to minimize risk of bleeding  ?4. COPD: Audible wheezing has resolved.  ?- Needs to quit smoking.  ?- Off steroids, continue inhalers.  ?5. Thrombocytopenia: Mild, chronic.  ?Platelets 95>>98K today  ?6. DM2: new diagnosis. Hgba1c 7.0 ?- continue SGLT2i ?- cover with SSI ?- Likely would benefit from [facility] ? ?Anticipate d/c home today. MD to clear. Will arrange f/u in the [facility].  ? ?Length of Stay: 6 ? ?Lyda Jester PA-C  ?10/19/2021, 7:32 AM ? ?Advanced Heart Failure Team ?Pager [phone] (M-F; 7a - 4p)  ?Please contact [facility] for night-coverage after hours (4p -7a ) and weekends on [email or web address]  ? ?Patient seen with PA, agree with the above note.  ? ?PCI yesterday with DES x 2 to LAD.   ? ?No complaints this morning, BP and creatinine stable.  Walking in hall.  ? ?General: NAD ?Neck: No JVD, no thyromegaly or thyroid nodule.  ?Lungs: Mildly distant BS.  ?CV: Nondisplaced PMI.  Heart regular S1/S2, no S3/S4, no murmur.  No peripheral edema.   ?Abdomen: Soft, nontender, no hepatosplenomegaly, no distention.   ?Skin: Intact without lesions or rashes.  ?Neurologic: Alert and oriented x 3.  ?Psych: Normal affect. ?Extremities: No clubbing or cyanosis.  ?HEENT: Normal.  ? ?Today, will increase bisoprolol to 5 mg d

## 2021-10-19 NOTE — TOC Initial Note (Addendum)
Transition of Care (TOC) - Initial/Assessment Note  ? ? ?Patient Details  ?Name: Paul Bullock ?MRN: [id number] ?Date of Birth: November 27, 1945 ? ?Transition of Care [facility]) CM/SW Contact:    ?Marcheta Grammes Rexene Alberts, RN ?Phone Number: [phone] ?10/19/2021, 11:48 AM ? ?Clinical Narrative:                 ? ?HF TOC CM spoke to pt and states he is independent at home. Contacted pharmacy and all meds are in stock. Called in 30 day free trial cards for [place]. His copay for both is $38.00. His trial cards went through and $0 copay for [place]. Updated patient with information. Pt states he uses [facility] for his refill and can get a 90 day supply for $38. He has tried Crestor in past and had muscle weakness. Educated pt to notify and discuss with provider or pharmacist any side effects of medications. Verbalized understanding.   ? ? ? ?Expected Discharge Plan: Home/Self Care ?Barriers to Discharge: No Barriers Identified ? ? ?Patient Goals and CMS Choice ?Patient states their goals for this hospitalization and ongoing recovery are:: remain independent ?CMS Medicare.gov Compare Post Acute Care list provided to:: Patient ?  ? ?Expected Discharge Plan and Services ?Expected Discharge Plan: Home/Self Care ?  ?Discharge Planning Services: CM Consult ?  ?Living arrangements for the past 2 months: [place] ?Expected Discharge Date: 10/19/21               ?  ?  ?  ?  ?  ?  ?  ?  ?  ?  ? ?Prior Living Arrangements/Services ?Living arrangements for the past 2 months: [place] ?Lives with:: Self ?Patient language and need for interpreter reviewed:: Yes ?Do you feel safe going back to the place where you live?: Yes      ?Need for Family Participation in Patient Care: No (Comment) ?Care giver support system in place?: No (comment) ?  ?Criminal Activity/Legal Involvement Pertinent to Current Situation/Hospitalization: No - Comment as needed ? ?Activities of Daily Living ?Home Assistive  Devices/Equipment: Eyeglasses, Grab bars in shower, Nebulizer ?ADL Screening (condition at time of admission) ?Patient's cognitive ability adequate to safely complete daily activities?: Yes ?Is the patient deaf or have difficulty hearing?: No ?Does the patient have difficulty seeing, even when wearing glasses/contacts?: No ?Does the patient have difficulty concentrating, remembering, or making decisions?: No ?Patient able to express need for assistance with ADLs?: Yes ?Does the patient have difficulty dressing or bathing?: No ?Independently performs ADLs?: Yes (appropriate for developmental age) ?Does the patient have difficulty walking or climbing stairs?: No ?Weakness of Legs: None ?Weakness of Arms/Hands: None ? ?Permission Sought/Granted ?Permission sought to share information with : Case Manager, Family Supports, PCP ?Permission granted to share information with : Yes, Verbal Permission Granted ?   ?   ?   ?   ? ?Emotional Assessment ?Appearance:: Appears stated age ?Attitude/Demeanor/Rapport: Gracious ?Affect (typically observed): Accepting ?Orientation: : Oriented to Self, Oriented to Place, Oriented to  Time, Oriented to Situation ?  ?Psych Involvement: No (comment) ? ?Admission diagnosis:  Acute on chronic HFrEF (heart failure with reduced ejection fraction) ([place]) [I50.23] ?Patient Active Problem List  ? Diagnosis Date Noted  ? Tobacco dependence 10/13/2021  ? Thrombocytopenia ([place]) 10/13/2021  ? Polycythemia 10/13/2021  ? Hyperglycemia 10/13/2021  ? Essential hypertension 10/13/2021  ? Hyperlipidemia 10/13/2021  ? Shortness of breath 10/13/2021  ? Acute on chronic HFrEF (heart failure with  reduced ejection fraction) ([place]) 10/13/2021  ? Acute systolic CHF (congestive heart failure) ([place])   ? COPD with acute exacerbation ([place]) 10/12/2021  ? Non-ST elevation (NSTEMI) myocardial infarction ([place]) 10/12/2021  ? CAD (coronary artery disease) 10/12/2021  ? History of pulmonary embolus (PE) 10/12/2021  ? Rectal  bleeding 10/12/2021  ? ?PCP:  Pcp, No ?Pharmacy:   ?[facility] [address] - [place], [place] [address] AT [address] ?[address] [place] [address] ?Phone: [phone] Fax: [phone] ? ? ? ? ?Social Determinants of Health (SDOH) Interventions ?  ? ?Readmission Risk Interventions ?No flowsheet data found. ? ? ?

## 2021-10-19 NOTE — Progress Notes (Signed)
Mobility Specialist Progress Note  ? ? 10/19/21 1034  ?Mobility  ?Activity Ambulated independently in hallway  ?Level of Assistance Independent  ?Assistive Device None  ?Distance Ambulated (ft) 420 ft  ?Activity Response Tolerated well  ?$Mobility charge 1 Mobility  ? ?Pt received sitting EOB and agreeable. No complaints. Returned to sitting EOB with family present.  ? ?Paul Bullock ?Mobility Specialist  ?M.S. 5N: [phone]  ?

## 2021-10-19 NOTE — Plan of Care (Signed)

## 2021-10-19 NOTE — Discharge Summary (Signed)
?Advanced Heart Failure Team ? ?Discharge Summary  ? ?Patient ID: Paul Bullock ?MRN: [id number], DOB/AGE: [birth year] 74 y.o. Admit date: 10/13/2021 ?D/C date:     10/19/2021  ? ?Primary Discharge Diagnoses:  ?1.  CAD: NSTEMI, peak troponin 5795 ?2. 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 ?-3. H/o DVT/PE ?4. COPD ?5. Thrombocytopenia ?6. DM2: new diagnosis.  ? ?Hospital Course:   ?Paul Bullock is a 76 y.o. male with a history of CAD, chronic heart failure with improved ejection fraction, ischemic cardiomyopathy status post AICD, hypertension, hyperlipidemia, obesity, DVT and PE on chronic Eliquis, thrombocytopenia, COPD, and ongoing tobacco abuse, who presents on transfer from [facility] 2/2 NSTEMI and low-ouput CHF. HF optimized and GDMT started. Diuresed with IV lasix and transitioned to torsemide 20 mg daily. Underwent cath with complex PCI to prox-mid LAD 3/15 by Dr. Fletcher Anon. Placed on anticoagulation due to previous DVT/PE, on plavix rather than Brilinta. Per Dr. Fletcher Anon, ok to stop ASA today and continue Eliquis. He will continue crestor 40 mg daily.  ? ?See below for detailed problems list. He will contine to be followed closely in the HF clinic.  ? ?1.  CAD: NSTEMI, peak troponin 5795.  Patient has history of CAD with prior PCIs.  Coronary angiography today showed complex proximal LAD disease with serial 90% stenoses separated by small aneurysmal areas.  He had a 60% mid LCx stenosis.  RCA with mild disease.  With wheezing and marked volume overload, it was decided to try to optimized his CHF and COPD prior to complex LAD intervention.  He is not thought to be a CABG candidate with severe LV dysfunction, low PAPI, and COPD. .  ?- S/p complex PCI to prox-mid LAD 3/15 by Dr. Fletcher Anon.  ?- He will need to be on anticoagulation due to previous DVT/PE, on plavix rather than Brilinta. Per Dr. Fletcher Anon, ok to stop ASA today and continue Eliquis.  ?- Continue Crestor 40 mg daily.  ?2. Acute on chronic systolic CHF:  Ischemic cardiomyopathy.  Echo with EF 20-25%, severe LV dilation, RV mildly dysfunctional.  RHC with markedly elevated filling pressures, CI 2.03, and PAPI < 1.   ?- diuresed with IV lasix and transitioned to torsemide 20 mg daily.  ?- Continue bisoprolol 2.5 mg daily (beta-1 selective with COPD).  ?- Continue Entresto 24/26 bid.   ?- Continue dapagliflozin 10 mg daily.  ?- Continue spironolactone 25 mg daily.  ?-3. 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   ?- Apixaban resumed post  PCI.  ?- Will need Plavix for new DES. No ASA to minimize risk of bleeding  ?4. COPD: Audible wheezing has resolved.  ?- Needs to quit smoking.  ?- Off steroids, continue inhalers.  ?5. Thrombocytopenia: Mild, chronic.  ?Platelets 95>>98K  ?6. DM2: new diagnosis. Hgba1c 7.0 ?- continue SGLT2i ?- cover with SSI ?- Likely would benefit from [facility] ?- He will need f/u with PCP. ? ?Discharge Weight : 248.9 pounds.  ?Discharge Vitals: Blood pressure 134/78, pulse 79, temperature 98.6 ?F (37 ?C), temperature source Oral, resp. rate (!) 27, height '5\' 10"'$  (1.778 m), weight 112.9 kg, SpO2 96 %. ? ?Labs: ?Lab Results  ?Component Value Date  ? WBC 8.6 10/19/2021  ? HGB 17.7 (H) 10/19/2021  ? HCT 51.6 10/19/2021  ? MCV 94.0 10/19/2021  ? PLT 98 (L) 10/19/2021  ?  ?Recent Labs  ?Lab 10/12/21 ?1707 10/13/21 ?6010 10/19/21 ?0415  ?NA 139   < > 136  ?K 3.5   < > 4.6  ?CL  101   < > 97*  ?CO2 30   < > 31  ?BUN 15   < > 21  ?CREATININE 1.09   < > 1.02  ?CALCIUM 9.3   < > 8.9  ?PROT 7.2  --   --   ?BILITOT 2.7*  --   --   ?ALKPHOS 76  --   --   ?ALT 23  --   --   ?AST 59*  --   --   ?GLUCOSE 190*   < > 119*  ? < > = values in this interval not displayed.  ? ?Lab Results  ?Component Value Date  ? CHOL 128 10/14/2021  ? HDL 48 10/14/2021  ? [place] 59 10/14/2021  ? TRIG 107 10/14/2021  ? ?BNP (last 3 results) ?Recent Labs  ?  10/12/21 ?1707  ?BNP 431.7*  ? ? ?ProBNP (last 3 results) ?No results for input(s): PROBNP in the last 8760 hours. ? ? ?Diagnostic  Studies/Procedures  ? ?CARDIAC CATHETERIZATION ? ?Result Date: 10/18/2021 ?  Ost LM to Mid LM lesion is 30% stenosed.   Prox Cx to Mid Cx lesion is 20% stenosed.   LPAV lesion is 60% stenosed.   Dist LAD lesion is 65% stenosed.   Mid LAD lesion is 90% stenosed.   Prox LAD to Mid LAD lesion is 90% stenosed.   A drug-eluting stent was successfully placed using a STENT ONYX FRONTIER 2.5X34.   A drug-eluting stent was successfully placed using a STENT ONYX FRONTIER 3.0X22.   Post intervention, there is a 0% residual stenosis.   Post intervention, there is a 0% residual stenosis. Successful high risk /difficult PCI and 2 overlapped drug-eluting stent placement to the mid and proximal LAD. Difficult procedure due to the presence of 2 large aneurysmal areas as well as heavy calcifications proximally.  There was borderline disease in the distal LAD that was left to be treated medically as the vessel size is small in that area. Recommendations: Continue dual antiplatelet therapy with aspirin and clopidogrel for now. Eliquis can be resumed tomorrow and once Eliquis is resumed, aspirin can be discontinued to minimize risk of bleeding. Continue treatment of heart failure.   ? ?Discharge Medications  ? ?Allergies as of 10/19/2021   ?No Known Allergies ?  ? ?  ?Medication List  ?  ? ?STOP taking these medications   ? ?aspirin 81 MG chewable tablet ?  ?carvedilol 12.5 MG tablet ?Commonly known as: COREG ?  ?furosemide 10 MG/ML injection ?Commonly known as: LASIX ?  ?furosemide 20 MG tablet ?Commonly known as: LASIX ?  ?heparin 25000 UT/250ML infusion ?  ?lisinopril 10 MG tablet ?Commonly known as: ZESTRIL ?  ?Livalo 4 MG Tabs ?Generic drug: Pitavastatin Calcium ?  ?methylPREDNISolone sodium succinate 40 mg/mL injection ?Commonly known as: SOLU-MEDROL ?  ?morphine (PF) 2 MG/ML injection ?  ?pravastatin 80 MG tablet ?Commonly known as: PRAVACHOL ?  ? ?  ? ?TAKE these medications   ? ?albuterol (2.5 MG/3ML) 0.083% nebulizer  solution ?Commonly known as: PROVENTIL ?Take 2.5 mg by nebulization every 6 (six) hours as needed for wheezing or shortness of breath. ?  ?apixaban 5 MG Tabs tablet ?Commonly known as: ELIQUIS ?Take 5 mg by mouth 2 (two) times daily. ?  ?bisoprolol 5 MG tablet ?Commonly known as: ZEBETA ?Take 1 tablet (5 mg total) by mouth daily. ?  ?clopidogrel 75 MG tablet ?Commonly known as: PLAVIX ?Take 1 tablet (75 mg total) by mouth daily. ?  ?Cyanocobalamin 2500  MCG Tabs ?Take 2,500 mcg by mouth daily. ?  ?empagliflozin 10 MG Tabs tablet ?Commonly known as: JARDIANCE ?Take 1 tablet (10 mg total) by mouth daily. ?  ?fluticasone 50 MCG/ACT nasal spray ?Commonly known as: FLONASE ?Place 2 sprays into both nostrils daily. ?  ?fluticasone furoate-vilanterol 100-25 MCG/ACT Aepb ?Commonly known as: BREO ELLIPTA ?Inhale 1 puff into the lungs in the morning. ?  ?ipratropium-albuterol 0.5-2.5 (3) MG/3ML Soln ?Commonly known as: DUONEB ?Take 3 mLs by nebulization 3 (three) times daily. ?  ?multivitamin with minerals tablet ?Take 1 tablet by mouth daily. ?  ?nitroGLYCERIN 0.4 MG SL tablet ?Commonly known as: NITROSTAT ?Place 1 tablet (0.4 mg total) under the tongue every 5 (five) minutes as needed for chest pain. ?  ?polyethylene glycol 17 g packet ?Commonly known as: MIRALAX / GLYCOLAX ?Take 17 g by mouth daily. ?  ?rosuvastatin 40 MG tablet ?Commonly known as: CRESTOR ?Take 1 tablet (40 mg total) by mouth daily. ?  ?sacubitril-valsartan 24-26 MG ?Commonly known as: ENTRESTO ?Take 1 tablet by mouth 2 (two) times daily. ?  ?senna-docusate 8.6-50 MG tablet ?Commonly known as: Senokot-S ?Take 1 tablet by mouth daily. ?  ?spironolactone 25 MG tablet ?Commonly known as: ALDACTONE ?Take 1 tablet (25 mg total) by mouth daily. ?  ?torsemide 20 MG tablet ?Commonly known as: DEMADEX ?Take 1 tablet (20 mg total) by mouth daily. ?  ?Vitamin D-3 125 MCG (5000 UT) Tabs ?Take 5,000 Units by mouth daily. ?  ? ?  ? ? ?Disposition  ? ?The patient will be  discharged in stable condition to home. ?Discharge Instructions   ? ? (HEART FAILURE PATIENTS) Call MD:  Anytime you have any of the following symptoms: 1) 3 pound weight gain in 24 hours or 5 pounds in 1 week 2) s

## 2021-10-21 ENCOUNTER — Telehealth: Payer: Self-pay

## 2021-10-21 NOTE — Telephone Encounter (Signed)
Paul Bullock was discharged from the hospital recently and wants to use nicotine patch to quit smoking. Dr Aundra Dubin is on this weekend and message sent. Dr Aundra Dubin stated it was ok to start the nicotine patches. The patient was called back and is pleased.  ?

## 2021-10-27 ENCOUNTER — Ambulatory Visit (HOSPITAL_COMMUNITY)
Admit: 2021-10-27 | Discharge: 2021-10-27 | Disposition: A | Discharge status: Home / Self Care | Payer: Medicare Other | Attending: Cardiology | Admitting: Cardiology

## 2021-10-27 ENCOUNTER — Encounter (HOSPITAL_COMMUNITY): Payer: Self-pay | Admitting: Cardiology

## 2021-10-27 ENCOUNTER — Telehealth (HOSPITAL_COMMUNITY): Payer: Self-pay

## 2021-10-27 ENCOUNTER — Other Ambulatory Visit: Payer: Self-pay

## 2021-10-27 VITALS — BP 100/60 | HR 84 | Ht 70.0 in | Wt 244.6 lb

## 2021-10-27 DIAGNOSIS — Z86711 Personal history of pulmonary embolism: Secondary | ICD-10-CM | POA: Insufficient documentation

## 2021-10-27 DIAGNOSIS — I5023 Acute on chronic systolic (congestive) heart failure: Secondary | ICD-10-CM

## 2021-10-27 DIAGNOSIS — Z7901 Long term (current) use of anticoagulants: Secondary | ICD-10-CM | POA: Diagnosis not present

## 2021-10-27 DIAGNOSIS — D696 Thrombocytopenia, unspecified: Secondary | ICD-10-CM | POA: Insufficient documentation

## 2021-10-27 DIAGNOSIS — M791 Myalgia, unspecified site: Secondary | ICD-10-CM | POA: Insufficient documentation

## 2021-10-27 DIAGNOSIS — I255 Ischemic cardiomyopathy: Secondary | ICD-10-CM | POA: Diagnosis not present

## 2021-10-27 DIAGNOSIS — Z86718 Personal history of other venous thrombosis and embolism: Secondary | ICD-10-CM | POA: Diagnosis not present

## 2021-10-27 DIAGNOSIS — Z79899 Other long term (current) drug therapy: Secondary | ICD-10-CM | POA: Insufficient documentation

## 2021-10-27 DIAGNOSIS — C679 Malignant neoplasm of bladder, unspecified: Secondary | ICD-10-CM | POA: Diagnosis not present

## 2021-10-27 DIAGNOSIS — I82409 Acute embolism and thrombosis of unspecified deep veins of unspecified lower extremity: Secondary | ICD-10-CM | POA: Diagnosis not present

## 2021-10-27 DIAGNOSIS — I252 Old myocardial infarction: Secondary | ICD-10-CM | POA: Insufficient documentation

## 2021-10-27 DIAGNOSIS — J449 Chronic obstructive pulmonary disease, unspecified: Secondary | ICD-10-CM | POA: Diagnosis not present

## 2021-10-27 DIAGNOSIS — I2510 Atherosclerotic heart disease of native coronary artery without angina pectoris: Secondary | ICD-10-CM | POA: Insufficient documentation

## 2021-10-27 DIAGNOSIS — Z7984 Long term (current) use of oral hypoglycemic drugs: Secondary | ICD-10-CM | POA: Insufficient documentation

## 2021-10-27 DIAGNOSIS — I5022 Chronic systolic (congestive) heart failure: Secondary | ICD-10-CM

## 2021-10-27 DIAGNOSIS — Z955 Presence of coronary angioplasty implant and graft: Secondary | ICD-10-CM | POA: Insufficient documentation

## 2021-10-27 LAB — CBC
HCT: 55.9 % — ABNORMAL HIGH (ref 39.0–52.0)
Hemoglobin: 19.5 g/dL — ABNORMAL HIGH (ref 13.0–17.0)
MCH: 32.3 pg (ref 26.0–34.0)
MCHC: 34.9 g/dL (ref 30.0–36.0)
MCV: 92.5 fL (ref 80.0–100.0)
Platelets: 165 10*3/uL (ref 150–400)
RBC: 6.04 MIL/uL — ABNORMAL HIGH (ref 4.22–5.81)
RDW: 12 % (ref 11.5–15.5)
WBC: 10.5 10*3/uL (ref 4.0–10.5)
nRBC: 0 % (ref 0.0–0.2)

## 2021-10-27 LAB — BASIC METABOLIC PANEL
Anion gap: 10 (ref 5–15)
BUN: 14 mg/dL (ref 8–23)
CO2: 25 mmol/L (ref 22–32)
Calcium: 9.6 mg/dL (ref 8.9–10.3)
Chloride: 102 mmol/L (ref 98–111)
Creatinine, Ser: 1.51 mg/dL — ABNORMAL HIGH (ref 0.61–1.24)
GFR, Estimated: 48 mL/min — ABNORMAL LOW (ref >60)
Glucose, Bld: 128 mg/dL — ABNORMAL HIGH (ref 70–99)
Potassium: 5 mmol/L (ref 3.5–5.1)
Sodium: 137 mmol/L (ref 135–145)

## 2021-10-27 LAB — MAGNESIUM: Magnesium: 2.5 mg/dL — ABNORMAL HIGH (ref 1.7–2.4)

## 2021-10-27 MED ORDER — NITROGLYCERIN 0.4 MG SL SUBL: 0.4000 mg | SUBLINGUAL_TABLET | SUBLINGUAL | 3 refills | Status: DC | PRN

## 2021-10-27 MED ORDER — LIVALO 4 MG PO TABS
4.0000 mg | ORAL_TABLET | Freq: Every day | ORAL | 3 refills | Status: DC
Start: 2021-10-27 — End: 2023-01-21

## 2021-10-27 MED ORDER — NYSTATIN 100000 UNIT/ML MT SUSP: 2.0000 mL | Freq: Four times a day (QID) | OROMUCOSAL | 0 refills | Status: DC

## 2021-10-27 NOTE — Telephone Encounter (Signed)
Referral faxed to [facility] °

## 2021-10-27 NOTE — Progress Notes (Signed)
Referral faxed to [facility] on 10/27/2021 @ 16.20pm  ?

## 2021-10-27 NOTE — Patient Instructions (Signed)
Medication Changes: ? ?Stop Crestor ? ?Restart Livalo 4 mg daily ? ?Lab Work: ? ?Labs done today, your results will be available in MyChart, we will contact you for abnormal readings. ? ? ?Testing/Procedures: ? ?none ? ?Referrals: ? ?You have been referred to the device clinic to help monitor your device. They will call you to arrange your appointment. ? ?You have been referred to [facility]. They will call you to arrange your appointment. ? ?Special Instructions // Education: ? ?Please follow up with our heart failure pharmacist in 3 weeks ? ? ?Follow-Up in: 4 months (July 2023)  **please call the office in mid April to arrange your follow up ** ? ?At the [facility], you and your health needs are our priority. We have a designated team specialized in the treatment of Heart Failure. This Care Team includes your primary Heart Failure Specialized Cardiologist (physician), Advanced Practice Providers (APPs- Physician Assistants and Nurse Practitioners), and Pharmacist who all work together to provide you with the care you need, when you need it.  ? ?You may see any of the following providers on your designated Care Team at your next follow up: ? ?Dr Glori Bickers ?Dr Loralie Champagne ?Darrick Grinder, NP ?Lyda Jester, PA ?Jessica Milford,NP ?Marlyce Huge, PA ?Audry Riles, PharmD ? ? ?Please be sure to bring in all your medications bottles to every appointment.  ? ?Need to Contact us: ? ?If you have any questions or concerns before your next appointment please send [place] a message through [place] or call our office at [phone].   ? ?TO LEAVE A MESSAGE FOR THE NURSE SELECT OPTION 2, PLEASE LEAVE A MESSAGE INCLUDING: ?YOUR NAME ?DATE OF BIRTH ?CALL BACK NUMBER ?REASON FOR CALL**this is important as we prioritize the call backs ? ?YOU WILL RECEIVE A CALL BACK THE SAME DAY AS LONG AS YOU CALL BEFORE 4:00 PM ? ? ?

## 2021-10-29 NOTE — Progress Notes (Signed)
PCP: Pcp, No ?Cardiology: Dr. Aundra Dubin ? ?76 y.o. with history of COPD, DVT/PE, ischemic cardiomyopathy, and CAD presents for followup of CHF after recent hospitalization.  Patient suffered a myocardial infarction complicated by cardiac arrest in 2002 and required stenting of the obtuse marginal.  He notes that he subsequently had LV dysfunction.  In 2013, he required repeat diagnostic catheterization which revealed moderate disease distal to previously placed obtuse marginal stent with a 75% stenosis in the mid LAD with aneurysmal post stenotic dilation.  Per notes, he was medically managed.  In the setting of LV dysfunction with an EF as low as 20%, he underwent Medtronic AICD placement in April 2016, and up until 2022, he was followed closely by cardiology in [place], [place].  He did require admission to a hospital in [facility] in June 2002, in the setting of COPD exacerbation while visiting his son.  Per discharge summary, he was noted to have mild troponin elevation which was felt to be secondary to demand ischemia. ?  ?Mr. Mcmeekin moved to [place] in December 2022 and has been living locally.  His ex-wife lives nearby and they are close friends.  On October 12 2021, he developed recurrent chest discomfort and was admitted to [facility] with NSTEMI.  He underwent diagnostic catheterization at [facility] revealing severe proximal to mid LAD disease with otherwise nonobstructive disease and patent obtuse marginal stent.  His LVEDP was approximately 40 mmHg and ventriculogram showed an EF of less than 25%.  He was given Lasix in the lab and after discussion with advanced heart failure team at [facility], he was transferred for further evaluation and potential inotropic support.  At [facility], he was diuresed and started on GDMT.  He had DES x 2 to proximal and mid LAD and was discharged home.  ? ?He has been doing well at home.  Using nicotine patches and not smoking.  No dyspnea walking up a flight  of stairs.  He will get fatigued/shortness of breath after walking for about 1.5 hours.  No lightheadedness.  No chest pain.  No orthopnea/PND.  He says that his breathing is now "fantastic."  Has not had to use his nebulizer.  His muscles are achy on Crestor, similar with atorvastatin but was able to tolerate Livalo in the past without problems.  ? ?ECG (personally reviewed): NSR, IVCD 128 msec ? ?Labs (3/23): K 4.6, creatinine 1.02, LDL 59, hgb 17.7, plts 98K ? ?PMH: ?1. COPD: Quit smoking in 3/23.  ?2. DVT/PE ?3. Chronic systolic CHF: Ischemic cardiomyopathy.  Medtronic ICD from 4/16.  ?- Echo (3/23): EF 20-25% ?- RHC (3/23): mean RA 25, PA 62/47, mean PCWP 39, CI 2.03.  ?4. CAD: MI with OM2 stent in 2002.  ?- NSTEMI 3/23 with DES x 2 to proximal and mid LAD.  ?5. Chronic mild thrombocytopenia.  ?6. Bladder cancer 2012: Chemoradiation.  ?7. Type 2 DM ? 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  Quit smoking in 3/23.  No ETOH.  ? ?Family History  ?Problem Relation Age of Onset  ? Stroke Mother   ? Bladder Cancer Father   ? Heart attack Brother   ? ?ROS: All systems reviewed and negative except as per HPI.  ? ?Current Outpatient Medications  ?Medication Sig Dispense Refill  ? albuterol (PROVENTIL) (2.5 MG/3ML) 0.083% nebulizer solution Take 2.5 mg by nebulization every 6 (six) hours as needed for wheezing or shortness of breath.    ? apixaban (ELIQUIS) 5 MG  TABS tablet Take 5 mg by mouth 2 (two) times daily.    ? bisoprolol (ZEBETA) 5 MG tablet Take 1 tablet (5 mg total) by mouth daily. 30 tablet 6  ? Cholecalciferol (VITAMIN D-3) 125 MCG (5000 UT) TABS Take 5,000 Units by mouth daily.    ? clopidogrel (PLAVIX) 75 MG tablet Take 1 tablet (75 mg total) by mouth daily. 30 tablet 6  ? Cyanocobalamin 2500 MCG TABS Take 2,500 mcg by mouth daily.    ? empagliflozin (JARDIANCE) 10 MG TABS tablet Take 1 tablet (10 mg total) by mouth daily. 30 tablet 6  ? fluticasone (FLONASE) 50 MCG/ACT nasal spray Place 2  sprays into both nostrils daily.    ? fluticasone furoate-vilanterol (BREO ELLIPTA) 100-25 MCG/ACT AEPB Inhale 1 puff into the lungs in the morning.    ? Multiple Vitamins-Minerals (MULTIVITAMIN WITH MINERALS) tablet Take 1 tablet by mouth daily.    ? nystatin (MYCOSTATIN) 100000 UNIT/ML suspension Take 2 mLs (200,000 Units total) by mouth 4 (four) times daily. 60 mL 0  ? Pitavastatin Calcium (LIVALO) 4 MG TABS Take 1 tablet (4 mg total) by mouth daily. 90 tablet 3  ? sacubitril-valsartan (ENTRESTO) 24-26 MG Take 1 tablet by mouth 2 (two) times daily. 60 tablet 6  ? spironolactone (ALDACTONE) 25 MG tablet Take 1 tablet (25 mg total) by mouth daily. 30 tablet 6  ? torsemide (DEMADEX) 20 MG tablet Take 1 tablet (20 mg total) by mouth daily. 30 tablet 6  ? nitroGLYCERIN (NITROSTAT) 0.4 MG SL tablet Place 1 tablet (0.4 mg total) under the tongue every 5 (five) minutes as needed for chest pain. 75 tablet 3  ? ?No current facility-administered medications for this encounter.  ? ?BP 100/60   Pulse 84   Ht '5\' 10"'$  (1.778 m)   Wt 110.9 kg (244 lb 9.6 oz)   SpO2 94%   BMI 35.10 kg/m?  ?General: NAD ?Neck: No JVD, no thyromegaly or thyroid nodule.  ?Lungs: Distant BS.  ?CV: Nondisplaced PMI.  Heart regular S1/S2, no S3/S4, no murmur. Trace ankle edema.  No carotid bruit.  Normal pedal pulses.  ?Abdomen: Soft, nontender, no hepatosplenomegaly, no distention.  ?Skin: Intact without lesions or rashes.  ?Neurologic: Alert and oriented x 3.  ?Psych: Normal affect. ?Extremities: No clubbing or cyanosis.  ?HEENT: Normal.  ? ?Assessment/Plan: ?1.  CAD: Patient has history of CAD with prior PCI to OM in 2002.  NSTEMI in 3/23 with cath showing complex proximal LAD disease with serial 90% stenoses separated by small aneurysmal areas.  He had a 60% mid LCx stenosis.  RCA with mild disease.  He had DES x 2 to proximal and mid LAD. No chest pain.  ?- He will need to be on anticoagulation due to previous DVT/PE (apixaban), on plavix for  at least 6 months, ideally 1 year. Not on ASA.  ?- Myalgias with Crestor and atorvastatin, restart Livalo 4 mg daily.  Lipids/LFTs in 2 months.  ?2. Chronic systolic CHF: Ischemic cardiomyopathy.  Echo in 3/23 with EF 20-25%, severe LV dilation, RV mildly dysfunctional.  RHC in 3/23 with markedly elevated filling pressures, CI 2.03, and PAPI < 1. He is not volume overloaded on exam. NYHA class II.  ?- Continue torsemide 20 mg daily, can decrease if creatinine higher. BMET today.  ?- Continue bisoprolol 5 mg daily (beta-1 selective with COPD).  ?- Continue Entresto 24/26 bid.  No BP room to increase today.  ?- Continue empagliflozin 10 mg daily.  ?-  Continue spironolactone 25 mg daily.  ?- Repeat echo in 3 months, he will need ICD if EF low.  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   ?- Continue apixaban.  ?4. COPD: He has quit smoking.  ?5. Thrombocytopenia: Mild, chronic.  ?6. Bladder cancer: Has been followed by cystoscopies   ?- He needs to establish with a urologist, will refer to Dr. Alinda Money.  ? ?Followup 3 wks with HF pharmacist for med titration, followup 6 wks with APP.  ? ?Loralie Champagne ?10/30/2021 ? ?

## 2021-10-30 ENCOUNTER — Telehealth: Payer: Self-pay

## 2021-10-30 ENCOUNTER — Other Ambulatory Visit (HOSPITAL_COMMUNITY): Payer: Self-pay | Admitting: *Deleted

## 2021-10-30 NOTE — Telephone Encounter (Signed)
Received message from HF clinic patient needs to get established with EP physician.  Spoke with patient and provided Pt moved from [place] in December 2022 and had appointment with Dr Aundra Dubin on 3/24.  He has a Patent examiner.  Advised will have EP to call him to establish appointment with EP physician.  Message sent to Gracy Bruins, EP scheduler for follow up.   ?

## 2021-10-31 ENCOUNTER — Telehealth (HOSPITAL_COMMUNITY): Payer: Self-pay | Admitting: *Deleted

## 2021-10-31 NOTE — Telephone Encounter (Signed)
Pt left vm with pharmD needing a return call about meds. I called to get more information no answer/left msg for return call. I called pt again at [phone] and was told I had the wrong number.  ?

## 2021-11-01 ENCOUNTER — Telehealth (HOSPITAL_COMMUNITY): Payer: Self-pay | Admitting: *Deleted

## 2021-11-01 ENCOUNTER — Other Ambulatory Visit (HOSPITAL_COMMUNITY): Payer: Self-pay | Admitting: *Deleted

## 2021-11-01 DIAGNOSIS — I5022 Chronic systolic (congestive) heart failure: Secondary | ICD-10-CM

## 2021-11-01 MED ORDER — NYSTATIN 100000 UNIT/ML MT SUSP
2.0000 mL | Freq: Four times a day (QID) | OROMUCOSAL | 0 refills | Status: DC
Start: 2021-11-01 — End: 2022-09-14

## 2021-11-01 MED ORDER — TORSEMIDE 20 MG PO TABS
20.0000 mg | ORAL_TABLET | Freq: Every day | ORAL | 3 refills | Status: DC
Start: 2021-11-01 — End: 2021-11-01

## 2021-11-01 MED ORDER — SACUBITRIL-VALSARTAN 24-26 MG PO TABS: 1.0000 | ORAL_TABLET | Freq: Two times a day (BID) | ORAL | 3 refills | Status: DC

## 2021-11-01 MED ORDER — BISOPROLOL FUMARATE 5 MG PO TABS: 5.0000 mg | ORAL_TABLET | Freq: Every day | ORAL | 3 refills | Status: DC

## 2021-11-01 MED ORDER — SPIRONOLACTONE 25 MG PO TABS: 25.0000 mg | ORAL_TABLET | Freq: Every day | ORAL | 3 refills | Status: DC

## 2021-11-01 MED ORDER — NITROGLYCERIN 0.4 MG SL SUBL: 0.4000 mg | SUBLINGUAL_TABLET | SUBLINGUAL | 3 refills | Status: DC | PRN

## 2021-11-01 MED ORDER — TORSEMIDE 20 MG PO TABS: 10.0000 mg | ORAL_TABLET | Freq: Every day | ORAL | 3 refills | Status: DC

## 2021-11-01 MED ORDER — CLOPIDOGREL BISULFATE 75 MG PO TABS: 75.0000 mg | ORAL_TABLET | Freq: Every day | ORAL | 3 refills | Status: DC

## 2021-11-01 MED ORDER — EMPAGLIFLOZIN 10 MG PO TABS: 10.0000 mg | ORAL_TABLET | Freq: Every day | ORAL | 3 refills | Status: DC

## 2021-11-01 NOTE — Telephone Encounter (Signed)
Scarlette Calico, RN  ?11/01/2021  5:04 PM EDT Back to Top  ?  ?Pt aware, agreeable, and verbalized understanding, will repeat labs 4/11 at Dr Olin Pia office  ? ?

## 2021-11-01 NOTE — Telephone Encounter (Signed)
-----   Message from Larey Dresser, MD sent at 10/29/2021 10:03 AM EDT ----- ?Hold torsemide for 2 days, decrease to 10 mg daily after that.  BMET 10 days.  ?

## 2021-11-07 LAB — HM DIABETES EYE EXAM

## 2021-11-09 ENCOUNTER — Ambulatory Visit: Payer: Medicare Other | Admitting: Physician Assistant

## 2021-11-14 ENCOUNTER — Ambulatory Visit (INDEPENDENT_AMBULATORY_CARE_PROVIDER_SITE_OTHER): Payer: Medicare Other | Admitting: Internal Medicine

## 2021-11-14 ENCOUNTER — Encounter: Payer: Self-pay | Admitting: Internal Medicine

## 2021-11-14 VITALS — BP 90/54 | HR 82 | Ht 70.0 in | Wt 254.0 lb

## 2021-11-14 DIAGNOSIS — I5022 Chronic systolic (congestive) heart failure: Secondary | ICD-10-CM | POA: Diagnosis not present

## 2021-11-14 DIAGNOSIS — Z95810 Presence of automatic (implantable) cardiac defibrillator: Secondary | ICD-10-CM | POA: Diagnosis not present

## 2021-11-14 DIAGNOSIS — I255 Ischemic cardiomyopathy: Secondary | ICD-10-CM | POA: Diagnosis not present

## 2021-11-14 DIAGNOSIS — D751 Secondary polycythemia: Secondary | ICD-10-CM | POA: Diagnosis not present

## 2021-11-14 MED ORDER — APIXABAN 2.5 MG PO TABS: 2.5000 mg | ORAL_TABLET | Freq: Two times a day (BID) | ORAL | 3 refills | Status: DC

## 2021-11-14 NOTE — Patient Instructions (Signed)
Medication Instructions:  ?- Your physician has recommended you make the following change in your medication:  ? ?1) DECREASE Eliquis to 2.5 mg: ?- take 1 tablet (2.5 mg) by mouth TWICE daily  ? ?*If you need a refill on your cardiac medications before your next appointment, please call your pharmacy* ? ? ?Lab Work: ?- Your physician recommends that you have lab work today: BMP/ JAK-2/ Erythropoietin ? ?If you have labs (blood work) drawn today and your tests are completely normal, you will receive your results only by: ?MyChart Message (if you have MyChart) OR ?A paper copy in the mail ?If you have any lab test that is abnormal or we need to change your treatment, we will call you to review the results. ? ? ?Testing/Procedures: ?- none ordered ? ? ?Follow-Up: ?At [facility], you and your health needs are our priority.  As part of our continuing mission to provide you with exceptional heart care, we have created designated Provider Care Teams.  These Care Teams include your primary Cardiologist (physician) and Advanced Practice Providers (APPs -  Physician Assistants and Nurse Practitioners) who all work together to provide you with the care you need, when you need it. ? ?We recommend signing up for the patient portal called "MyChart".  Sign up information is provided on this After Visit Summary.  MyChart is used to connect with patients for Virtual Visits (Telemedicine).  Patients are able to view lab/test results, encounter notes, upcoming appointments, etc.  Non-urgent messages can be sent to your provider as well.   ?To learn more about what you can do with MyChart, go to [email or web address].   ? ?Your next appointment:   ?1 year(s) ? ?The format for your next appointment:   ?In Person ? ?Provider:   ?Virl Axe, MD  ? ? ?Other Instructions ?N/a ? ?Important Information About Sugar ? ? ? ? ? ? ?

## 2021-11-14 NOTE — Progress Notes (Signed)
? ? ? ? ?ELECTROPHYSIOLOGY CONSULT NOTE  ?Patient ID: Paul Bullock, MRN: [id number], DOB/AGE: Oct 21, 1945 76 y.o. ?Admit date: (Not on file) ?Date of Consult: 11/14/2021 ? ?Primary Physician: Pcp, No ?Primary Cardiologist: DM ?  ?  ?Paul Bullock is a 76 y.o. male who is being seen today for the evaluation of ICD at the request of DM.  ? ? ?HPI ?Paul Bullock is a 76 y.o. male seen to establish follow-up for an ICD implanted in [facility] for primary prevention ?Followed in [facility] 2016.  He has moved to [facility]. ? ?His device is never gone off. ? ? ?History of ischemic heart disease with an MI 2002 requiring stenting of a marginal..  Interval moderate coronary disease, treated medically. ?3/23 recurrent chest pain and non-STEMI.  Underwent LAD DES x2. ? ?Denies sleep disordered breathing.  No PND orthopnea.  Denies chest pain.  Is able to walk 100 yards.  No palpitations. ? ?He was recently seen by Dr. DM down titrated his furosemide at that time, his edema was described as trace. ? ?Longstanding polycythemia not evaluated.  Is noted to have a low platelet count. ? ?DATE TEST EF   ?3/23 Echo   20-25 %   ?3/23 LHC    % LADp-m: 90>>stent; ?CXp:20/stent patent    ?     ? ?Date Cr K Hgb  ?10/22   18.5  ?3/23 1.51<,1.02 3.9<<5.0 19.5<<17.5  ?      ? ? ? ? ?Past Medical History:  ?Diagnosis Date  ? AICD (automatic cardioverter/defibrillator) present   ? a. 11/2014 s/p MDT DDMB 1D4 [facility] MRI XT DR AICD (ser# [id number]).  ? Bladder cancer ([place])   ? a. 2013 s/p chemo/xrt.  ? CAD (coronary artery disease)   ? a. 11/2020 s/p MI/cardiac arrest-->DES to LCX x 1; b. 10/2011 Cath: LAD 82mw/ aneurysmal post-stenotic dilation-->med rx. LCX 50d to prior stent; c. 10/2021 NSTEMI/Cath: LM 30ost/m, LAD 90p/m, 949mLCX 20p/m, patent stent, LPAV 60, RCA mild diff dzs, EF<25%, LVEDP 4018m.  ? Chronic HFrEF (heart [place] with reduced [place] fraction[place] ? a. EF prev as low as 20% w/ subsequent improvement to 50-55%; b. 11/2020  limited echo: "windows were challenging...LVEF largely preserved."; c. 10/2021 LV gram: EF <25%, LVEDP 103m1m  ? COPD (chronic obstructive pulmonary disease) (HCC)[place]? Depression   ? Diverticulitis   ? DVT (deep venous thrombosis) (HCC)[place]? Essential hypertension   ? Hyperlipidemia LDL goal <70   ? Ischemic cardiomyopathy   ? a. EF prev as low as 20%; b. 11/2014 s/p MDT [id number] MRI XT DR AICD; c. subsequent improvement in EF to 50-55%; d. 11/2020 limited echo: "windows were challenging...LVEF largely preserved."; e. 10/2021 LV Gram: EF <25%.  ? MI (myocardial infarction) (HCC)[address]? Migraines   ? Morbid obesity (HCC)[place]? 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  ? Thrombocytopenia (HCC)[place]? Tobacco abuse   ? Varicose veins of left lower extremity   ?   ? ?Surgical History:  ?Past Surgical History:  ?Procedure Laterality Date  ? CARDIAC DEFIBRILLATOR PLACEMENT    ? CORONARY STENT INTERVENTION N/A 10/18/2021  ? Procedure: CORONARY STENT INTERVENTION;  Surgeon: AridWellington Hampshire;  [facility];  Service: Cardiovascular;  Laterality: N/A;  Prox and Mid LAD  ? MOHS SURGERY    ? PERCUTANEOUS CORONARY STENT INTERVENTION (PCI-S)    ? RIGHT/LEFT  HEART CATH AND CORONARY ANGIOGRAPHY N/A 10/13/2021  ? Procedure: RIGHT/LEFT HEART CATH AND CORONARY ANGIOGRAPHY;  Surgeon: [name], MD;  Location: [place] [facility];  Service: Cardiovascular;  Laterality: N/A;  ? VASECTOMY    ? VASECTOMY REVERSAL    ?  ? ?Home Meds: ?Current Meds  ?Medication Sig  ? albuterol (PROVENTIL) (2.5 MG/3ML) 0.083% nebulizer solution Take 2.5 mg by nebulization every 6 (six) hours as needed for wheezing or shortness of breath.  ? apixaban (ELIQUIS) 5 MG TABS tablet Take 5 mg by mouth 2 (two) times daily.  ? bisoprolol (ZEBETA) 5 MG tablet Take 1 tablet (5 mg total) by mouth daily.  ? Cholecalciferol (VITAMIN D-3) 125 MCG (5000 UT) TABS Take 5,000 Units by mouth daily.  ? clopidogrel (PLAVIX) 75 MG tablet  Take 1 tablet (75 mg total) by mouth daily.  ? Cyanocobalamin 2500 MCG TABS Take 2,500 mcg by mouth daily.  ? empagliflozin (JARDIANCE) 10 MG TABS tablet Take 1 tablet (10 mg total) by mouth daily.  ? fluticasone (FLONASE) 50 MCG/ACT nasal spray Place 2 sprays into both nostrils daily.  ? fluticasone furoate-vilanterol (BREO ELLIPTA) 100-25 MCG/ACT AEPB Inhale 1 puff into the lungs in the morning.  ? Multiple Vitamins-Minerals (MULTIVITAMIN WITH MINERALS) tablet Take 1 tablet by mouth daily.  ? nitroGLYCERIN (NITROSTAT) 0.4 MG SL tablet Place 1 tablet (0.4 mg total) under the tongue every 5 (five) minutes as needed for chest pain.  ? nystatin (MYCOSTATIN) 100000 UNIT/ML suspension Take 2 mLs (200,000 Units total) by mouth 4 (four) times daily.  ? Pitavastatin Calcium (LIVALO) 4 MG TABS Take 1 tablet (4 mg total) by mouth daily.  ? sacubitril-valsartan (ENTRESTO) 24-26 MG Take 1 tablet by mouth 2 (two) times daily.  ? spironolactone (ALDACTONE) 25 MG tablet Take 1 tablet (25 mg total) by mouth daily.  ? torsemide (DEMADEX) 20 MG tablet Take 0.5 tablets (10 mg total) by mouth daily.  ? ? ?Allergies: No Known Allergies ? ?Social History  ? ?Socioeconomic History  ? Marital status: Single  ?  Spouse name: Not on file  ? Number of children: Not on file  ? Years of education: Not on file  ? Highest education level: Not on file  ?Occupational History  ? Not on file  ?Tobacco Use  ? Smoking status: Every Day  ?  Packs/day: 0.75  ?  Years: 55.00  ?  Pack years: 41.25  ?  Types: Cigarettes  ? Smokeless tobacco: Never  ?Substance and Sexual Activity  ? Alcohol use: Never  ? Drug use: Never  ? Sexual activity: Not Currently  ?Other Topics Concern  ? Not on file  ?Social History Narrative  ? Since Dec 2022, he has been living in [place].  His ex-wife lives here and they are very close friends.  He does not routinely exercise.  ? ?Social Determinants of Health  ? ?Financial Resource Strain: Not on file  ?Food Insecurity: Not  on file  ?Transportation Needs: Not on file  ?Physical Activity: Not on file  ?Stress: Not on file  ?Social Connections: Not on file  ?Intimate Partner Violence: Not on file  ?  ? ?Family History  ?Problem Relation Age of Onset  ? Stroke Mother   ? Bladder Cancer Father   ? Heart attack Brother   ?  ? ?ROS:  Please see the history of present illness.     All other systems reviewed and negative.  ? ? ?Physical Exam: ?Blood pressure Marland Kitchen)  90/54, pulse 82, height '5\' 10"'$  (1.778 m), weight 254 lb (115.2 kg), SpO2 93 %. ?General: Well developed, Morbidly obese  male in no acute distress. ?Head: Normocephalic, atraumatic, sclera non-icteric, no xanthomas, nares are without discharge. ?EENT: normal  ?Lymph Nodes:  none ?Neck: Negative for carotid bruits. JVD not elevated. ?Back:without scoliosis kyphosis  ?Lungs: Clear bilaterally to auscultation without wheezes, rales, or rhonchi. Breathing is unlabored. ?Heart: RRR with S1 S2. No murmur . No rubs, or gallops appreciated. ?Abdomen: Soft, non-tender, non-distended with normoactive bowel sounds. No hepatomegaly. No rebound/guarding. No obvious abdominal masses. ?Msk:  Strength and tone appear normal for age. ?Extremities: No clubbing or cyanosis.  2+ edema.  Distal pedal pulses are 2+ and equal bilaterally. ?Skin: Warm and Dry ?Neuro: Alert and oriented X 3. CN III-XII intact Grossly normal sensory and motor function . ?Psych:  Responds to questions appropriately with a normal affect. ?  ?  ?  ? ?EKG: Sinus at 82 ?Levels 19/13/40 ?Inferior Q waves ? ? ?Assessment and Plan:  ?Cardiomyopathy-ischemic ? ?Congestive heart failure-chronic-systolic-class 2-3 ? ?Implantable defibrillator-Medtronic ? ?Polycythemia/thrombocytopenia ? ?Pulmonary embolism-remote ? ?COPD ? ?The patient has had no interval arrhythmias.  We have reviewed our strategies for device management and follow-up.  His device is programmed as a single zone device.  We will not make any adjustments on that.  Some  nonsustained ventricular tachycardia.  We will continue him on his bisoprolol.  5 mg a day. ? ?Continue Entresto 24/26 spironolactone 25. ? ?His indication for anticoagulation is pulmonary embolism.  Recomme

## 2021-11-15 LAB — BASIC METABOLIC PANEL
BUN/Creatinine Ratio: 15 (ref 10–24)
BUN: 17 mg/dL (ref 8–27)
CO2: 22 mmol/L (ref 20–29)
Calcium: 9.7 mg/dL (ref 8.6–10.2)
Chloride: 105 mmol/L (ref 96–106)
Creatinine, Ser: 1.14 mg/dL (ref 0.76–1.27)
Glucose: 118 mg/dL — ABNORMAL HIGH (ref 70–99)
Potassium: 4.3 mmol/L (ref 3.5–5.2)
Sodium: 139 mmol/L (ref 134–144)
eGFR: 68 mL/min/{1.73_m2} (ref >59)

## 2021-11-15 LAB — ERYTHROPOIETIN: Erythropoietin: 10.5 m[IU]/mL (ref 2.6–18.5)

## 2021-11-21 NOTE — Progress Notes (Incomplete)
***In Progress*** ? ?  ?Advanced Heart Failure Clinic Note  ?PCP: Pcp, No ?Cardiology: Dr. Aundra Dubin ? ?HPI:  ?76 y.o. with history of COPD, DVT/PE, ischemic cardiomyopathy, and CAD.  Patient suffered a myocardial infarction complicated by cardiac arrest in 2002 and required stenting of the obtuse marginal.  He notes that he subsequently had LV dysfunction.  In 2013, he required repeat diagnostic catheterization which revealed moderate disease distal to previously placed obtuse marginal stent with a 75% stenosis in the mid LAD with aneurysmal post stenotic dilation.  Per notes, he was medically managed.  In the setting of LV dysfunction with an EF as low as 20%, he underwent Medtronic AICD placement in April 2016, and up until 2022, he was followed closely by cardiology in [place], [place].  He did require admission to a hospital in [facility] in June 2002, in the setting of COPD exacerbation while visiting his son.  Per discharge summary, he was noted to have mild troponin elevation which was felt to be secondary to demand ischemia. ?  ?Paul Bullock moved to [place] in December 2022 and has been living locally.  His ex-wife lives nearby and they are close friends.  On October 12 2021, he developed recurrent chest discomfort and was admitted to [facility] with NSTEMI.  He underwent diagnostic catheterization at [facility] regional revealing severe proximal to mid LAD disease with otherwise nonobstructive disease and patent obtuse marginal stent.  His LVEDP was approximately 40 mmHg and ventriculogram showed an EF of less than 25%.  He was given Lasix in the lab and after discussion with advanced heart failure team at [facility], he was transferred for further evaluation and potential inotropic support.  At [facility], he was diuresed and started on GDMT.  He had DES x 2 to proximal and mid LAD and was discharged home.  ?  ?Presented on 10/30/21 to [facility] for post-hospital follow-up. He had been doing well at home.  Was  using nicotine patches and not smoking.  Reported no dyspnea walking up a flight of stairs.  He would get fatigued/short of breath after walking for about 1.5 hours.  No lightheadedness.  No chest pain.  No orthopnea/PND.  He said that his breathing was "fantastic."  Had not had to use his nebulizer.  Reported achy muscles on Crestor, similar with atorvastatin but was able to tolerate pitavastatin in the past without problems.  ? ?Today he returns to HF clinic for pharmacist medication titration. At last visit with MD torsemide was decreased to 10 mg daily. ? ?Overall feeling ***. ?Dizziness, lightheadedness, fatigue:  ?Chest pain or palpitations: ? ?How is your breathing?: *** ?SOB: ?Able to complete all ADLs. Activity level *** ? ?Weight at home pounds. Takes furosemide/torsemide/bumex *** mg *** daily.  ?LEE ?PND/Orthopnea ? ?Appetite *** ?Low-salt diet:  ? ?Physical Exam ?Cost/affordability of meds ?  ? ?Shortness of breath/dyspnea on exertion? {YES NO:22349}  ?Orthopnea/PND? {YES NO:22349} ?Edema? {YES NO:22349} ?Lightheadedness/dizziness? {YES NO:22349} ?Daily weights at home? {YES NO:22349} ?Blood pressure/heart rate monitoring at home? {YES NO:22349} ?Following low-sodium/fluid-restricted diet? {YES NO:22349} ? ?HF Medications: ?Bisoprolol 5 mg daily ?Entresto 24/26 mg BID ?Spironolactone 25 mg daily ?Jardiance 10 mg daily ?Torsemide 10 mg daily ? ?Has the patient been experiencing any side effects to the medications prescribed?  {YES NO:22349} ? ?Does the patient have any problems obtaining medications due to transportation or finances?   Medicare A/B, Tricare ? ?Understanding of regimen: {excellent/good/fair/poor:19665} ?Understanding of indications: {excellent/good/fair/poor:19665} ?Potential of compliance: {excellent/good/fair/poor:19665} ?Patient  understands to avoid NSAIDs. ?Patient understands to avoid decongestants. ?  ? ?Pertinent Lab Values: ?11/14/21: Serum creatinine 1.14, BUN 17, Potassium 4.3,  Sodium 139 ?10/27/21: Magnesium 2.5 ? ?Vital Signs: ?Weight: *** (last clinic weight: 244 lbs) 254 lbs ?Blood pressure: *** 90/54, 100/60  ?Heart rate: *** 82, 84 ? ?Assessment/Plan: ?1.  CAD: Patient has history of CAD with prior PCI to OM in 2002.  NSTEMI in 3/23 with cath showing complex proximal LAD disease with serial 90% stenoses separated by small aneurysmal areas.  He had a 60% mid LCx stenosis.  RCA with mild disease.  He had DES x 2 to proximal and mid LAD. No chest pain.  ?- He will need to be on anticoagulation due to previous DVT/PE (apixaban), on plavix for at least 6 months, ideally 1 year. Not on ASA.  ?- Myalgias with Crestor and atorvastatin, restart pitavastatin 4 mg daily.  Lipids/LFTs in 1 month.  ? ?2. Chronic systolic CHF: Ischemic cardiomyopathy.  Echo in 10/2021 with EF 20-25%, severe LV dilation, RV mildly dysfunctional.  RHC in 3/23 with markedly elevated filling pressures, CI 2.03, and PAPI < 1. He is not volume overloaded on exam. NYHA class II.  ?- Continue torsemide 10 mg daily ?- Continue bisoprolol 5 mg daily (beta-1 selective with COPD).  ?- Continue Entresto 24/26 bid.  No BP room to increase today.  ?- Continue Jardiance 10 mg daily.  ?- Continue spironolactone 25 mg daily.  ?- Repeat echo in 2 months, he will need ICD if EF low.  ? 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   ?- Continue apixaban.  ? ?4. COPD: He has quit smoking.  ? ?5. Thrombocytopenia: Mild, chronic.  ? ?6. Bladder cancer: Has been followed by cystoscopies   ?- He needs to establish with a urologist, referred to Dr. Alinda Money by Dr. Aundra Dubin.   ? ?Follow up 12/08/21 with APP clinic, 02/26/22 with Dr. Aundra Dubin.  ? ?Audry Riles, PharmD, BCPS, BCCP, CPP ?Heart Failure Clinic Pharmacist ?[phone] ? ? ?

## 2021-11-22 ENCOUNTER — Ambulatory Visit (HOSPITAL_COMMUNITY)
Admission: RE | Admit: 2021-11-22 | Discharge: 2021-11-22 | Disposition: A | Discharge status: Home / Self Care | Payer: Medicare Other | Source: Ambulatory Visit | Attending: Cardiology | Admitting: Cardiology

## 2021-11-22 VITALS — BP 101/52 | HR 78 | Wt 255.0 lb

## 2021-11-22 DIAGNOSIS — Z8674 Personal history of sudden cardiac arrest: Secondary | ICD-10-CM | POA: Insufficient documentation

## 2021-11-22 DIAGNOSIS — Z7984 Long term (current) use of oral hypoglycemic drugs: Secondary | ICD-10-CM | POA: Insufficient documentation

## 2021-11-22 DIAGNOSIS — I255 Ischemic cardiomyopathy: Secondary | ICD-10-CM | POA: Diagnosis not present

## 2021-11-22 DIAGNOSIS — Z86718 Personal history of other venous thrombosis and embolism: Secondary | ICD-10-CM | POA: Diagnosis not present

## 2021-11-22 DIAGNOSIS — I2510 Atherosclerotic heart disease of native coronary artery without angina pectoris: Secondary | ICD-10-CM | POA: Insufficient documentation

## 2021-11-22 DIAGNOSIS — R0602 Shortness of breath: Secondary | ICD-10-CM | POA: Insufficient documentation

## 2021-11-22 DIAGNOSIS — M791 Myalgia, unspecified site: Secondary | ICD-10-CM | POA: Insufficient documentation

## 2021-11-22 DIAGNOSIS — I252 Old myocardial infarction: Secondary | ICD-10-CM | POA: Insufficient documentation

## 2021-11-22 DIAGNOSIS — Z7901 Long term (current) use of anticoagulants: Secondary | ICD-10-CM | POA: Diagnosis not present

## 2021-11-22 DIAGNOSIS — Z79899 Other long term (current) drug therapy: Secondary | ICD-10-CM | POA: Insufficient documentation

## 2021-11-22 DIAGNOSIS — C679 Malignant neoplasm of bladder, unspecified: Secondary | ICD-10-CM | POA: Diagnosis not present

## 2021-11-22 DIAGNOSIS — Z87891 Personal history of nicotine dependence: Secondary | ICD-10-CM | POA: Diagnosis not present

## 2021-11-22 DIAGNOSIS — D696 Thrombocytopenia, unspecified: Secondary | ICD-10-CM | POA: Insufficient documentation

## 2021-11-22 DIAGNOSIS — J441 Chronic obstructive pulmonary disease with (acute) exacerbation: Secondary | ICD-10-CM | POA: Diagnosis not present

## 2021-11-22 DIAGNOSIS — Z86711 Personal history of pulmonary embolism: Secondary | ICD-10-CM | POA: Diagnosis not present

## 2021-11-22 DIAGNOSIS — I5022 Chronic systolic (congestive) heart failure: Secondary | ICD-10-CM | POA: Insufficient documentation

## 2021-11-22 MED ORDER — TORSEMIDE 20 MG PO TABS: 10.0000 mg | ORAL_TABLET | Freq: Every day | ORAL | 3 refills | Status: DC

## 2021-11-22 NOTE — Patient Instructions (Signed)
It was a pleasure seeing you today! ? ?MEDICATIONS: ?-We are changing your medications today ?-Increase torsemide to 20 mg (1 tablet) daily for 2 days then return to 10 mg (half-tablet daily) ?-Take an extra half-tablet as need for weight gain or edema (3 lbs in 1 day or 5 lbs in 1 week) ?-Call if you have questions about your medications. ? ?LABS: ?-We will call you if your labs need attention. ? ?NEXT APPOINTMENT: ?Return to clinic in 12/08/21 with APP. ? ?In general, to take care of your heart failure: ?-Limit your fluid intake to 2 Liters (half-gallon) per day.   ?-Limit your salt intake to ideally 2-3 grams (2000-3000 mg) per day. ?-Weigh yourself daily and record, and bring that "weight diary" to your next appointment.  (Weight gain of 2-3 pounds in 1 day typically means fluid weight.) ?-The medications for your heart are to help your heart and help you live longer.   ?-Please contact us before stopping any of your heart medications. ? ?Call the clinic at [phone] with questions or to reschedule future appointments.  ?

## 2021-11-22 NOTE — Progress Notes (Signed)
?  ?Advanced Heart Failure Clinic Note  ? ?PCP: Pcp, No ?Cardiology: Dr. Aundra Dubin ? ?HPI:  ?76 y.o. with history of COPD, DVT/PE, ischemic cardiomyopathy, and CAD.  Patient suffered a myocardial infarction complicated by cardiac arrest in 2002 and required stenting of the obtuse marginal.  He notes that he subsequently had LV dysfunction.  In 2013, he required repeat diagnostic catheterization which revealed moderate disease distal to previously placed obtuse marginal stent with a 75% stenosis in the mid LAD with aneurysmal post stenotic dilation.  Per notes, he was medically managed.  In the setting of LV dysfunction with an EF as low as 20%, he underwent Medtronic AICD placement in April 2016, and up until 2022, he was followed closely by cardiology in [place], [place].  He did require admission to a hospital in [facility] in June 2002, in the setting of COPD exacerbation while visiting his son.  Per discharge summary, he was noted to have mild troponin elevation which was felt to be secondary to demand ischemia. ?  ?Mr. Fennema moved to [place] in December 2022 and has been living locally.  His ex-wife lives nearby and they are close friends.  On October 12 2021, he developed recurrent chest discomfort and was admitted to [facility] with NSTEMI.  He underwent diagnostic catheterization at [facility] regional revealing severe proximal to mid LAD disease with otherwise nonobstructive disease and patent obtuse marginal stent.  His LVEDP was approximately 40 mmHg and ventriculogram showed an EF of less than 25%.  He was given Lasix in the lab and after discussion with advanced heart failure team at [facility], he was transferred for further evaluation and potential inotropic support.  At [facility], he was diuresed and started on GDMT.  He had DES x 2 to proximal and mid LAD and was discharged home.  ?  ?Presented to AHF clinic on 10/30/21 for post-hospital follow-up. He had been doing well at home.  Was using  nicotine patches and not smoking.  Reported no dyspnea walking up a flight of stairs.  He would get fatigued/short of breath after walking for about 1.5 hours.  No lightheadedness.  No chest pain.  No orthopnea/PND.  He said that his breathing was "fantastic."  Had not had to use his nebulizer.  Reported achy muscles on rosuvastatin, similar with atorvastatin but was able to tolerate pitavastatin in the past without problems.  ? ?Today he returns to HF clinic for pharmacist medication titration. At last visit with MD, rosuvastatin was changed to pitavastatin due to persistent myalgias. After the visit, labs resulted and serum creatinine was increased to 1.51 so torsemide was held for two days and then decreased to 10 mg daily. Additionally, his apixaban was decreased to 2.5 mg BID by Dr. Caryl Comes. Overall he is feeling good today. Denies dizziness/ lightheadedness/fatigue. Denies chest pain/palpitations. Says his breathing is good, sometimes gets SOB after running multiple errands. Able to complete all ADLs. Does not weigh himself regularly at home, denies having a scale. Takes torsemide 10 mg daily and has not taken any extra. 1+ bilateral LEE on exam. Denies PND/orthopnea. States his appetite is good, and he adheres to a low sodium diet.  ? ?HF Medications: ?Bisoprolol 5 mg daily ?Entresto 24/26 mg BID ?Spironolactone 25 mg daily ?Jardiance 10 mg daily ?Torsemide 10 mg daily ? ?Has the patient been experiencing any side effects to the medications prescribed?  No ? ?Does the patient have any problems obtaining medications due to transportation or finances?  Medicare A/B, Tricare ? ?Understanding of regimen: excellent ?Understanding of indications: excellent ?Potential of compliance: excellent ?Patient understands to avoid NSAIDs. ?Patient understands to avoid decongestants. ?  ? ?Pertinent Lab Values: ?11/14/21: Serum creatinine 1.14, BUN 17, Potassium 4.3, Sodium 139 ?10/27/21: Magnesium 2.5 ? ?Vital Signs: ?Weight:  255 lbs (last clinic weight: 244 lbs) ?Blood pressure: 101/52 ?Heart rate: 78 ? ?Assessment/Plan: ?1.  CAD: Patient has history of CAD with prior PCI to OM in 2002.  NSTEMI in 3/23 with cath showing complex proximal LAD disease with serial 90% stenoses separated by small aneurysmal areas.  He had a 60% mid LCx stenosis.  RCA with mild disease.  He had DES x 2 to proximal and mid LAD. No chest pain.  ?- He will need to be on anticoagulation due to previous DVT/PE (apixaban), on plavix for at least 6 months, ideally 1 year. Not on ASA.  ?- Myalgias with rosuvastatin and atorvastatin, continue pitavastatin 4 mg daily.  Lipids/LFTs in 1 month.  ? ?2. Chronic systolic CHF: Ischemic cardiomyopathy.  Echo in 10/2021 with EF 20-25%, severe LV dilation, RV mildly dysfunctional.  RHC in 10/2021 with markedly elevated filling pressures, CI 2.03, and PAPI < 1.  ?- NYHA class II. 1+ bilateral LEE on exam.   ?- Instructed patient to take torsemide 20 mg today and tomorrow, then continue torsemide 10 mg daily. Counseled patient to obtain a scale and weight himself daily. Counseled patient to take torsemide 20 mg PRN for fluid retention (weight gain of 3 lbs overnight or 5 lbs in a week).  ?- Continue bisoprolol 5 mg daily (beta-1 selective with COPD). Consider increasing at next visit if fluid status/blood pressure allow.  ?- Continue Entresto 24/26 mg BID.  No BP room to increase today. ?- Continue spironolactone 25 mg daily.  ?- Continue Jardiance 10 mg daily.   ?- Repeat echo in 2 months.  ? 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   ?- Continue low dose apixaban 2.5 mg BID per Amplify-Ext trial.  ? ?4. COPD: He has quit smoking.  ? ?5. Thrombocytopenia: Mild, chronic.  ? ?6. Bladder cancer: Has been followed by cystoscopies   ?- He needs to establish with a urologist, referred to Dr. Alinda Money by Dr. Aundra Dubin.   ? ?Follow up 12/08/21 with APP clinic, 02/26/22 with Dr. Aundra Dubin.  ? ?Audry Riles, PharmD, BCPS, BCCP, CPP ?Heart Failure  Clinic Pharmacist ?[phone] ? ? ?

## 2021-12-05 ENCOUNTER — Ambulatory Visit (INDEPENDENT_AMBULATORY_CARE_PROVIDER_SITE_OTHER): Payer: Medicare Other | Admitting: Internal Medicine

## 2021-12-05 ENCOUNTER — Encounter: Payer: Self-pay | Admitting: Internal Medicine

## 2021-12-05 VITALS — BP 97/60 | HR 79 | Ht 70.0 in | Wt 248.6 lb

## 2021-12-05 DIAGNOSIS — Z86711 Personal history of pulmonary embolism: Secondary | ICD-10-CM

## 2021-12-05 DIAGNOSIS — D751 Secondary polycythemia: Secondary | ICD-10-CM

## 2021-12-05 DIAGNOSIS — Z8551 Personal history of malignant neoplasm of bladder: Secondary | ICD-10-CM

## 2021-12-05 DIAGNOSIS — E119 Type 2 diabetes mellitus without complications: Secondary | ICD-10-CM | POA: Diagnosis not present

## 2021-12-05 DIAGNOSIS — K635 Polyp of colon: Secondary | ICD-10-CM

## 2021-12-05 DIAGNOSIS — I214 Non-ST elevation (NSTEMI) myocardial infarction: Secondary | ICD-10-CM

## 2021-12-05 DIAGNOSIS — J441 Chronic obstructive pulmonary disease with (acute) exacerbation: Secondary | ICD-10-CM

## 2021-12-05 DIAGNOSIS — Z0000 Encounter for general adult medical examination without abnormal findings: Secondary | ICD-10-CM

## 2021-12-05 DIAGNOSIS — F17200 Nicotine dependence, unspecified, uncomplicated: Secondary | ICD-10-CM | POA: Diagnosis not present

## 2021-12-05 DIAGNOSIS — L918 Other hypertrophic disorders of the skin: Secondary | ICD-10-CM

## 2021-12-05 DIAGNOSIS — R202 Paresthesia of skin: Secondary | ICD-10-CM

## 2021-12-05 DIAGNOSIS — Z23 Encounter for immunization: Secondary | ICD-10-CM | POA: Diagnosis not present

## 2021-12-05 NOTE — Assessment & Plan Note (Signed)
Two drug eluting stents placed in March 2023, will need 6 months of plavix + eliquis, not on aspirin. Follows with cardiology.  ?

## 2021-12-05 NOTE — Progress Notes (Signed)
? ?  CC: skin tags ? ?HPI: ? ?[name] is a 76 y.o. PMH noted below, who presents to the [facility] with complaints of skin tags. To see the management of his acute and chronic conditions, please refer to the A&P note under the encounters tab.  ? ?Past Medical History:  ?Diagnosis Date  ? AICD (automatic cardioverter/defibrillator) present   ? a. 11/2014 s/p MDT DDMB 1D4 [facility] MRI XT DR AICD (ser# [id number]).  ? Bladder cancer ([place])   ? a. 2013 s/p chemo/xrt.  ? CAD (coronary artery disease)   ? a. 11/2020 s/p MI/cardiac arrest-->DES to LCX x 1; b. 10/2011 Cath: LAD 67mw/ aneurysmal post-stenotic dilation-->med rx. LCX 50d to prior stent; c. 10/2021 NSTEMI/Cath: LM 30ost/m, LAD 90p/m, 93mLCX 20p/m, patent stent, LPAV 60, RCA mild diff dzs, EF<25%, LVEDP 4020m.  ? Chronic HFrEF (heart failure with reduced ejection fraction) ([place] ? a. EF prev as low as 20% w/ subsequent improvement to 50-55%; b. 11/2020 limited echo: "windows were challenging...LVEF largely preserved."; c. 10/2021 LV gram: EF <25%, LVEDP 90m59m  ? COPD (chronic obstructive pulmonary disease) (HCC)[place]? Depression   ? Diverticulitis   ? DVT (deep venous thrombosis) (HCC)[place]? Essential hypertension   ? Hyperlipidemia LDL goal <70   ? Ischemic cardiomyopathy   ? a. EF prev as low as 20%; b. 11/2014 s/p MDT [id number] MRI XT DR AICD; c. subsequent improvement in EF to 50-55%; d. 11/2020 limited echo: "windows were challenging...LVEF largely preserved."; e. 10/2021 LV Gram: EF <25%.  ? MI (myocardial infarction) (HCC)[place]? Migraines   ? Morbid obesity (HCC)[place]? 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  ? Thrombocytopenia (HCC)[place]? Tobacco abuse   ? Varicose veins of left lower extremity   ? ?Fam Hx:  ?Family History  ?Problem Relation Age of Onset  ? Stroke Mother   ? Bladder Cancer Father   ? Heart attack Brother   ?Daughter- living with breast cancer ? ?Social history: ?Lives alone, good social support system, ex wife lives in [place]  area, [place], [place] alcohol use, smokes 10 cigarettes per day (55 pack years) trying to quit ? ?Review of Systems:  negative for shortness of breath, chest pain, leg swelling, abdominal pain ? ?Physical Exam: ?Gen: obese man in NAD ?HEENT: normocephalic atraumatic, MMM ?CV: RRR, no m/r/g, trace LEE ?Resp: CTAB, normal WOB  ?GI: soft, nontender ?MSK: moves all extremities without difficulty ?Skin:warm and dry; verrucous stuck on plaques on the bilateral cheeks, acrochordon overlying the left eyelid; normal skin appearance over the thoracic back with no eruptions or lesions ?Neuro:alert answering questions appropriately ?Psych: normal affect ? ? ?Assessment & Plan:  ? ?See Encounters Tab for problem based charting. ? ?Patient discussed with Dr. WillJimmye Norman?

## 2021-12-05 NOTE — Patient Instructions (Addendum)
Paul Bullock ? ?It was a pleasure seeing you in the clinic today.  ? ?We talked about your heart, your diabetes, your skin tags, and your smoking. We also gave you the pneumonia shot.  ? ?Heart- continue to follow with your heart doctor ? ?2. Diabetes- continue to work on healthy eating. We can send you to a nutritionist in the future if you are interested. ? ?3. Smoking- continue to work on quitting smoking ? ?Please call our clinic at [phone] if you have any questions or concerns. The best time to call is Monday-Friday from 9am-4pm, but there is someone available 24/7 at the same number. If you need medication refills, please notify your pharmacy one week in advance and they will send [place] a request. ?  ?Thank you for letting us take part in your care. We look forward to seeing you next time! ? ?

## 2021-12-05 NOTE — Assessment & Plan Note (Signed)
Patient would like to quit smoking. Discussed options to support cessation, patient would like to work on it by himself for now. Was able to quit for several weeks but restarted when his daughter was diagnosed with breast cancer. Only smoking 10 cigarettes per day right now and motivated to cut down.  ?

## 2021-12-05 NOTE — Assessment & Plan Note (Signed)
Workup initiated by cardiology, IM to take over starting at follow up visit. ?

## 2021-12-05 NOTE — Assessment & Plan Note (Signed)
Patient reports getting a colonoscopy with polyps found in about 2018. Recommended to follow up in 3-5 years at that time. Will be due for a colonoscopy but given recent heart attack should wait until at least >6 months from March 2023.  ? ?

## 2021-12-05 NOTE — Assessment & Plan Note (Signed)
Patient very motivated to exercise and lose weight and to improve A1c. Started on jardiance in march. Offered referral to diabetes coordinator however patient wanted to try to work on it by himself for now. Up to date on his eye exam, had one three weeks ago.  ?- foot exam today ?- offer referral to [facility] again at next visit.  ?

## 2021-12-05 NOTE — Assessment & Plan Note (Signed)
Patient doing well on apixaban ?

## 2021-12-05 NOTE — Assessment & Plan Note (Addendum)
Patient working on quitting smoking. Feels breathing has improved since stents were placed.  ?

## 2021-12-05 NOTE — Assessment & Plan Note (Signed)
Diagnosed in about 2012, had chemo and radiation at that time. Scheduled for cystoscopy early in May with Dr. Alinda Money.  ?

## 2021-12-05 NOTE — Assessment & Plan Note (Signed)
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 No skin changes seen on exam.  ?

## 2021-12-05 NOTE — Assessment & Plan Note (Signed)
Sits above and below patient's left eye, bothersome to patient. ?- dermatology referral ?

## 2021-12-08 ENCOUNTER — Encounter (HOSPITAL_COMMUNITY): Payer: Self-pay

## 2021-12-08 ENCOUNTER — Ambulatory Visit (HOSPITAL_COMMUNITY)
Admission: RE | Admit: 2021-12-08 | Discharge: 2021-12-08 | Disposition: A | Discharge status: Home / Self Care | Payer: Medicare Other | Source: Ambulatory Visit | Attending: Family Medicine | Admitting: Family Medicine

## 2021-12-08 VITALS — BP 104/70 | HR 72 | Wt 245.8 lb

## 2021-12-08 DIAGNOSIS — J449 Chronic obstructive pulmonary disease, unspecified: Secondary | ICD-10-CM | POA: Diagnosis not present

## 2021-12-08 DIAGNOSIS — Z7901 Long term (current) use of anticoagulants: Secondary | ICD-10-CM | POA: Diagnosis not present

## 2021-12-08 DIAGNOSIS — Z955 Presence of coronary angioplasty implant and graft: Secondary | ICD-10-CM | POA: Insufficient documentation

## 2021-12-08 DIAGNOSIS — C679 Malignant neoplasm of bladder, unspecified: Secondary | ICD-10-CM | POA: Diagnosis not present

## 2021-12-08 DIAGNOSIS — I2510 Atherosclerotic heart disease of native coronary artery without angina pectoris: Secondary | ICD-10-CM | POA: Insufficient documentation

## 2021-12-08 DIAGNOSIS — J441 Chronic obstructive pulmonary disease with (acute) exacerbation: Secondary | ICD-10-CM

## 2021-12-08 DIAGNOSIS — I5022 Chronic systolic (congestive) heart failure: Secondary | ICD-10-CM | POA: Insufficient documentation

## 2021-12-08 DIAGNOSIS — I252 Old myocardial infarction: Secondary | ICD-10-CM | POA: Diagnosis not present

## 2021-12-08 DIAGNOSIS — Z8249 Family history of ischemic heart disease and other diseases of the circulatory system: Secondary | ICD-10-CM | POA: Diagnosis not present

## 2021-12-08 DIAGNOSIS — Z86711 Personal history of pulmonary embolism: Secondary | ICD-10-CM | POA: Insufficient documentation

## 2021-12-08 DIAGNOSIS — M791 Myalgia, unspecified site: Secondary | ICD-10-CM | POA: Diagnosis not present

## 2021-12-08 DIAGNOSIS — D696 Thrombocytopenia, unspecified: Secondary | ICD-10-CM | POA: Insufficient documentation

## 2021-12-08 DIAGNOSIS — Z8551 Personal history of malignant neoplasm of bladder: Secondary | ICD-10-CM

## 2021-12-08 DIAGNOSIS — I255 Ischemic cardiomyopathy: Secondary | ICD-10-CM | POA: Insufficient documentation

## 2021-12-08 DIAGNOSIS — Z79899 Other long term (current) drug therapy: Secondary | ICD-10-CM | POA: Diagnosis not present

## 2021-12-08 DIAGNOSIS — Z7984 Long term (current) use of oral hypoglycemic drugs: Secondary | ICD-10-CM | POA: Insufficient documentation

## 2021-12-08 DIAGNOSIS — Z86718 Personal history of other venous thrombosis and embolism: Secondary | ICD-10-CM | POA: Insufficient documentation

## 2021-12-08 LAB — COMPREHENSIVE METABOLIC PANEL
ALT: 21 U/L (ref 0–44)
AST: 22 U/L (ref 15–41)
Albumin: 3.8 g/dL (ref 3.5–5.0)
Alkaline Phosphatase: 66 U/L (ref 38–126)
Anion gap: 9 (ref 5–15)
BUN: 18 mg/dL (ref 8–23)
CO2: 23 mmol/L (ref 22–32)
Calcium: 9.3 mg/dL (ref 8.9–10.3)
Chloride: 102 mmol/L (ref 98–111)
Creatinine, Ser: 1.43 mg/dL — ABNORMAL HIGH (ref 0.61–1.24)
GFR, Estimated: 52 mL/min — ABNORMAL LOW (ref >60)
Glucose, Bld: 106 mg/dL — ABNORMAL HIGH (ref 70–99)
Potassium: 4.2 mmol/L (ref 3.5–5.1)
Sodium: 134 mmol/L — ABNORMAL LOW (ref 135–145)
Total Bilirubin: 2.3 mg/dL — ABNORMAL HIGH (ref 0.3–1.2)
Total Protein: 6.7 g/dL (ref 6.5–8.1)

## 2021-12-08 LAB — LIPID PANEL
Cholesterol: 112 mg/dL (ref 0–200)
HDL: 36 mg/dL — ABNORMAL LOW (ref >40)
LDL Cholesterol: 56 mg/dL (ref 0–99)
Total CHOL/HDL Ratio: 3.1 RATIO
Triglycerides: 100 mg/dL (ref <150)
VLDL: 20 mg/dL (ref 0–40)

## 2021-12-08 NOTE — Patient Instructions (Signed)
Thank you for coming in today ? ?Labs were done today, if any labs are abnormal the clinic will call you ?No news is good news ? ?Your physician recommends that you schedule a follow-up appointment in:  ?Please keep follow up appointment with Dr. Aundra Dubin and echocardiogram ? ? ?At the [facility], you and your health needs are our priority. As part of our continuing mission to provide you with exceptional heart care, we have created designated Provider Care Teams. These Care Teams include your primary Cardiologist (physician) and Advanced Practice Providers (APPs- Physician Assistants and Nurse Practitioners) who all work together to provide you with the care you need, when you need it.  ? ?You may see any of the following providers on your designated Care Team at your next follow up: ?Dr Glori Bickers ?Dr Loralie Champagne ?Darrick Grinder, NP ?Lyda Jester, PA ?Jessica Milford,NP ?Marlyce Huge, PA ?Audry Riles, PharmD ? ? ?Please be sure to bring in all your medications bottles to every appointment.  ? ?If you have any questions or concerns before your next appointment please send [place] a message through [place] or call our office at [phone].   ? ?TO LEAVE A MESSAGE FOR THE NURSE SELECT OPTION 2, PLEASE LEAVE A MESSAGE INCLUDING: ?YOUR NAME ?DATE OF BIRTH ?CALL BACK NUMBER ?REASON FOR CALL**this is important as we prioritize the call backs ? ?YOU WILL RECEIVE A CALL BACK THE SAME DAY AS LONG AS YOU CALL BEFORE 4:00 PM ? ?

## 2021-12-08 NOTE — Progress Notes (Signed)
PCP: Lottie Mussel, MD ?HF Cardiology: Dr. Aundra Dubin ? ?Paul Bullock 76 y.o. with history of COPD, DVT/PE, ischemic cardiomyopathy, and CAD presents for followup of CHF after recent hospitalization.  Patient suffered a myocardial infarction complicated by cardiac arrest in 2002 and required stenting of the obtuse marginal.  He notes that he subsequently had LV dysfunction.  In 2013, he required repeat diagnostic catheterization which revealed moderate disease distal to previously placed obtuse marginal stent with a 75% stenosis in the mid LAD with aneurysmal post stenotic dilation.  Per notes, he was medically managed.  In the setting of LV dysfunction with an EF as low as 20%, he underwent Medtronic AICD placement in April 2016, and up until 2022, he was followed closely by cardiology in [place], [place].  He did require admission to a hospital in [facility] in June 2002, in the setting of COPD exacerbation while visiting his son.  Per discharge summary, he was noted to have mild troponin elevation which was felt to be secondary to demand ischemia. ?  ?Paul Bullock moved to [place] in December 2022 and has been living locally.  His ex-wife lives nearby and they are close friends.  On October 12 2021, he developed recurrent chest discomfort and was admitted to [facility] with NSTEMI.  He underwent diagnostic catheterization at [facility] regional revealing severe proximal to mid LAD disease with otherwise nonobstructive disease and patent obtuse marginal stent.  His LVEDP was approximately 40 mmHg and ventriculogram showed an EF of less than 25%.  He was given Lasix in the lab and after discussion with advanced heart failure team at [facility], he was transferred for further evaluation and potential inotropic support.  At [facility], he was diuresed and started on GDMT.  He had DES x 2 to proximal and mid LAD and was discharged home.  ? ?Today he returns for HF follow up. Overall feeling fine. He does not have  dyspnea with activity. Denies palpitations, abnormal bleeding, CP, dizziness, edema, or PND/Orthopnea. Appetite ok. No fever or chills. Weight at home 243-244 pounds. Taking all medications. Back to smoking 1/22 ppd after daughter recently diagnosed with breast CA. Plans to join a gym this week. ? ?ECG (personally reviewed): none ordered today. ? ?Medtronic device interrogation (personally reviewed): OptiVol down, thoracic impedence up, 1 hour day/activity, no AF, no VT. ? ?Labs (3/23): K 4.6, creatinine 1.02, LDL 59, hgb 17.7, plts 98K ?Labs (4/23): K 4.3, creatinine 1.14 ? ?PMH: ?1. COPD: Quit smoking in 3/23.  ?2. DVT/PE ?3. Chronic systolic CHF: Ischemic cardiomyopathy.  Medtronic ICD from 4/16.  ?- Echo (3/23): EF 20-25% ?- RHC (3/23): mean RA 25, PA 62/47, mean PCWP 39, CI 2.03.  ?4. CAD: MI with OM2 stent in 2002.  ?- NSTEMI 3/23 with DES x 2 to proximal and mid LAD.  ?5. Chronic mild thrombocytopenia.  ?6. Bladder cancer 2012: Chemoradiation.  ?7. Type 2 DM ? 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  Quit smoking in 3/23.  No ETOH.  ? ?Family History  ?Problem Relation Age of Onset  ? Stroke Mother   ? Bladder Cancer Father   ? Heart attack Brother   ? ?ROS: All systems reviewed and negative except as per HPI.  ? ?Current Outpatient Medications  ?Medication Sig Dispense Refill  ? albuterol (PROVENTIL) (2.5 MG/3ML) 0.083% nebulizer solution Take 2.5 mg by nebulization every 6 (six) hours as needed for wheezing or shortness of breath.    ? apixaban (ELIQUIS) 2.5  MG TABS tablet Take 1 tablet (2.5 mg total) by mouth 2 (two) times daily. 180 tablet 3  ? bisoprolol (ZEBETA) 5 MG tablet Take 1 tablet (5 mg total) by mouth daily. 90 tablet 3  ? Cholecalciferol (VITAMIN D-3) 125 MCG (5000 UT) TABS Take 5,000 Units by mouth daily.    ? clopidogrel (PLAVIX) 75 MG tablet Take 1 tablet (75 mg total) by mouth daily. 90 tablet 3  ? Cyanocobalamin 2500 MCG TABS Take 2,500 mcg by mouth daily.    ?  empagliflozin (JARDIANCE) 10 MG TABS tablet Take 1 tablet (10 mg total) by mouth daily. 90 tablet 3  ? fluticasone (FLONASE) 50 MCG/ACT nasal spray Place 2 sprays into both nostrils daily.    ? fluticasone furoate-vilanterol (BREO ELLIPTA) 100-25 MCG/ACT AEPB Inhale 1 puff into the lungs in the morning.    ? Multiple Vitamins-Minerals (MULTIVITAMIN WITH MINERALS) tablet Take 1 tablet by mouth daily.    ? nitroGLYCERIN (NITROSTAT) 0.4 MG SL tablet Place 1 tablet (0.4 mg total) under the tongue every 5 (five) minutes as needed for chest pain. 25 tablet 3  ? nystatin (MYCOSTATIN) 100000 UNIT/ML suspension Take 2 mLs (200,000 Units total) by mouth 4 (four) times daily. 473 mL 0  ? Pitavastatin Calcium (LIVALO) 4 MG TABS Take 1 tablet (4 mg total) by mouth daily. 90 tablet 3  ? sacubitril-valsartan (ENTRESTO) 24-26 MG Take 1 tablet by mouth 2 (two) times daily. 180 tablet 3  ? spironolactone (ALDACTONE) 25 MG tablet Take 1 tablet (25 mg total) by mouth daily. 90 tablet 3  ? torsemide (DEMADEX) 20 MG tablet Take 0.5 tablets (10 mg total) by mouth daily. Take an extra half tablet PRN for weight gain/edema. (3lbs in 1 day or 5 lbs in 1 week) 90 tablet 3  ? ?No current facility-administered medications for this encounter.  ? ?Wt Readings from Last 3 Encounters:  ?12/08/21 111.5 kg (245 lb 12.8 oz)  ?12/05/21 112.8 kg (248 lb 9.6 oz)  ?11/22/21 115.7 kg (255 lb)  ? ?BP 104/70   Pulse 72   Wt 111.5 kg (245 lb 12.8 oz)   SpO2 95%   BMI 35.27 kg/m?  ?Physical Exam: ?General:  NAD. No resp difficulty ?HEENT: Normal ?Neck: Supple. No JVD. Carotids 2+ bilat; no bruits. No lymphadenopathy or thryomegaly appreciated. ?Cor: PMI nondisplaced. Regular rate & rhythm. No rubs, gallops or murmurs. ?Lungs: Clear ?Abdomen: Obese, nontender, nondistended. No hepatosplenomegaly. No bruits or masses. Good bowel sounds. ?Extremities: No cyanosis, clubbing, rash, edema ?Neuro: Alert & oriented x 3, cranial nerves grossly intact. Moves all 4  extremities w/o difficulty. Affect pleasant. ? ?Assessment/Plan: ?1.  CAD: Patient has history of CAD with prior PCI to OM in 2002.  NSTEMI in 3/23 with cath showing complex proximal LAD disease with serial 90% stenoses separated by small aneurysmal areas.  He had a 60% mid LCx stenosis.  RCA with mild disease.  He had DES x 2 to proximal and mid LAD. No chest pain.  ?- He will need to be on anticoagulation due to previous DVT/PE (apixaban), on Plavix for at least 6 months, ideally 1 year. Not on ASA.  ?- Myalgias with Crestor and atorvastatin. ?- Continue Livalo 4 mg daily.  Lipids/LFTs today. ?2. Chronic systolic CHF: Ischemic cardiomyopathy.  Echo in 3/23 with EF 20-25%, severe LV dilation, RV mildly dysfunctional.  RHC in 3/23 with markedly elevated filling pressures, CI 2.03, and PAPI < 1. He is not volume overloaded on exam. NYHA class  II.  ?- Continue torsemide 10 mg daily. BMET today. ?- Continue bisoprolol 5 mg daily (beta-1 selective with COPD).  ?- Continue Entresto 24/26 mg bid.   ?- Continue empagliflozin 10 mg daily.  ?- Continue spironolactone 25 mg daily.  ?- Repeat echo in 2 months. 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   ?- Continue apixaban. No bleeding issues.  ?4. COPD: He is back smoking, but trying to quit. ?5. Thrombocytopenia: Mild, chronic.  ?6. Bladder cancer: Has been followed by cystoscopies   ?- He has been referred to Dr. Alinda Money.  ? ?Follow up in 2 months with Dr. Aundra Dubin + echo as scheduled. ? ?Rafael Bihari FNP-BC ?12/08/2021 ? ?

## 2021-12-13 ENCOUNTER — Telehealth (HOSPITAL_COMMUNITY): Payer: Self-pay

## 2021-12-13 NOTE — Telephone Encounter (Signed)
Wrong number in chart, letter mailed to address on file  ?

## 2021-12-13 NOTE — Telephone Encounter (Signed)
-----   Message from Rafael Bihari, [place] sent at 12/08/2021  3:51 PM EDT ----- ?Lipids stable. Kidney function improved, but mildly elevated. T bili remains elevated, but improved from previous of 2.7. ? ?He needs to follow up with PCP regarding hyperbilirubinemia ? ?Please decrease torsemide to 10 mg every other day. Repeat BMET in 2 weeks. ?

## 2021-12-15 ENCOUNTER — Telehealth (HOSPITAL_COMMUNITY): Payer: Self-pay | Admitting: *Deleted

## 2021-12-15 ENCOUNTER — Telehealth (HOSPITAL_COMMUNITY): Payer: Self-pay

## 2021-12-15 DIAGNOSIS — I5022 Chronic systolic (congestive) heart failure: Secondary | ICD-10-CM

## 2021-12-15 MED ORDER — TORSEMIDE 20 MG PO TABS: 10.0000 mg | ORAL_TABLET | ORAL | 3 refills | Status: DC

## 2021-12-15 NOTE — Telephone Encounter (Signed)
Patient advised and verbalized understanding. Lab appt scheduled, lab order entered. Med list updated to reflect changes.   ? ?Meds ordered this encounter  ?Medications  ? torsemide (DEMADEX) 20 MG tablet  ?  Sig: Take 0.5 tablets (10 mg total) by mouth every other day. Take an extra half tablet PRN for weight gain/edema. (3lbs in 1 day or 5 lbs in 1 week)  ?  Dispense:  15 tablet  ?  Refill:  3  ?  Please cancel all previous orders for current medication. Change in dosage or pill size.  ? ?Orders Placed This Encounter  ?Procedures  ? Basic metabolic panel  ?  Standing Status:   Future  ?  Standing Expiration Date:   12/16/2022  ?  Order Specific Question:   Release to patient  ?  Answer:   Immediate  ? ? ? ?

## 2021-12-15 NOTE — Telephone Encounter (Signed)
Pt left vm requesting return call for lab results. I called pt back at [phone] no answer/no vm set up.  ?

## 2021-12-18 ENCOUNTER — Telehealth: Payer: Self-pay | Admitting: Dermatology

## 2021-12-18 NOTE — Telephone Encounter (Signed)
Notes documented and referral routed back to referring office. 

## 2021-12-18 NOTE — Telephone Encounter (Signed)
Patient is calling for a referral appointment from Angelica Pou, M.D.  Patient does not want to wait until  January 2024 for an appointment so would like referral sent back to Dr. Jimmye Norman' office. ?

## 2021-12-20 ENCOUNTER — Encounter: Payer: Self-pay | Admitting: Internal Medicine

## 2021-12-20 ENCOUNTER — Ambulatory Visit: Payer: Medicare Other | Admitting: Internal Medicine

## 2021-12-20 ENCOUNTER — Ambulatory Visit (INDEPENDENT_AMBULATORY_CARE_PROVIDER_SITE_OTHER): Payer: Medicare Other | Admitting: Internal Medicine

## 2021-12-20 ENCOUNTER — Other Ambulatory Visit: Payer: Self-pay

## 2021-12-20 VITALS — BP 101/55 | HR 74 | Temp 97.6°F | Ht 70.0 in | Wt 250.6 lb

## 2021-12-20 DIAGNOSIS — F17200 Nicotine dependence, unspecified, uncomplicated: Secondary | ICD-10-CM

## 2021-12-20 DIAGNOSIS — I255 Ischemic cardiomyopathy: Secondary | ICD-10-CM

## 2021-12-20 DIAGNOSIS — F17210 Nicotine dependence, cigarettes, uncomplicated: Secondary | ICD-10-CM | POA: Diagnosis not present

## 2021-12-20 DIAGNOSIS — D751 Secondary polycythemia: Secondary | ICD-10-CM

## 2021-12-20 NOTE — Assessment & Plan Note (Signed)
Patient with a history of Rosanna Randy syndrome coming in with concern about his elevated bilirubin levels. 2.3 on last CMP. No signs or symptoms of abdominal pain, cholelithiasis, dyspepsia, or upset stomach. No jaundice on exam. Provided reassurance to patient today. ?

## 2021-12-20 NOTE — Assessment & Plan Note (Signed)
Still smoking, working on quitting. Discussed to let us know if he changes his mind about medication assistance.  ?

## 2021-12-20 NOTE — Assessment & Plan Note (Addendum)
Patient concerned about his ongoing elevated hemoglobin. He is still smoking. Normal EPO levels, no leukocytosis or thrombocytosis, JAK2 levels drawn but not resulted. Oxygen saturation at rest 94, came up to 96 after ambulating. However patient is still smoking. Discussed possibility of sleep apnea with patient however he says he does not snore but he used to. The snoring stopped in 2019 but he doesn't know why. Suspect this elevated hemoglobin could be coming from chronic hypoxia however patient is very worried about his symptoms. ?- hematology referral.  ?- encourage smoking cessation ?

## 2021-12-20 NOTE — Progress Notes (Signed)
? ?  CC: abnormal labs ? ?HPI: ? ?[name] is a 76 y.o. PMH noted below, who presents to the [facility] with complaints of abnormal labs. To see the management of his acute and chronic conditions, please refer to the A&P note under the encounters tab.  ? ?Past Medical History:  ?Diagnosis Date  ? AICD (automatic cardioverter/defibrillator) present   ? a. 11/2014 s/p MDT DDMB 1D4 Evera MRI XT DR AICD (ser# [id number]).  ? Bladder cancer ([place])   ? a. 2013 s/p chemo/xrt.  ? CAD (coronary artery disease)   ? a. 11/2020 s/p MI/cardiac arrest-->DES to LCX x 1; b. 10/2011 Cath: LAD 30mw/ aneurysmal post-stenotic dilation-->med rx. LCX 50d to prior stent; c. 10/2021 NSTEMI/Cath: LM 30ost/m, LAD 90p/m, 974mLCX 20p/m, patent stent, LPAV 60, RCA mild diff dzs, EF<25%, LVEDP 4073m.  ? Chronic HFrEF (heart failure with reduced ejection fraction) ([place] ? a. EF prev as low as 20% w/ subsequent improvement to 50-55%; b. 11/2020 limited echo: "windows were challenging...LVEF largely preserved."; c. 10/2021 LV gram: EF <25%, LVEDP 23m57m  ? COPD (chronic obstructive pulmonary disease) (HCC)[place]? Depression   ? Diverticulitis   ? DVT (deep venous thrombosis) (HCC)[place]? Essential hypertension   ? Hyperlipidemia LDL goal <70   ? Ischemic cardiomyopathy   ? a. EF prev as low as 20%; b. 11/2014 s/p MDT [id number] MRI XT DR AICD; c. subsequent improvement in EF to 50-55%; d. 11/2020 limited echo: "windows were challenging...LVEF largely preserved."; e. 10/2021 LV Gram: EF <25%.  ? MI (myocardial infarction) (HCC)[place]? Migraines   ? Morbid obesity (HCC)[place]? 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  ? Thrombocytopenia (HCC)[place]? Tobacco abuse   ? Varicose veins of left lower extremity   ? ?Review of Systems:  positive for fatigue, negative for leg swelling, SHOB, chest pain ? ?Physical Exam: ?Gen: obese elderly man in NAD ?HEENT: normocephalic atraumatic, MMM ?CV: RRR, no m/r/g   ?Resp: CTAB, normal WOB  ?GI: soft,  nontender ?MSK: moves all extremities without difficulty ?Skin:warm and dry ?Neuro:alert answering questions appropriately ?Psych: normal affect ? ? ?Assessment & Plan:  ? ?See Encounters Tab for problem based charting. ? ?Patient discussed with Dr. HoffHeber [place]?

## 2021-12-20 NOTE — Patient Instructions (Signed)
Paul Bullock ? ?It was a pleasure seeing you in the clinic today.  ? ?We talked about your hemoglobin and your bilirubin levels. ? ?Hemoglobin- work on quitting smoking. You will also be contacted by the hematologist to schedule an appointment. ? ?Rosanna Randy syndrome- your bilirubin elevation is not something you need to worry about at this time.  ? ?Please call our clinic at [phone] if you have any questions or concerns. The best time to call is Monday-Friday from 9am-4pm, but there is someone available 24/7 at the same number. If you need medication refills, please notify your pharmacy one week in advance and they will send [place] a request. ?  ?Thank you for letting us take part in your care. We look forward to seeing you next time! ? ?

## 2021-12-21 NOTE — Progress Notes (Signed)
Internal Medicine Clinic Attending ° °Case discussed with Dr. DeMaio  At the time of the visit.  We reviewed the resident’s history and exam and pertinent patient test results.  I agree with the assessment, diagnosis, and plan of care documented in the resident’s note. ° ° °

## 2021-12-24 NOTE — Progress Notes (Signed)
Paul Bullock  [phone] [phone]  ID: Paul Bullock OB: Jul 24, 1946  MR#: [id number]  QAS#:[id number]  Patient Care Team: Lottie Mussel, MD as PCP - General (Internal Medicine) [name], MD as PCP - Cardiology (Cardiology)  CHIEF COMPLAINT: Polycythemia.  INTERVAL HISTORY: Patient is a 76 year old male who was noted to have a persistently elevated hemoglobin on routine blood work.  He currently feels well and is asymptomatic.  He has no neurologic complaints.  He denies any recent fevers or illnesses.  He has no new medications.  He has a good appetite and denies weight loss.  He has no chest pain, shortness of breath, cough, or hemoptysis.  He denies any nausea, vomiting, constipation, diarrhea.  He has no urinary complaints.  Patient feels at his baseline and offers no specific complaints today.  REVIEW OF SYSTEMS:   Review of Systems  Constitutional: Negative.  Negative for fever, malaise/fatigue and weight loss.  Respiratory: Negative.  Negative for cough and shortness of breath.   Cardiovascular: Negative.  Negative for chest pain and leg swelling.  Gastrointestinal: Negative.  Negative for abdominal pain.  Genitourinary: Negative.  Negative for dysuria.  Musculoskeletal: Negative.  Negative for back pain.  Skin: Negative.  Negative for rash.  Neurological: Negative.  Negative for dizziness, focal weakness, weakness and headaches.  Psychiatric/Behavioral: Negative.  The patient is not nervous/anxious.    As per HPI. Otherwise, a complete review of systems is negative.  PAST MEDICAL HISTORY: Past Medical History:  Diagnosis Date   AICD (automatic cardioverter/defibrillator) present    a. 11/2014 s/p MDT DDMB 1D4 [facility] MRI XT DR AICD (ser# [id number]).   Bladder cancer ([place])    a. 2013 s/p chemo/xrt.   CAD (coronary artery disease)    a. 11/2020 s/p MI/cardiac arrest-->DES to LCX x 1; b. 10/2011 Cath: LAD 56mw/ aneurysmal  post-stenotic dilation-->med rx. LCX 50d to prior stent; c. 10/2021 NSTEMI/Cath: LM 30ost/m, LAD 90p/m, 959mLCX 20p/m, patent stent, LPAV 60, RCA mild diff dzs, EF<25%, LVEDP 4027m.   Chronic HFrEF (heart failure with reduced ejection fraction) (HCC)    a. EF prev as low as 20% w/ subsequent improvement to 50-55%; b. 11/2020 limited echo: "windows were challenging...LVEF largely preserved."; c. 10/2021 LV gram: EF <25%, LVEDP 70m47m   COPD (chronic obstructive pulmonary disease) (HCC)    Depression    Diverticulitis    DVT (deep venous thrombosis) (HCC)[place] Essential hypertension    Hyperlipidemia LDL goal <70    Ischemic cardiomyopathy    a. EF prev as low as 20%; b. 11/2014 s/p MDT [id number] MRI XT DR AICD; c. subsequent improvement in EF to 50-55%; d. 11/2020 limited echo: "windows were challenging...LVEF largely preserved."; e. 10/2021 LV Gram: EF <25%.   MI (myocardial infarction) (HCC)[place] Migraines    Morbid obesity (HCC)[place] Pulmonary embolism (unprovoked)    a. x 2 -->chronic eliquis.   Thrombocytopenia (HCC)[place] Tobacco abuse    Varicose veins of left lower extremity     PAST SURGICAL HISTORY: Past Surgical History:  Procedure Laterality Date   CARDIAC DEFIBRILLATOR PLACEMENT     CORONARY STENT INTERVENTION N/A 10/18/2021   Procedure: CORONARY STENT INTERVENTION;  Surgeon: AridWellington Hampshire;  [facility];  Service: Cardiovascular;  Laterality: N/A;  Prox and Mid LAD   MOHS SURGERY     PERCUTANEOUS CORONARY STENT INTERVENTION (PCI-S)     RIGHT/LEFT HEART  CATH AND CORONARY ANGIOGRAPHY N/A 10/13/2021   Procedure: RIGHT/LEFT HEART CATH AND CORONARY ANGIOGRAPHY;  Surgeon: [name], MD;  Location: [facility];  Service: Cardiovascular;  Laterality: N/A;   VASECTOMY     VASECTOMY REVERSAL      FAMILY HISTORY: Family History  Problem Relation Age of Onset   Stroke Mother    Bladder Cancer Father    Heart attack Brother     ADVANCED  DIRECTIVES (Y/N):  N  HEALTH MAINTENANCE: Social History   Tobacco Use   Smoking status: Every Day    Packs/day: 0.75    Years: 55.00    Pack years: 41.25    Types: Cigarettes   Smokeless tobacco: Never   Tobacco comments:    10 cigs per day  Substance Use Topics   Alcohol use: Never   Drug use: Never     Colonoscopy:  PAP:  Bone density:  Lipid panel:  Allergies  Allergen Reactions   Atorvastatin Other (See Comments)    Myalgias   Rosuvastatin Other (See Comments)    myalgias    Current Outpatient Medications  Medication Sig Dispense Refill   albuterol (PROVENTIL) (2.5 MG/3ML) 0.083% nebulizer solution Take 2.5 mg by nebulization every 6 (six) hours as needed for wheezing or shortness of breath.     apixaban (ELIQUIS) 2.5 MG TABS tablet Take 1 tablet (2.5 mg total) by mouth 2 (two) times daily. 180 tablet 3   bisoprolol (ZEBETA) 5 MG tablet Take 1 tablet (5 mg total) by mouth daily. 90 tablet 3   Cholecalciferol (VITAMIN D-3) 125 MCG (5000 UT) TABS Take 5,000 Units by mouth daily.     clopidogrel (PLAVIX) 75 MG tablet Take 1 tablet (75 mg total) by mouth daily. 90 tablet 3   Cyanocobalamin 2500 MCG TABS Take 2,500 mcg by mouth daily.     empagliflozin (JARDIANCE) 10 MG TABS tablet Take 1 tablet (10 mg total) by mouth daily. 90 tablet 3   fluticasone (FLONASE) 50 MCG/ACT nasal spray Place 2 sprays into both nostrils daily.     fluticasone furoate-vilanterol (BREO ELLIPTA) 100-25 MCG/ACT AEPB Inhale 1 puff into the lungs in the morning.     Multiple Vitamins-Minerals (MULTIVITAMIN WITH MINERALS) tablet Take 1 tablet by mouth daily.     nitroGLYCERIN (NITROSTAT) 0.4 MG SL tablet Place 1 tablet (0.4 mg total) under the tongue every 5 (five) minutes as needed for chest pain. 25 tablet 3   nystatin (MYCOSTATIN) 100000 UNIT/ML suspension Take 2 mLs (200,000 Units total) by mouth 4 (four) times daily. 473 mL 0   Pitavastatin Calcium (LIVALO) 4 MG TABS Take 1 tablet (4 mg total)  by mouth daily. 90 tablet 3   sacubitril-valsartan (ENTRESTO) 24-26 MG Take 1 tablet by mouth 2 (two) times daily. 180 tablet 3   spironolactone (ALDACTONE) 25 MG tablet Take 1 tablet (25 mg total) by mouth daily. 90 tablet 3   torsemide (DEMADEX) 20 MG tablet Take 0.5 tablets (10 mg total) by mouth every other day. Take an extra half tablet PRN for weight gain/edema. (3lbs in 1 day or 5 lbs in 1 week) 15 tablet 3   No current facility-administered medications for this visit.    OBJECTIVE: Vitals:   12/28/21 1456  BP: (!) 97/58  Pulse: 77  Resp: 18  Temp: (!) 96.7 F (35.9 C)     Body mass index is 35.46 kg/m.    ECOG FS:0 - Asymptomatic  General: Well-developed, well-nourished, no acute distress.  Eyes: Pink conjunctiva, anicteric sclera. HEENT: Normocephalic, moist mucous membranes. Lungs: No audible wheezing or coughing. Heart: Regular rate and rhythm. Abdomen: Soft, nontender, no obvious distention. Musculoskeletal: No edema, cyanosis, or clubbing. Neuro: Alert, answering all questions appropriately. Cranial nerves grossly intact. Skin: No rashes or petechiae noted. Psych: Normal affect. Lymphatics: No cervical, calvicular, axillary or inguinal LAD.   LAB RESULTS:  Lab Results  Component Value Date   NA 134 (L) 12/28/2021   K 4.6 12/28/2021   CL 102 12/28/2021   CO2 23 12/28/2021   GLUCOSE 115 (H) 12/28/2021   BUN 21 12/28/2021   CREATININE 1.44 (H) 12/28/2021   CALCIUM 9.2 12/28/2021   PROT 7.3 12/28/2021   ALBUMIN 4.0 12/28/2021   AST 20 12/28/2021   ALT 19 12/28/2021   ALKPHOS 69 12/28/2021   BILITOT 2.1 (H) 12/28/2021   GFRNONAA 51 (L) 12/28/2021    Lab Results  Component Value Date   WBC 6.7 12/28/2021   NEUTROABS 12.2 (H) 10/14/2021   HGB 17.9 (H) 12/28/2021   HCT 51.5 12/28/2021   MCV 92.8 12/28/2021   PLT 123 (L) 12/28/2021     STUDIES: No results found.  ASSESSMENT: Polycythemia  PLAN:    Polycythemia: Patient's hemoglobin remains  persistently elevated at 17.9.  He has a mildly increased iron saturation ratio, but the remainder of his iron panel is within normal limits.  Erythropoietin, JAK2 mutation, and carbon monoxide level are all pending at time of dictation.  Suspect his polycythemia may be secondary from chronic tobacco use.  No intervention is needed at this time, but patient may require phlebotomy in the future.  He will have a video assisted telemedicine visit in approximately 3 weeks to discuss the results. Thrombocytopenia: Chronic and unchanged.  Patient's platelet count is 123 today.  Monitor. Hyperbilirubinemia: Patient's total bilirubin is 2.1 today.  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 Renal insufficiency: Patient's creatinine is mildly elevated today at 1.44, monitor.  Patient expressed understanding and was in agreement with this plan. He also understands that He can call clinic at any time with any questions, concerns, or complaints.    Paul Huger, MD   12/29/2021 9:56 AM

## 2021-12-28 ENCOUNTER — Inpatient Hospital Stay: Payer: Medicare Other | Attending: Oncology | Admitting: Oncology

## 2021-12-28 ENCOUNTER — Inpatient Hospital Stay: Payer: Medicare Other

## 2021-12-28 VITALS — BP 97/58 | HR 77 | Temp 96.7°F | Resp 18 | Wt 247.1 lb

## 2021-12-28 DIAGNOSIS — Z7901 Long term (current) use of anticoagulants: Secondary | ICD-10-CM | POA: Insufficient documentation

## 2021-12-28 DIAGNOSIS — Z86718 Personal history of other venous thrombosis and embolism: Secondary | ICD-10-CM | POA: Diagnosis not present

## 2021-12-28 DIAGNOSIS — Z7984 Long term (current) use of oral hypoglycemic drugs: Secondary | ICD-10-CM | POA: Insufficient documentation

## 2021-12-28 DIAGNOSIS — R718 Other abnormality of red blood cells: Secondary | ICD-10-CM

## 2021-12-28 DIAGNOSIS — Z79899 Other long term (current) drug therapy: Secondary | ICD-10-CM | POA: Insufficient documentation

## 2021-12-28 DIAGNOSIS — D751 Secondary polycythemia: Secondary | ICD-10-CM

## 2021-12-28 DIAGNOSIS — Z86711 Personal history of pulmonary embolism: Secondary | ICD-10-CM | POA: Insufficient documentation

## 2021-12-28 DIAGNOSIS — Z7951 Long term (current) use of inhaled steroids: Secondary | ICD-10-CM | POA: Insufficient documentation

## 2021-12-28 DIAGNOSIS — N289 Disorder of kidney and ureter, unspecified: Secondary | ICD-10-CM | POA: Insufficient documentation

## 2021-12-28 DIAGNOSIS — F17210 Nicotine dependence, cigarettes, uncomplicated: Secondary | ICD-10-CM | POA: Diagnosis not present

## 2021-12-28 DIAGNOSIS — D696 Thrombocytopenia, unspecified: Secondary | ICD-10-CM | POA: Insufficient documentation

## 2021-12-28 LAB — COMPREHENSIVE METABOLIC PANEL
ALT: 19 U/L (ref 0–44)
AST: 20 U/L (ref 15–41)
Albumin: 4 g/dL (ref 3.5–5.0)
Alkaline Phosphatase: 69 U/L (ref 38–126)
Anion gap: 9 (ref 5–15)
BUN: 21 mg/dL (ref 8–23)
CO2: 23 mmol/L (ref 22–32)
Calcium: 9.2 mg/dL (ref 8.9–10.3)
Chloride: 102 mmol/L (ref 98–111)
Creatinine, Ser: 1.44 mg/dL — ABNORMAL HIGH (ref 0.61–1.24)
GFR, Estimated: 51 mL/min — ABNORMAL LOW (ref >60)
Glucose, Bld: 115 mg/dL — ABNORMAL HIGH (ref 70–99)
Potassium: 4.6 mmol/L (ref 3.5–5.1)
Sodium: 134 mmol/L — ABNORMAL LOW (ref 135–145)
Total Bilirubin: 2.1 mg/dL — ABNORMAL HIGH (ref 0.3–1.2)
Total Protein: 7.3 g/dL (ref 6.5–8.1)

## 2021-12-28 LAB — IRON AND TIBC
Iron: 165 ug/dL (ref 45–182)
Saturation Ratios: 49 % — ABNORMAL HIGH (ref 17.9–39.5)
TIBC: 340 ug/dL (ref 250–450)
UIBC: 175 ug/dL

## 2021-12-28 LAB — CBC
HCT: 51.5 % (ref 39.0–52.0)
Hemoglobin: 17.9 g/dL — ABNORMAL HIGH (ref 13.0–17.0)
MCH: 32.3 pg (ref 26.0–34.0)
MCHC: 34.8 g/dL (ref 30.0–36.0)
MCV: 92.8 fL (ref 80.0–100.0)
Platelets: 123 10*3/uL — ABNORMAL LOW (ref 150–400)
RBC: 5.55 MIL/uL (ref 4.22–5.81)
RDW: 13 % (ref 11.5–15.5)
WBC: 6.7 10*3/uL (ref 4.0–10.5)
nRBC: 0 % (ref 0.0–0.2)

## 2021-12-28 LAB — FERRITIN: Ferritin: 98 ng/mL (ref 24–336)

## 2021-12-28 NOTE — Progress Notes (Signed)
Patient here today for initial evaluation regarding abnormal labs. Patient denies any concerns.

## 2021-12-29 ENCOUNTER — Encounter (HOSPITAL_COMMUNITY): Payer: Self-pay

## 2021-12-29 ENCOUNTER — Other Ambulatory Visit (HOSPITAL_COMMUNITY): Payer: Medicare Other

## 2021-12-29 LAB — CARBON MONOXIDE, BLOOD (PERFORMED AT REF LAB): Carbon Monoxide, Blood: 4.1 % — ABNORMAL HIGH (ref 0.0–3.6)

## 2021-12-30 LAB — ERYTHROPOIETIN: Erythropoietin: 12.8 m[IU]/mL (ref 2.6–18.5)

## 2022-01-02 LAB — JAK2 GENOTYPR

## 2022-01-04 NOTE — Progress Notes (Signed)
Internal Medicine Clinic Attending  Case discussed with the resident at the time of the visit.  We reviewed the resident's history and exam and pertinent patient test results.  I agree with the assessment, diagnosis, and plan of care documented in the resident's note.  

## 2022-01-10 ENCOUNTER — Other Ambulatory Visit (HOSPITAL_COMMUNITY): Payer: Self-pay

## 2022-01-10 NOTE — Telephone Encounter (Signed)
Patient called wanting his inhaler and nebulizer refilled. I informed him that he will need to get those refilled through his pcp

## 2022-01-12 ENCOUNTER — Other Ambulatory Visit: Payer: Self-pay

## 2022-01-12 MED ORDER — FLUTICASONE FUROATE-VILANTEROL 100-25 MCG/ACT IN AEPB: 1.0000 | INHALATION_SPRAY | Freq: Every morning | RESPIRATORY_TRACT | 3 refills | Status: DC

## 2022-01-12 MED ORDER — ALBUTEROL SULFATE (2.5 MG/3ML) 0.083% IN NEBU
2.5000 mg | INHALATION_SOLUTION | Freq: Four times a day (QID) | RESPIRATORY_TRACT | 3 refills | Status: DC | PRN
Start: 2022-01-12 — End: 2022-01-15

## 2022-01-12 NOTE — Telephone Encounter (Signed)
fluticasone furoate-vilanterol (BREO ELLIPTA) 100-25 MCG/ACT AEPB, REFILL REQUEST FOR 90 DAYS SUPPLY.   albuterol (PROVENTIL) (2.5 MG/3ML) 0.083% nebulizer solution, would like the doctor to write the Rx for 574m. Please call pt back.   [facility]

## 2022-01-15 ENCOUNTER — Other Ambulatory Visit: Payer: Self-pay | Admitting: *Deleted

## 2022-01-15 MED ORDER — ALBUTEROL SULFATE (2.5 MG/3ML) 0.083% IN NEBU
2.5000 mg | INHALATION_SOLUTION | Freq: Four times a day (QID) | RESPIRATORY_TRACT | 3 refills | Status: DC | PRN
Start: 2022-01-15 — End: 2022-01-16

## 2022-01-15 MED ORDER — FLUTICASONE FUROATE-VILANTEROL 100-25 MCG/ACT IN AEPB: 1.0000 | INHALATION_SPRAY | Freq: Every morning | RESPIRATORY_TRACT | 3 refills | Status: DC

## 2022-01-15 NOTE — Telephone Encounter (Addendum)
Need the Albuterol and Breo sent to the [facility].  Call to Pharmacy said had not received.

## 2022-01-15 NOTE — Telephone Encounter (Addendum)
Call from patient requesting 3 months supplies on his medications.  The cost is the same for 90 days supplies.  Would like to get the Breo 3 units for 90 days.  Albuterol requesting 540 mil for a 90 days supply with 3 refills.

## 2022-01-16 MED ORDER — FLUTICASONE FUROATE-VILANTEROL 100-25 MCG/ACT IN AEPB
1.0000 | INHALATION_SPRAY | Freq: Every morning | RESPIRATORY_TRACT | 3 refills | Status: AC
Start: 2022-01-16 — End: 2022-04-16

## 2022-01-16 MED ORDER — ALBUTEROL SULFATE (2.5 MG/3ML) 0.083% IN NEBU
2.5000 mg | INHALATION_SOLUTION | Freq: Four times a day (QID) | RESPIRATORY_TRACT | 3 refills | Status: DC | PRN
Start: 2022-01-16 — End: 2022-09-05

## 2022-01-16 NOTE — Progress Notes (Signed)
[facility]  [phone] [phone]) [phone]  ID: Kalin Kyler OB: 05-Dec-1945  MR#: [id number]  GYI#:[id number]  Patient Care Team: Lottie Mussel, MD as PCP - General (Internal Medicine) [name], MD as PCP - Cardiology (Cardiology)  I connected with Kasandra Knudsen on 01/19/22 at  3:30 PM EDT by video enabled telemedicine visit and verified that I am speaking with the correct person using two identifiers.   I discussed the limitations, risks, security and privacy concerns of performing an evaluation and management service by telemedicine and the availability of in-person appointments. I also discussed with the patient that there may be a patient responsible charge related to this service. The patient expressed understanding and agreed to proceed.   Other persons participating in the visit and their role in the encounter: Patient, MD.  Patient's location: Home. Provider's location: Clinic.   CHIEF COMPLAINT: Polycythemia.  INTERVAL HISTORY: Patient agreed to video assisted telemedicine visit for further evaluation and discussion of his laboratory results.  He continues to feel well and remains asymptomatic.  He has no neurologic complaints.  He denies any recent fevers or illnesses.  He has no new medications.  He has a good appetite and denies weight loss.  He has no chest pain, shortness of breath, cough, or hemoptysis.  He denies any nausea, vomiting, constipation, diarrhea.  He has no urinary complaints.  Patient offers no specific complaints today.  REVIEW OF SYSTEMS:   Review of Systems  Constitutional: Negative.  Negative for fever, malaise/fatigue and weight loss.  Respiratory: Negative.  Negative for cough and shortness of breath.   Cardiovascular: Negative.  Negative for chest pain and leg swelling.  Gastrointestinal: Negative.  Negative for abdominal pain.  Genitourinary: Negative.  Negative for dysuria.  Musculoskeletal: Negative.   Negative for back pain.  Skin: Negative.  Negative for rash.  Neurological: Negative.  Negative for dizziness, focal weakness, weakness and headaches.  Psychiatric/Behavioral: Negative.  The patient is not nervous/anxious.     As per HPI. Otherwise, a complete review of systems is negative.  PAST MEDICAL HISTORY: Past Medical History:  Diagnosis Date   AICD (automatic cardioverter/defibrillator) present    a. 11/2014 s/p MDT DDMB 1D4 [facility] MRI XT DR AICD (ser# [id number]).   Bladder cancer ([place])    a. 2013 s/p chemo/xrt.   CAD (coronary artery disease)    a. 11/2020 s/p MI/cardiac arrest-->DES to LCX x 1; b. 10/2011 Cath: LAD 63mw/ aneurysmal post-stenotic dilation-->med rx. LCX 50d to prior stent; c. 10/2021 NSTEMI/Cath: LM 30ost/m, LAD 90p/m, 939mLCX 20p/m, patent stent, LPAV 60, RCA mild diff dzs, EF<25%, LVEDP 4033m.   Chronic HFrEF (heart failure with reduced ejection fraction) (HCC)    a. EF prev as low as 20% w/ subsequent improvement to 50-55%; b. 11/2020 limited echo: "windows were challenging...LVEF largely preserved."; c. 10/2021 LV gram: EF <25%, LVEDP 38m71m   COPD (chronic obstructive pulmonary disease) (HCC)    Depression    Diverticulitis    DVT (deep venous thrombosis) (HCC)[place] Essential hypertension    Hyperlipidemia LDL goal <70    Ischemic cardiomyopathy    a. EF prev as low as 20%; b. 11/2014 s/p MDT [id number] MRI XT DR AICD; c. subsequent improvement in EF to 50-55%; d. 11/2020 limited echo: "windows were challenging...LVEF largely preserved."; e. 10/2021 LV Gram: EF <25%.   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   Thrombocytopenia ([place])    Tobacco abuse    Varicose veins of left lower extremity     PAST SURGICAL HISTORY: Past Surgical History:  Procedure Laterality Date   CARDIAC DEFIBRILLATOR PLACEMENT     CORONARY STENT INTERVENTION N/A 10/18/2021    Procedure: CORONARY STENT INTERVENTION;  Surgeon: [name], MD;  Location: [facility];  Service: Cardiovascular;  Laterality: N/A;  Prox and Mid LAD   MOHS SURGERY     PERCUTANEOUS CORONARY STENT INTERVENTION (PCI-S)     RIGHT/LEFT HEART CATH AND CORONARY ANGIOGRAPHY N/A 10/13/2021   Procedure: RIGHT/LEFT HEART CATH AND CORONARY ANGIOGRAPHY;  Surgeon: [name], MD;  Location: [facility];  Service: Cardiovascular;  Laterality: N/A;   VASECTOMY     VASECTOMY REVERSAL      FAMILY HISTORY: Family History  Problem Relation Age of Onset   Stroke Mother    Bladder Cancer Father    Heart attack Brother     ADVANCED DIRECTIVES (Y/N):  N  HEALTH MAINTENANCE: Social History   Tobacco Use   Smoking status: Every Day    Packs/day: 0.75    Years: 55.00    Total pack years: 41.25    Types: Cigarettes   Smokeless tobacco: Never   Tobacco comments:    10 cigs per day  Substance Use Topics   Alcohol use: Never   Drug use: Never     Colonoscopy:  PAP:  Bone density:  Lipid panel:  Allergies  Allergen Reactions   Atorvastatin Other (See Comments)    Myalgias   Rosuvastatin Other (See Comments)    myalgias    Current Outpatient Medications  Medication Sig Dispense Refill   albuterol (PROVENTIL) (2.5 MG/3ML) 0.083% nebulizer solution Take 3 mLs (2.5 mg total) by nebulization every 6 (six) hours as needed for wheezing or shortness of breath. 540 mL 3   apixaban (ELIQUIS) 2.5 MG TABS tablet Take 1 tablet (2.5 mg total) by mouth 2 (two) times daily. 180 tablet 3   bisoprolol (ZEBETA) 5 MG tablet Take 1 tablet (5 mg total) by mouth daily. 90 tablet 3   Cholecalciferol (VITAMIN D-3) 125 MCG (5000 UT) TABS Take 5,000 Units by mouth daily.     clopidogrel (PLAVIX) 75 MG tablet Take 1 tablet (75 mg total) by mouth daily. 90 tablet 3   Cyanocobalamin 2500 MCG TABS Take 2,500 mcg by mouth daily.     empagliflozin (JARDIANCE) 10 MG TABS tablet Take 1 tablet  (10 mg total) by mouth daily. 90 tablet 3   fluticasone (FLONASE) 50 MCG/ACT nasal spray Place 2 sprays into both nostrils daily.     fluticasone furoate-vilanterol (BREO ELLIPTA) 100-25 MCG/ACT AEPB Inhale 1 puff into the lungs in the morning. 3 each 3   Multiple Vitamins-Minerals (MULTIVITAMIN WITH MINERALS) tablet Take 1 tablet by mouth daily.     nitroGLYCERIN (NITROSTAT) 0.4 MG SL tablet Place 1 tablet (0.4 mg total) under the tongue every 5 (five) minutes as needed for chest pain. 25 tablet 3   nystatin (MYCOSTATIN) 100000 UNIT/ML suspension Take 2 mLs (200,000 Units total) by mouth 4 (four) times daily. 473 mL 0   Pitavastatin Calcium (LIVALO) 4 MG TABS Take 1 tablet (4 mg total) by mouth daily. 90 tablet 3   sacubitril-valsartan (ENTRESTO) 24-26 MG Take 1 tablet by mouth 2 (two) times daily. 180 tablet 3   spironolactone (ALDACTONE) 25 MG tablet Take 1 tablet (25 mg total)  by mouth daily. 90 tablet 3   torsemide (DEMADEX) 20 MG tablet Take 0.5 tablets (10 mg total) by mouth every other day. Take an extra half tablet PRN for weight gain/edema. (3lbs in 1 day or 5 lbs in 1 week) 15 tablet 3   No current facility-administered medications for this visit.    OBJECTIVE: There were no vitals filed for this visit.    There is no height or weight on file to calculate BMI.    ECOG FS:0 - Asymptomatic  General: Well-developed, well-nourished, no acute distress. HEENT: Normocephalic. Neuro: Alert, answering all questions appropriately. Cranial nerves grossly intact. Psych: Normal affect.   LAB RESULTS:  Lab Results  Component Value Date   NA 134 (L) 12/28/2021   K 4.6 12/28/2021   CL 102 12/28/2021   CO2 23 12/28/2021   GLUCOSE 115 (H) 12/28/2021   BUN 21 12/28/2021   CREATININE 1.44 (H) 12/28/2021   CALCIUM 9.2 12/28/2021   PROT 7.3 12/28/2021   ALBUMIN 4.0 12/28/2021   AST 20 12/28/2021   ALT 19 12/28/2021   ALKPHOS 69 12/28/2021   BILITOT 2.1 (H) 12/28/2021   GFRNONAA 51 (L)  12/28/2021    Lab Results  Component Value Date   WBC 6.7 12/28/2021   NEUTROABS 12.2 (H) 10/14/2021   HGB 17.9 (H) 12/28/2021   HCT 51.5 12/28/2021   MCV 92.8 12/28/2021   PLT 123 (L) 12/28/2021     STUDIES: No results found.  ASSESSMENT: Polycythemia  PLAN:    Polycythemia: Patient's hemoglobin remains persistently elevated at 17.9.  He has a mildly increased iron saturation ratio, but the remainder of his iron panel is within normal limits.  Erythropoietin and JAK2 mutation are negative.  Carbon monoxide level is mildly elevated and may be contributing to his polycythemia.  Patient states he has a plan to quit smoking in the near future.  He does not require phlebotomy at this time, but may need some in the future.  Have recommended he look into possibly going to the [facility] to donate blood.  No intervention is needed at this time.  Please refer patient back if there are any questions or concerns., Thrombocytopenia: Chronic and unchanged.  Patient's platelet count is 123 today.  Monitor. Hyperbilirubinemia: Patient's total bilirubin is 2.1 today.  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 Renal insufficiency: Patient's creatinine is mildly elevated today at 1.44, monitor.  I provided 20 minutes of face-to-face video visit time during this encounter which included chart review, counseling, and coordination of care as documented above.   Patient expressed understanding and was in agreement with this plan. He also understands that He can call clinic at any time with any questions, concerns, or complaints.   I provided 20 minutes of face-to-face video visit time during this encounter which included chart review, counseling, and coordination of care as documented above.    Lloyd Huger, MD   01/19/2022 11:07 AM

## 2022-01-16 NOTE — Addendum Note (Signed)
Addended by: Randalyn Rhea on: 01/16/2022 08:31 AM   Modules accepted: Orders

## 2022-01-18 ENCOUNTER — Inpatient Hospital Stay: Payer: Medicare Other | Attending: Oncology | Admitting: Oncology

## 2022-01-18 DIAGNOSIS — D751 Secondary polycythemia: Secondary | ICD-10-CM | POA: Diagnosis not present

## 2022-02-02 ENCOUNTER — Telehealth (HOSPITAL_COMMUNITY): Payer: Self-pay | Admitting: *Deleted

## 2022-02-02 NOTE — Telephone Encounter (Signed)
Pt called back, he is aware and will take extra Torsemide today, will let us know if that does not help

## 2022-02-02 NOTE — Telephone Encounter (Signed)
Pt left vm stating he had a 3.4lb weight gain overnight. I called pt back to advise to take his additional prn diuretic. I called [phone] the only number listed for pt and was told I had the wrong number.

## 2022-02-21 ENCOUNTER — Ambulatory Visit (INDEPENDENT_AMBULATORY_CARE_PROVIDER_SITE_OTHER): Payer: Medicare Other

## 2022-02-21 DIAGNOSIS — Z95810 Presence of automatic (implantable) cardiac defibrillator: Secondary | ICD-10-CM

## 2022-02-21 DIAGNOSIS — I255 Ischemic cardiomyopathy: Secondary | ICD-10-CM | POA: Diagnosis not present

## 2022-02-23 ENCOUNTER — Telehealth: Payer: Self-pay

## 2022-02-23 NOTE — Telephone Encounter (Signed)
Patient called in stating he is getting a new monitor and he is going out of town on 02/26/2022. He states that if he does not receive it by then then he cannot bring the monitor with him and he will be back in September

## 2022-02-26 ENCOUNTER — Ambulatory Visit (HOSPITAL_COMMUNITY)
Admission: RE | Admit: 2022-02-26 | Discharge: 2022-02-26 | Disposition: A | Discharge status: Home / Self Care | Payer: Medicare Other | Source: Ambulatory Visit | Attending: Cardiology | Admitting: Cardiology

## 2022-02-26 ENCOUNTER — Encounter (HOSPITAL_COMMUNITY): Payer: Self-pay | Admitting: Cardiology

## 2022-02-26 VITALS — BP 116/56 | HR 86 | Wt 246.4 lb

## 2022-02-26 DIAGNOSIS — I5022 Chronic systolic (congestive) heart failure: Secondary | ICD-10-CM | POA: Diagnosis present

## 2022-02-26 LAB — BASIC METABOLIC PANEL
Anion gap: 10 (ref 5–15)
BUN: 27 mg/dL — ABNORMAL HIGH (ref 8–23)
CO2: 23 mmol/L (ref 22–32)
Calcium: 9.6 mg/dL (ref 8.9–10.3)
Chloride: 108 mmol/L (ref 98–111)
Creatinine, Ser: 1.64 mg/dL — ABNORMAL HIGH (ref 0.61–1.24)
GFR, Estimated: 44 mL/min — ABNORMAL LOW (ref >60)
Glucose, Bld: 125 mg/dL — ABNORMAL HIGH (ref 70–99)
Potassium: 4.3 mmol/L (ref 3.5–5.1)
Sodium: 141 mmol/L (ref 135–145)

## 2022-02-26 LAB — BRAIN NATRIURETIC PEPTIDE: B Natriuretic Peptide: 175.8 pg/mL — ABNORMAL HIGH (ref 0.0–100.0)

## 2022-02-26 MED ORDER — ENTRESTO 49-51 MG PO TABS: 1.0000 | ORAL_TABLET | Freq: Two times a day (BID) | ORAL | 3 refills | Status: DC

## 2022-02-26 NOTE — Patient Instructions (Signed)
Increase your Delene Loll to 49/51 Twice daily (can take 2 of the 24/26s till they run out )   Labs done today, your results will be available in MyChart, we will contact you for abnormal readings.  Repeat blood work in 10 days.  Please follow up with our heart failure pharmacist in 3 weeks  Your physician recommends that you schedule a follow-up appointment in: 2 monhs  If you have any questions or concerns before your next appointment please send [place] a message through [place] or call our office at [phone].    TO LEAVE A MESSAGE FOR THE NURSE SELECT OPTION 2, PLEASE LEAVE A MESSAGE INCLUDING: YOUR NAME DATE OF BIRTH CALL BACK NUMBER REASON FOR CALL**this is important as we prioritize the call backs  YOU WILL RECEIVE A CALL BACK THE SAME DAY AS LONG AS YOU CALL BEFORE 4:00 PM  At the [facility], you and your health needs are our priority. As part of our continuing mission to provide you with exceptional heart care, we have created designated Provider Care Teams. These Care Teams include your primary Cardiologist (physician) and Advanced Practice Providers (APPs- Physician Assistants and Nurse Practitioners) who all work together to provide you with the care you need, when you need it.   You may see any of the following providers on your designated Care Team at your next follow up: Dr Glori Bickers Dr Haynes Kerns, NP Lyda Jester, [place] [facility] [place], [place] Audry Riles, PharmD   Please be sure to bring in all your medications bottles to every appointment.

## 2022-02-27 LAB — CUP PACEART REMOTE DEVICE CHECK
Battery Remaining Longevity: 34 mo
Battery Voltage: 2.95 V
Brady Statistic AP VP Percent: 0.01 %
Brady Statistic AP VS Percent: 8.81 %
Brady Statistic AS VP Percent: 0.03 %
Brady Statistic AS VS Percent: 91.15 %
Brady Statistic RA Percent Paced: 8.75 %
Brady Statistic RV Percent Paced: 0.04 %
Date Time Interrogation Session: 20230724031707
HighPow Impedance: 77 Ohm
Implantable Lead Implant Date: 20160412
Implantable Lead Implant Date: 20160412
Implantable Lead Location: 753859
Implantable Lead Location: 753860
Implantable Lead Model: 5076
Implantable Lead Model: 6935
Implantable Pulse Generator Implant Date: 20160412
Lead Channel Impedance Value: 304 Ohm
Lead Channel Impedance Value: 399 Ohm
Lead Channel Impedance Value: 456 Ohm
Lead Channel Pacing Threshold Amplitude: 0.625 V
Lead Channel Pacing Threshold Amplitude: 2.25 V
Lead Channel Pacing Threshold Pulse Width: 0.4 ms
Lead Channel Pacing Threshold Pulse Width: 0.4 ms
Lead Channel Sensing Intrinsic Amplitude: 3.75 mV
Lead Channel Sensing Intrinsic Amplitude: 3.75 mV
Lead Channel Sensing Intrinsic Amplitude: 4.125 mV
Lead Channel Sensing Intrinsic Amplitude: 4.125 mV
Lead Channel Setting Pacing Amplitude: 1.5 V
Lead Channel Setting Pacing Amplitude: 2.5 V
Lead Channel Setting Pacing Pulse Width: 1 ms
Lead Channel Setting Sensing Sensitivity: 0.3 mV

## 2022-02-27 NOTE — Progress Notes (Signed)
PCP: Lottie Mussel, MD Cardiology: Dr. Aundra Dubin  76 y.o. with history of COPD, DVT/PE, ischemic cardiomyopathy, and CAD presents for followup of CHF.  Patient suffered a myocardial infarction complicated by cardiac arrest in 2002 and required stenting of the obtuse marginal.  He notes that he subsequently had LV dysfunction.  In 2013, he required repeat diagnostic catheterization which revealed moderate disease distal to previously placed obtuse marginal stent with a 75% stenosis in the mid LAD with aneurysmal post stenotic dilation.  Per notes, he was medically managed.  In the setting of LV dysfunction with an EF as low as 20%, he underwent Medtronic AICD placement in April 2016, and up until 2022, he was followed closely by cardiology in [place], [place].  He did require admission to a hospital in [facility] in June 2002, in the setting of COPD exacerbation while visiting his son.  Per discharge summary, he was noted to have mild troponin elevation which was felt to be secondary to demand ischemia.   Mr. Hewins moved to [place] in December 2022 and has been living locally.  His ex-wife lives nearby and they are close friends.  On October 12 2021, he developed recurrent chest discomfort and was admitted to [facility] with NSTEMI.  He underwent diagnostic catheterization at [facility] regional revealing severe proximal to mid LAD disease with otherwise nonobstructive disease and patent obtuse marginal stent.  His LVEDP was approximately 40 mmHg and ventriculogram showed an EF of less than 25%.  He was given Lasix in the lab and after discussion with advanced heart failure team at [facility], he was transferred for further evaluation and potential inotropic support.  At [facility], he was diuresed and started on GDMT.  He had DES x 2 to proximal and mid LAD and was discharged home.   Patient has quit smoking using nicotine patches.  Weight is stable.  He is short of breath after walking up and down his  stairs 3-4 times in a row.  No dyspnea walking on flat ground.  No chest pain.  Did not want to do cardiac rehab but trying to exercise on his own.   ECG (personally reviewed): NSR, IVCD 132 msec  Medtronic ICD: stable thoracic impedance. No VT/AF.   Labs (3/23): K 4.6, creatinine 1.02, LDL 59, hgb 17.7, plts 98K Labs (5/23): K 4.6, creatinine 1.44, LDL 56, JAK2 mutation negative, hgb 17.9, plts 123  PMH: 1. COPD: Quit smoking in 3/23.  2. DVT/PE 3. Chronic systolic CHF: Ischemic cardiomyopathy.  Medtronic ICD from 4/16.  - Echo (3/23): EF 20-25% - RHC (3/23): mean RA 25, PA 62/47, mean PCWP 39, CI 2.03.  4. CAD: MI with OM2 stent in 2002.  - NSTEMI 3/23 with DES x 2 to proximal and mid LAD.  5. 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  6. Bladder cancer 2012: Chemoradiation.  7. Type 2 DM 8. Polycythemia: Likely due to chronic smoking, JAK2 mutation negative.   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  Quit smoking in 6/23.  No ETOH.   Family History  Problem Relation Age of Onset   Stroke Mother    Bladder Cancer Father    Heart attack Brother    ROS: All systems reviewed and negative except as per HPI.   Current Outpatient Medications  Medication Sig Dispense Refill   albuterol (PROVENTIL) (2.5 MG/3ML) 0.083% nebulizer solution Take 3 mLs (2.5 mg total) by nebulization every 6 (six) hours as needed for wheezing or shortness  of breath. 540 mL 3   apixaban (ELIQUIS) 2.5 MG TABS tablet Take 1 tablet (2.5 mg total) by mouth 2 (two) times daily. 180 tablet 3   bisoprolol (ZEBETA) 5 MG tablet Take 1 tablet (5 mg total) by mouth daily. 90 tablet 3   Cholecalciferol (VITAMIN D-3) 125 MCG (5000 UT) TABS Take 5,000 Units by mouth daily.     clopidogrel (PLAVIX) 75 MG tablet Take 1 tablet (75 mg total) by mouth daily. 90 tablet 3   Cyanocobalamin 2500 MCG TABS Take 2,500 mcg by mouth daily.     empagliflozin (JARDIANCE) 10 MG TABS tablet Take 1 tablet (10  mg total) by mouth daily. 90 tablet 3   fluticasone (FLONASE) 50 MCG/ACT nasal spray Place 2 sprays into both nostrils daily. As  needed     fluticasone furoate-vilanterol (BREO ELLIPTA) 100-25 MCG/ACT AEPB Inhale 1 puff into the lungs in the morning. 3 each 3   Multiple Vitamins-Minerals (MULTIVITAMIN WITH MINERALS) tablet Take 1 tablet by mouth daily.     nitroGLYCERIN (NITROSTAT) 0.4 MG SL tablet Place 1 tablet (0.4 mg total) under the tongue every 5 (five) minutes as needed for chest pain. 25 tablet 3   nystatin (MYCOSTATIN) 100000 UNIT/ML suspension Take 2 mLs (200,000 Units total) by mouth 4 (four) times daily. 473 mL 0   Pitavastatin Calcium (LIVALO) 4 MG TABS Take 1 tablet (4 mg total) by mouth daily. 90 tablet 3   sacubitril-valsartan (ENTRESTO) 49-51 MG Take 1 tablet by mouth 2 (two) times daily. 180 tablet 3   spironolactone (ALDACTONE) 25 MG tablet Take 1 tablet (25 mg total) by mouth daily. 90 tablet 3   torsemide (DEMADEX) 20 MG tablet Take 0.5 tablets (10 mg total) by mouth every other day. Take an extra half tablet PRN for weight gain/edema. (3lbs in 1 day or 5 lbs in 1 week) 15 tablet 3   No current facility-administered medications for this encounter.   BP (!) 116/56   Pulse 86   Wt 111.8 kg (246 lb 6.4 oz)   SpO2 95%   BMI 35.35 kg/m  General: NAD Neck: No JVD, no thyromegaly or thyroid nodule.  Lungs: Distant BS CV: Nondisplaced PMI.  Heart regular S1/S2, no S3/S4, no murmur.  1+ ankle edema.  No carotid bruit.  Normal pedal pulses.  Abdomen: Soft, nontender, no hepatosplenomegaly, no distention.  Skin: Intact without lesions or rashes.  Neurologic: Alert and oriented x 3.  Psych: Normal affect. Extremities: No clubbing or cyanosis.  HEENT: Normal.   Assessment/Plan: 1.  CAD: Patient has history of CAD with prior PCI to OM in 2002.  NSTEMI in 3/23 with cath showing complex proximal LAD disease with serial 90% stenoses separated by small aneurysmal areas.  He had a  60% mid LCx stenosis.  RCA with mild disease.  He had DES x 2 to proximal and mid LAD. No chest pain.  - He will need to be on anticoagulation due to previous DVT/PE (apixaban), on plavix for at least 6 months, ideally 1 year. Not on ASA.  - Good lipids in 5/23 on Livalo.   2. Chronic systolic CHF: Medtronic ICD.  Ischemic cardiomyopathy.  Echo in 3/23 with EF 20-25%, severe LV dilation, RV mildly dysfunctional.  RHC in 3/23 with markedly elevated filling pressures, CI 2.03, and PAPI < 1. He is not volume overloaded on exam or by Optivol. NYHA class II.  - Continue torsemide 10 mg qod.  - Continue bisoprolol 5 mg daily (beta-1  selective with COPD).  - Increase Entresto to 49/51 bid, BMET/BNP today and BMET in 10 days.   - Continue empagliflozin 10 mg daily.  - Continue spironolactone 25 mg daily.  - QRS does not appear wide enough to benefit from CRT upgrade.   3. 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   - Continue apixaban, dose lowered to 2.5 mg bid while on Plavix.  4. COPD: He has quit smoking.  5. Thrombocytopenia: Mild, chronic.  6. Bladder cancer: Has been followed by cystoscopies   7. Polycythemia: JAK2 mutation negative, likely due to chronic smoking.   Followup 3 wks with HF pharmacist for med titration, followup 2 months with APP.   Loralie Champagne 02/27/2022

## 2022-03-08 ENCOUNTER — Telehealth: Payer: Self-pay | Admitting: Internal Medicine

## 2022-03-08 NOTE — Telephone Encounter (Signed)
Pt calling to see if his recent transmission went through

## 2022-03-09 ENCOUNTER — Other Ambulatory Visit
Admission: RE | Admit: 2022-03-09 | Discharge: 2022-03-09 | Disposition: A | Discharge status: Home / Self Care | Payer: Medicare Other | Source: Ambulatory Visit | Attending: Cardiology | Admitting: Cardiology

## 2022-03-09 DIAGNOSIS — I5022 Chronic systolic (congestive) heart failure: Secondary | ICD-10-CM | POA: Diagnosis present

## 2022-03-09 LAB — BASIC METABOLIC PANEL
Anion gap: 7 (ref 5–15)
BUN: 31 mg/dL — ABNORMAL HIGH (ref 8–23)
CO2: 22 mmol/L (ref 22–32)
Calcium: 9.1 mg/dL (ref 8.9–10.3)
Chloride: 111 mmol/L (ref 98–111)
Creatinine, Ser: 1.36 mg/dL — ABNORMAL HIGH (ref 0.61–1.24)
GFR, Estimated: 55 mL/min — ABNORMAL LOW (ref >60)
Glucose, Bld: 94 mg/dL (ref 70–99)
Potassium: 4.2 mmol/L (ref 3.5–5.1)
Sodium: 140 mmol/L (ref 135–145)

## 2022-03-09 NOTE — Telephone Encounter (Signed)
I let the patient know we received his transmission.

## 2022-03-19 NOTE — Progress Notes (Signed)
Remote ICD transmission.   

## 2022-03-20 ENCOUNTER — Ambulatory Visit (HOSPITAL_COMMUNITY)
Admission: RE | Admit: 2022-03-20 | Discharge: 2022-03-20 | Disposition: A | Discharge status: Home / Self Care | Payer: Medicare Other | Source: Ambulatory Visit | Attending: Cardiology | Admitting: Cardiology

## 2022-03-20 ENCOUNTER — Encounter (HOSPITAL_COMMUNITY): Payer: Self-pay

## 2022-03-20 VITALS — BP 116/62 | HR 73 | Wt 247.2 lb

## 2022-03-20 DIAGNOSIS — R0602 Shortness of breath: Secondary | ICD-10-CM | POA: Insufficient documentation

## 2022-03-20 DIAGNOSIS — I255 Ischemic cardiomyopathy: Secondary | ICD-10-CM | POA: Diagnosis not present

## 2022-03-20 DIAGNOSIS — I5022 Chronic systolic (congestive) heart failure: Secondary | ICD-10-CM | POA: Diagnosis not present

## 2022-03-20 DIAGNOSIS — Z79899 Other long term (current) drug therapy: Secondary | ICD-10-CM | POA: Diagnosis not present

## 2022-03-20 DIAGNOSIS — Z87891 Personal history of nicotine dependence: Secondary | ICD-10-CM | POA: Insufficient documentation

## 2022-03-20 DIAGNOSIS — I2510 Atherosclerotic heart disease of native coronary artery without angina pectoris: Secondary | ICD-10-CM | POA: Diagnosis not present

## 2022-03-20 DIAGNOSIS — J449 Chronic obstructive pulmonary disease, unspecified: Secondary | ICD-10-CM | POA: Insufficient documentation

## 2022-03-20 DIAGNOSIS — I252 Old myocardial infarction: Secondary | ICD-10-CM | POA: Diagnosis not present

## 2022-03-20 DIAGNOSIS — Z86718 Personal history of other venous thrombosis and embolism: Secondary | ICD-10-CM | POA: Diagnosis not present

## 2022-03-20 DIAGNOSIS — Z86711 Personal history of pulmonary embolism: Secondary | ICD-10-CM | POA: Diagnosis not present

## 2022-03-20 DIAGNOSIS — D696 Thrombocytopenia, unspecified: Secondary | ICD-10-CM | POA: Insufficient documentation

## 2022-03-20 DIAGNOSIS — Z7901 Long term (current) use of anticoagulants: Secondary | ICD-10-CM | POA: Insufficient documentation

## 2022-03-20 MED ORDER — BISOPROLOL FUMARATE 5 MG PO TABS: 7.5000 mg | ORAL_TABLET | Freq: Every day | ORAL | 3 refills | Status: DC

## 2022-03-20 MED ORDER — ENTRESTO 49-51 MG PO TABS: 1.0000 | ORAL_TABLET | Freq: Two times a day (BID) | ORAL | 3 refills | Status: DC

## 2022-03-20 NOTE — Progress Notes (Signed)
Advanced Heart Failure Clinic Note   PCP: Lottie Mussel, MD Cardiology: Dr. Aundra Dubin  HPI: 76 y.o. with history of COPD, DVT/PE, ischemic cardiomyopathy, and CAD presents for followup of CHF.  Patient suffered a myocardial infarction complicated by cardiac arrest in 2002 and required stenting of the obtuse marginal.  He notes that he subsequently had LV dysfunction.  In 2013, he required repeat diagnostic catheterization which revealed moderate disease distal to previously placed obtuse marginal stent with a 75% stenosis in the mid LAD with aneurysmal post stenotic dilation.  Per notes, he was medically managed.  In the setting of LV dysfunction with an EF as low as 20%, he underwent Medtronic AICD placement in April 2016, and up until 2022, he was followed closely by cardiology in [place], [place].  He did require admission to a hospital in [facility] in June 2002, in the setting of COPD exacerbation while visiting his son.  Per discharge summary, he was noted to have mild troponin elevation which was felt to be secondary to demand ischemia.   Mr. Lopezperez moved to [place] in December 2022 and has been living locally.  His ex-wife lives nearby and they are close friends.  On October 12 2021, he developed recurrent chest discomfort and was admitted to [facility] with NSTEMI.  He underwent diagnostic catheterization at [facility] regional revealing severe proximal to mid LAD disease with otherwise nonobstructive disease and patent obtuse marginal stent.  His LVEDP was approximately 40 mmHg and ventriculogram showed an EF of less than 25%.  He was given Lasix in the lab and after discussion with advanced heart failure team at [facility], he was transferred for further evaluation and potential inotropic support.  At [facility], he was diuresed and started on GDMT.  He had DES x 2 to proximal and mid LAD and was discharged home.    Returned to AHF clinic on 02/26/2022 and had quit smoking using nicotine  patches. Weight was stable. He was short of breath after walking up and down his stairs 3-4 times in a row. No dyspnea walking on flat ground.  No chest pain. Did not want to do cardiac rehab but was trying to exercise on his own.   Today he returns to HF clinic for pharmacist medication titration. At last visit with MD, Delene Loll was increased from 24/26 mg BID to 49/51 mg BID. Overall feeling good. Appetite ok. Denies dizziness, lightheadedness, fatigue, chest pain or palpitations. Still gets SOB when walking up stairs multiple times. He is currently back to smoking 1/3 PPD. Weight at home is stable at ~243 pounds. Takes torsemide 10 mg every other day and PRN. Has needed 2 PRN doses in the last month. Occasional LEE when he's active for a long period of time and typically resolves in 1 hour after rest. No LEE on exam today. Denies PND/Orthopnea.   HF Medications: Bisoprolol 5 mg daily Entresto 49/51 mg BID Spironolactone 25 mg daily Empagliflozin 10 mg daily Torsemide 10 mg every other day  Has the patient been experiencing any side effects to the medications prescribed?  No  Does the patient have any problems obtaining medications due to transportation or finances?  No - [employer]  Understanding of regimen: excellent Understanding of indications: excellent Potential of compliance: excellent Patient understands to avoid NSAIDs. Patient understands to avoid decongestants.    Pertinent Lab Values: (03/09/2022) Serum creatinine 1.36, BUN 31, Potassium 4.2, Sodium 140, (02/26/2022) BNP 175.8   Vital Signs: Weight: 247.2 lbs (last  clinic weight: 246 lbs) Blood pressure: 116/62 mmHg  Heart rate: 73 bpm   Assessment/Plan: 1.  CAD: Patient has history of CAD with prior PCI to OM in 2002.  NSTEMI in 10/2021 with cath showing complex proximal LAD disease with serial 90% stenoses separated by small aneurysmal areas.  He had a 60% mid LCx stenosis.  RCA with mild disease.  He had DES x 2 to proximal  and mid LAD. No chest pain.  - He will need to be on anticoagulation due to previous DVT/PE (apixaban), on plavix for at least 6 months, ideally 1 year. Not on ASA.  - Good lipids in 12/2021 on Livalo.   2. Chronic systolic CHF: Medtronic ICD.  Ischemic cardiomyopathy.  Echo in 10/2021 with EF 20-25%, severe LV dilation, RV mildly dysfunctional.  RHC in 10/2021 with markedly elevated filling pressures, CI 2.03, and PAPI < 1. - Volume status ok on exam. NYHA class II.  - Continue torsemide 10 mg every other day. - Increase bisoprolol to 7.5 mg daily (beta-1 selective with COPD).  - Continue Entresto 49/51 mg BID. - Continue spironolactone 25 mg daily.  - Continue empagliflozin 10 mg daily.  - QRS does not appear wide enough to benefit from CRT upgrade.   3. 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   - Continue low dose apixaban 2.5 mg BID per Amplify-Ext trial.  4. COPD: He had previously quit smoking, now back up to 1/3 PPD. Encouraged him to quit. 5. Thrombocytopenia: Mild, chronic.  6. Bladder cancer: Has been followed by cystoscopies   7. Polycythemia: JAK2 mutation negative, likely due to chronic smoking.   Follow up with APP clinic on 04/30/2022  Audry Riles, PharmD, BCPS, BCCP, CPP Heart Failure Clinic Pharmacist [phone]

## 2022-03-20 NOTE — Patient Instructions (Signed)
It was a pleasure seeing you today!  MEDICATIONS: -We are changing your medications today -Increase bisoprolol to 7.5 mg daily -Call if you have questions about your medications.  NEXT APPOINTMENT: Return to clinic on 04/30/2022 with APP clinic.  In general, to take care of your heart failure: -Limit your fluid intake to 2 Liters (half-gallon) per day.   -Limit your salt intake to ideally 2-3 grams (2000-3000 mg) per day. -Weigh yourself daily and record, and bring that "weight diary" to your next appointment.  (Weight gain of 2-3 pounds in 1 day typically means fluid weight.) -The medications for your heart are to help your heart and help you live longer.   -Please contact us before stopping any of your heart medications.  Call the clinic at [phone] with questions or to reschedule future appointments.

## 2022-03-23 ENCOUNTER — Other Ambulatory Visit: Payer: Self-pay | Admitting: Cardiology

## 2022-03-26 DIAGNOSIS — Z95810 Presence of automatic (implantable) cardiac defibrillator: Secondary | ICD-10-CM | POA: Insufficient documentation

## 2022-03-26 DIAGNOSIS — I429 Cardiomyopathy, unspecified: Secondary | ICD-10-CM | POA: Insufficient documentation

## 2022-03-26 DIAGNOSIS — L720 Epidermal cyst: Secondary | ICD-10-CM | POA: Insufficient documentation

## 2022-03-26 DIAGNOSIS — D492 Neoplasm of unspecified behavior of bone, soft tissue, and skin: Secondary | ICD-10-CM | POA: Insufficient documentation

## 2022-03-26 DIAGNOSIS — E7800 Pure hypercholesterolemia, unspecified: Secondary | ICD-10-CM | POA: Insufficient documentation

## 2022-03-26 DIAGNOSIS — I509 Heart failure, unspecified: Secondary | ICD-10-CM | POA: Insufficient documentation

## 2022-03-26 DIAGNOSIS — C680 Malignant neoplasm of urethra: Secondary | ICD-10-CM | POA: Insufficient documentation

## 2022-03-26 NOTE — Progress Notes (Unsigned)
[facility]: Clinic Note  Subjective:  History of Present Illness: Paul Bullock is a 76 y.o. year old male who presents for establishing care with me. He has been seen in our clinic before.  His main concern today is 1 month of throbbing pain beneath his right shoulder blade. Pain does not radiate, and is not associated with any numbness, tingling, or weakness of his arm. No aggravating factors and no associated trauma. Rubbing the spot improves the main.  He's also noticed about a month of bilateral breast tissue tenderness when he's in the shower. No discharge or masses noted. It is not particularly painful or bothersome to him. Has been on spironolactone since 10/2021, no recent dose changes.   Third, he has some patches of dry, flaky skin on both elbows and on his left hand. He's tried some OTC lotions with only mild improvement. No other rashes, no history of asthma/eczema.   He's working on quitting smoking, down to 1/2 ppd.   He's following closely with cardiology for ischemic cardiomyopathy & CAD.  Has been working hard at diet changes, and A1C is down from 7.0 to 5.6. Really happy about this.    Please refer to Assessment and Plan below for full details in Problem-Based Charting.   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   Colon polyps 12/05/2021   Acrochordon 12/05/2021   Thrombocytopenia ([place]) 10/13/2021   Polycythemia 10/13/2021   History of pulmonary embolus (PE) 10/12/2021   Rectal bleeding 10/12/2021   Atherosclerotic  heart disease of native coronary artery without angina pectoris 01/06/2021   Essential (primary) hypertension 01/06/2021   Hyperlipidemia 01/06/2021   Type 2 diabetes mellitus without complication ([place]) 74/25/9563   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   Ischemic cardiomyopathy 01/06/2021   Tobacco dependence 04/11/2018   History of bladder cancer 03/11/2018     Medications:  Current Outpatient Medications:    albuterol (PROVENTIL) (2.5 MG/3ML) 0.083% nebulizer solution, Take 3 mLs (2.5 mg total) by nebulization every 6 (six) hours as needed for wheezing or shortness of breath., Disp: 540 mL, Rfl: 3   apixaban (ELIQUIS) 2.5 MG TABS tablet, Take 1 tablet (2.5 mg total) by mouth 2 (two) times daily., Disp: 180 tablet, Rfl: 3   bisoprolol (ZEBETA) 5 MG tablet, Take 1.5 tablets (7.5 mg total) by mouth daily., Disp: 135 tablet, Rfl: 3   Cholecalciferol (VITAMIN D-3) 125 MCG (5000 UT) TABS, Take 5,000 Units by mouth daily., Disp: , Rfl:    clopidogrel (PLAVIX) 75 MG tablet, Take 1 tablet (75 mg total) by mouth daily., Disp: 90 tablet, Rfl: 3   Cyanocobalamin 2500 MCG TABS, Take 2,500 mcg by mouth daily., Disp: , Rfl:    empagliflozin (JARDIANCE) 10 MG TABS tablet, Take 1 tablet (10 mg total) by mouth daily., Disp: 90 tablet, Rfl: 3   fluticasone (FLONASE) 50 MCG/ACT nasal spray, Place 2 sprays into both nostrils daily. As  needed, Disp: , Rfl:    fluticasone furoate-vilanterol (BREO ELLIPTA) 100-25 MCG/ACT AEPB, Inhale 1 puff into the lungs in the morning., Disp: 3 each, Rfl: 3   Multiple Vitamins-Minerals (MULTIVITAMIN WITH MINERALS)  tablet, Take 1 tablet by mouth daily., Disp: , Rfl:    nitroGLYCERIN (NITROSTAT) 0.4 MG SL tablet, Place 1 tablet (0.4 mg total) under the tongue every 5 (five) minutes as needed for chest pain., Disp: 25 tablet, Rfl: 3   nystatin (MYCOSTATIN) 100000 UNIT/ML suspension, Take 2 mLs (200,000 Units total) by mouth 4 (four) times daily., Disp: 473 mL, Rfl: 0   Pitavastatin  Calcium (LIVALO) 4 MG TABS, Take 1 tablet (4 mg total) by mouth daily., Disp: 90 tablet, Rfl: 3   sacubitril-valsartan (ENTRESTO) 49-51 MG, Take 1 tablet by mouth 2 (two) times daily., Disp: 180 tablet, Rfl: 3   spironolactone (ALDACTONE) 25 MG tablet, Take 1 tablet (25 mg total) by mouth daily., Disp: 90 tablet, Rfl: 3   torsemide (DEMADEX) 20 MG tablet, Take 0.5 tablets (10 mg total) by mouth every other day. Take an extra half tablet PRN for weight gain/edema. (3lbs in 1 day or 5 lbs in 1 week), Disp: 15 tablet, Rfl: 3   Allergies: Allergies  Allergen Reactions   Atorvastatin Other (See Comments)    Myalgias   Rosuvastatin Other (See Comments)    myalgias     Objective:   Vitals: Vitals:   03/28/22 0954  BP: (!) 98/51  Pulse: 67  Temp: (!) 97.5 F (36.4 C)  SpO2: 95%    Physical Exam: Physical Exam Constitutional:      Appearance: Normal appearance.  Musculoskeletal:     Comments: No tenderness to palpation over cervical, thoracic, lumbar spine. +paraspinal muscle tenderness on R mid-thoracic region. Normal ROM of R shoulder, no pain with empty can test or any movement  Neurological:     Mental Status: He is alert.      Data: Labs, imaging, and micro were reviewed in Epic. Refer to Assessment and Plan below for full details in Problem-Based Charting.  Assessment & Plan:  Muscle strain of right upper back - I think his pain represents a muscle strain - Will start conservatively with OTC Voltaren gel and stretches, which I provided for him today - If not improved, would try Flexeril '5mg'$  at night & PT referral   Type 2 diabetes mellitus without complication ([name]) - Praised for getting A1C down to 5.6 with exercise & diet, plus Jardiance  - No new medicines today - He is up to date on foot & eye exams  History of pulmonary embolus (PE) - Continue Apixaban 2.'5mg'$  BID (dosed low while he's also on Plavix)  Ischemic cardiomyopathy - Managed by Cardiology, he's seeing  them next month and doing well - EF 20-25%, he has an ICD - Continue Torse 10, biso 7.5 daily, entresto 49/51 bid, jardiance 10, spiro 25  Tobacco dependence - Has cut back to 1/2 ppd, and will continue to cut down  Atherosclerotic heart disease of native coronary artery without angina pectoris - Continue Plavix '75mg'$  daily for at least 6 months, ideally 1 year, after MI. Will defer exact timing to cardiology - Agree with Cardiology's plan for no aspirin while he's also no Apixaban   Healthcare maintenance - Address colonoscopy next visit   Drug-induced gynecomastia - Since this is very mild and not bothersome to patient, will continue Spironolactone for now and monitor. If symptoms get worse, we may need to switch off Arlyce Harman. I'll let his cardiologist know      Patient will follow up in 4-5 months  Lottie Mussel, MD

## 2022-03-28 ENCOUNTER — Encounter: Payer: Self-pay | Admitting: Internal Medicine

## 2022-03-28 ENCOUNTER — Ambulatory Visit (INDEPENDENT_AMBULATORY_CARE_PROVIDER_SITE_OTHER): Payer: Medicare Other | Admitting: Internal Medicine

## 2022-03-28 VITALS — BP 98/51 | HR 67 | Temp 97.5°F | Ht 70.0 in | Wt 247.1 lb

## 2022-03-28 DIAGNOSIS — T50905A Adverse effect of unspecified drugs, medicaments and biological substances, initial encounter: Secondary | ICD-10-CM

## 2022-03-28 DIAGNOSIS — F17210 Nicotine dependence, cigarettes, uncomplicated: Secondary | ICD-10-CM | POA: Diagnosis not present

## 2022-03-28 DIAGNOSIS — S29012A Strain of muscle and tendon of back wall of thorax, initial encounter: Secondary | ICD-10-CM | POA: Insufficient documentation

## 2022-03-28 DIAGNOSIS — Z86711 Personal history of pulmonary embolism: Secondary | ICD-10-CM

## 2022-03-28 DIAGNOSIS — E119 Type 2 diabetes mellitus without complications: Secondary | ICD-10-CM | POA: Diagnosis present

## 2022-03-28 DIAGNOSIS — I2510 Atherosclerotic heart disease of native coronary artery without angina pectoris: Secondary | ICD-10-CM | POA: Diagnosis not present

## 2022-03-28 DIAGNOSIS — F17200 Nicotine dependence, unspecified, uncomplicated: Secondary | ICD-10-CM

## 2022-03-28 DIAGNOSIS — I255 Ischemic cardiomyopathy: Secondary | ICD-10-CM | POA: Diagnosis not present

## 2022-03-28 DIAGNOSIS — Z0000 Encounter for general adult medical examination without abnormal findings: Secondary | ICD-10-CM

## 2022-03-28 DIAGNOSIS — J449 Chronic obstructive pulmonary disease, unspecified: Secondary | ICD-10-CM

## 2022-03-28 LAB — POCT GLYCOSYLATED HEMOGLOBIN (HGB A1C): Hemoglobin A1C: 5.6 % (ref 4.0–5.6)

## 2022-03-28 LAB — GLUCOSE, CAPILLARY: Glucose-Capillary: 124 mg/dL — ABNORMAL HIGH (ref 70–99)

## 2022-03-28 NOTE — Assessment & Plan Note (Signed)
-   Continue Plavix '75mg'$  daily for at least 6 months, ideally 1 year, after MI. Will defer exact timing to cardiology - Agree with Cardiology's plan for no aspirin while he's also no Apixaban

## 2022-03-28 NOTE — Assessment & Plan Note (Signed)
-   Continue Apixaban 2.'5mg'$  BID (dosed low while he's also on Plavix)

## 2022-03-28 NOTE — Assessment & Plan Note (Signed)
-   Since this is very mild and not bothersome to patient, will continue Spironolactone for now and monitor. If symptoms get worse, we may need to switch off Arlyce Harman. I'll let his cardiologist know

## 2022-03-28 NOTE — Assessment & Plan Note (Signed)
-   Managed by Cardiology, he's seeing them next month and doing well - EF 20-25%, he has an ICD - Continue Torse 10, biso 7.5 daily, entresto 49/51 bid, jardiance 10, spiro 25

## 2022-03-28 NOTE — Patient Instructions (Signed)
It was a pleasure meeting you today.  For your dry skin, I recommend using vaseline, rather than lotions. Anything that is solid at room temperature will be best.  For your breast tenderness, I think this is probably from your spironolactone. If this starts bothering you, please let me or your cardiologist know, and we may need to switch this medicine. For now, we will keep it the same.  For your back throbbing, your muscles in your upper back are very tight. I recommend Voltaren gel (you can get this over the counter at most drug stores) and doing stretches for your lower trapezius muscle. If those don't work, the next steps would be a physical therapy referral and/or a low dose muscle relaxant.   I'll see you back in 4-5 months. You can schedule before or after the holidays.  Please call if you need anything in the meantime.  Sincerely, Dr. Lottie Mussel

## 2022-03-28 NOTE — Assessment & Plan Note (Signed)
-   Praised for getting A1C down to 5.6 with exercise & diet, plus Jardiance  - No new medicines today - He is up to date on foot & eye exams

## 2022-03-28 NOTE — Assessment & Plan Note (Signed)
-   Address colonoscopy next visit

## 2022-03-28 NOTE — Assessment & Plan Note (Signed)
-   I think his pain represents a muscle strain - Will start conservatively with OTC Voltaren gel and stretches, which I provided for him today - If not improved, would try Flexeril '5mg'$  at night & PT referral

## 2022-03-28 NOTE — Assessment & Plan Note (Signed)
-   Has cut back to 1/2 ppd, and will continue to cut down

## 2022-03-28 NOTE — Assessment & Plan Note (Signed)
>>  ASSESSMENT AND PLAN FOR ISCHEMIC CARDIOMYOPATHY WRITTEN ON 03/28/2022 11:46 AM BY Jamaurie Bernier, MD  - Managed by Cardiology, he's seeing them next month and doing well - EF 20-25%, he has an ICD - Continue Torse 10, biso 7.5 daily, entresto  49/51 bid, jardiance  10, spiro 25

## 2022-04-26 ENCOUNTER — Encounter (HOSPITAL_COMMUNITY): Payer: Medicare Other

## 2022-04-30 ENCOUNTER — Telehealth: Payer: Self-pay

## 2022-04-30 ENCOUNTER — Encounter (HOSPITAL_COMMUNITY): Payer: Medicare Other

## 2022-04-30 NOTE — Telephone Encounter (Signed)
The patient is now being followed by [facility].

## 2022-04-30 NOTE — Telephone Encounter (Signed)
I called [facility] to ask them to release the patient back to [place]. The patient states he wants to stay with [facility].   [facility]  phone number [phone].

## 2022-04-30 NOTE — Telephone Encounter (Signed)
Pt is back in our system and I put him back on our remote schedule.

## 2022-05-11 ENCOUNTER — Emergency Department: Payer: Medicare Other

## 2022-05-11 ENCOUNTER — Inpatient Hospital Stay
Admission: EM | Admit: 2022-05-11 | Discharge: 2022-05-13 | DRG: 190 | Disposition: A | Discharge status: Home / Self Care | Payer: Medicare Other | Attending: Internal Medicine | Admitting: Internal Medicine

## 2022-05-11 ENCOUNTER — Ambulatory Visit
Admission: RE | Admit: 2022-05-11 | Discharge: 2022-05-11 | Payer: Medicare Other | Source: Ambulatory Visit | Attending: Internal Medicine | Admitting: Internal Medicine

## 2022-05-11 ENCOUNTER — Other Ambulatory Visit: Payer: Self-pay

## 2022-05-11 VITALS — BP 127/75 | HR 82 | Temp 98.4°F | Resp 21

## 2022-05-11 DIAGNOSIS — Z8249 Family history of ischemic heart disease and other diseases of the circulatory system: Secondary | ICD-10-CM

## 2022-05-11 DIAGNOSIS — F17210 Nicotine dependence, cigarettes, uncomplicated: Secondary | ICD-10-CM | POA: Diagnosis present

## 2022-05-11 DIAGNOSIS — Z95810 Presence of automatic (implantable) cardiac defibrillator: Secondary | ICD-10-CM | POA: Diagnosis not present

## 2022-05-11 DIAGNOSIS — R0602 Shortness of breath: Secondary | ICD-10-CM

## 2022-05-11 DIAGNOSIS — I2510 Atherosclerotic heart disease of native coronary artery without angina pectoris: Secondary | ICD-10-CM | POA: Diagnosis present

## 2022-05-11 DIAGNOSIS — Z9221 Personal history of antineoplastic chemotherapy: Secondary | ICD-10-CM | POA: Diagnosis not present

## 2022-05-11 DIAGNOSIS — I252 Old myocardial infarction: Secondary | ICD-10-CM | POA: Diagnosis not present

## 2022-05-11 DIAGNOSIS — E1122 Type 2 diabetes mellitus with diabetic chronic kidney disease: Secondary | ICD-10-CM | POA: Diagnosis present

## 2022-05-11 DIAGNOSIS — Z79899 Other long term (current) drug therapy: Secondary | ICD-10-CM

## 2022-05-11 DIAGNOSIS — N1831 Chronic kidney disease, stage 3a: Secondary | ICD-10-CM | POA: Diagnosis present

## 2022-05-11 DIAGNOSIS — J441 Chronic obstructive pulmonary disease with (acute) exacerbation: Secondary | ICD-10-CM | POA: Diagnosis not present

## 2022-05-11 DIAGNOSIS — E119 Type 2 diabetes mellitus without complications: Secondary | ICD-10-CM | POA: Diagnosis not present

## 2022-05-11 DIAGNOSIS — J9601 Acute respiratory failure with hypoxia: Secondary | ICD-10-CM | POA: Diagnosis present

## 2022-05-11 DIAGNOSIS — E785 Hyperlipidemia, unspecified: Secondary | ICD-10-CM | POA: Diagnosis not present

## 2022-05-11 DIAGNOSIS — D696 Thrombocytopenia, unspecified: Secondary | ICD-10-CM | POA: Diagnosis present

## 2022-05-11 DIAGNOSIS — I255 Ischemic cardiomyopathy: Secondary | ICD-10-CM | POA: Diagnosis present

## 2022-05-11 DIAGNOSIS — Z823 Family history of stroke: Secondary | ICD-10-CM

## 2022-05-11 DIAGNOSIS — Z8052 Family history of malignant neoplasm of bladder: Secondary | ICD-10-CM

## 2022-05-11 DIAGNOSIS — D72828 Other elevated white blood cell count: Secondary | ICD-10-CM | POA: Diagnosis present

## 2022-05-11 DIAGNOSIS — Z923 Personal history of irradiation: Secondary | ICD-10-CM | POA: Diagnosis not present

## 2022-05-11 DIAGNOSIS — I5022 Chronic systolic (congestive) heart failure: Secondary | ICD-10-CM | POA: Diagnosis not present

## 2022-05-11 DIAGNOSIS — E669 Obesity, unspecified: Secondary | ICD-10-CM | POA: Diagnosis present

## 2022-05-11 DIAGNOSIS — J449 Chronic obstructive pulmonary disease, unspecified: Secondary | ICD-10-CM | POA: Diagnosis not present

## 2022-05-11 DIAGNOSIS — Z7902 Long term (current) use of antithrombotics/antiplatelets: Secondary | ICD-10-CM

## 2022-05-11 DIAGNOSIS — I130 Hypertensive heart and chronic kidney disease with heart failure and stage 1 through stage 4 chronic kidney disease, or unspecified chronic kidney disease: Secondary | ICD-10-CM | POA: Diagnosis present

## 2022-05-11 DIAGNOSIS — R058 Other specified cough: Secondary | ICD-10-CM | POA: Diagnosis not present

## 2022-05-11 DIAGNOSIS — Z86711 Personal history of pulmonary embolism: Secondary | ICD-10-CM

## 2022-05-11 DIAGNOSIS — Z7901 Long term (current) use of anticoagulants: Secondary | ICD-10-CM

## 2022-05-11 DIAGNOSIS — Z20822 Contact with and (suspected) exposure to covid-19: Secondary | ICD-10-CM | POA: Diagnosis present

## 2022-05-11 DIAGNOSIS — I10 Essential (primary) hypertension: Secondary | ICD-10-CM | POA: Diagnosis present

## 2022-05-11 DIAGNOSIS — Z6835 Body mass index (BMI) 35.0-35.9, adult: Secondary | ICD-10-CM

## 2022-05-11 DIAGNOSIS — Z8551 Personal history of malignant neoplasm of bladder: Secondary | ICD-10-CM | POA: Diagnosis not present

## 2022-05-11 LAB — TROPONIN I (HIGH SENSITIVITY)
Troponin I (High Sensitivity): 14 ng/L (ref <18)
Troponin I (High Sensitivity): 14 ng/L (ref <18)

## 2022-05-11 LAB — BASIC METABOLIC PANEL
Anion gap: 4 — ABNORMAL LOW (ref 5–15)
BUN: 30 mg/dL — ABNORMAL HIGH (ref 8–23)
CO2: 22 mmol/L (ref 22–32)
Calcium: 9.4 mg/dL (ref 8.9–10.3)
Chloride: 112 mmol/L — ABNORMAL HIGH (ref 98–111)
Creatinine, Ser: 1.37 mg/dL — ABNORMAL HIGH (ref 0.61–1.24)
GFR, Estimated: 54 mL/min — ABNORMAL LOW (ref >60)
Glucose, Bld: 108 mg/dL — ABNORMAL HIGH (ref 70–99)
Potassium: 4.2 mmol/L (ref 3.5–5.1)
Sodium: 138 mmol/L (ref 135–145)

## 2022-05-11 LAB — CBC
HCT: 54.3 % — ABNORMAL HIGH (ref 39.0–52.0)
Hemoglobin: 18 g/dL — ABNORMAL HIGH (ref 13.0–17.0)
MCH: 31 pg (ref 26.0–34.0)
MCHC: 33.1 g/dL (ref 30.0–36.0)
MCV: 93.5 fL (ref 80.0–100.0)
Platelets: 102 10*3/uL — ABNORMAL LOW (ref 150–400)
RBC: 5.81 MIL/uL (ref 4.22–5.81)
RDW: 13.2 % (ref 11.5–15.5)
WBC: 8.3 10*3/uL (ref 4.0–10.5)
nRBC: 0 % (ref 0.0–0.2)

## 2022-05-11 LAB — SARS CORONAVIRUS 2 BY RT PCR: SARS Coronavirus 2 by RT PCR: NEGATIVE

## 2022-05-11 MED ORDER — ALBUTEROL SULFATE (2.5 MG/3ML) 0.083% IN NEBU
5.0000 mg | INHALATION_SOLUTION | Freq: Once | RESPIRATORY_TRACT | Status: AC
  Administered 2022-05-11: 5 mg via RESPIRATORY_TRACT
  Filled 2022-05-11: qty 6

## 2022-05-11 MED ORDER — APIXABAN 2.5 MG PO TABS
2.5000 mg | ORAL_TABLET | Freq: Two times a day (BID) | ORAL | Status: DC
  Administered 2022-05-12 – 2022-05-13 (×3): 2.5 mg via ORAL
  Filled 2022-05-11 (×3): qty 1

## 2022-05-11 MED ORDER — METHYLPREDNISOLONE SODIUM SUCC 125 MG IJ SOLR
125.0000 mg | INTRAMUSCULAR | Status: AC
  Administered 2022-05-11: 125 mg via INTRAVENOUS
  Filled 2022-05-11: qty 2

## 2022-05-11 MED ORDER — VITAMIN D3 25 MCG (1000 UNIT) PO TABS
5000.0000 [IU] | ORAL_TABLET | Freq: Every day | ORAL | Status: DC
  Administered 2022-05-12 – 2022-05-13 (×2): 5000 [IU] via ORAL
  Filled 2022-05-11 (×4): qty 5

## 2022-05-11 MED ORDER — IPRATROPIUM-ALBUTEROL 0.5-2.5 (3) MG/3ML IN SOLN
3.0000 mL | Freq: Once | RESPIRATORY_TRACT | Status: AC
  Administered 2022-05-11: 3 mL via RESPIRATORY_TRACT
  Filled 2022-05-11: qty 3

## 2022-05-11 MED ORDER — CLOPIDOGREL BISULFATE 75 MG PO TABS
75.0000 mg | ORAL_TABLET | Freq: Every day | ORAL | Status: DC
  Administered 2022-05-12 – 2022-05-13 (×2): 75 mg via ORAL
  Filled 2022-05-11 (×2): qty 1

## 2022-05-11 MED ORDER — SACUBITRIL-VALSARTAN 49-51 MG PO TABS
1.0000 | ORAL_TABLET | Freq: Two times a day (BID) | ORAL | Status: DC
Start: 2022-05-12 — End: 2022-05-13
  Administered 2022-05-12 – 2022-05-13 (×3): 1 via ORAL
  Filled 2022-05-11 (×3): qty 1

## 2022-05-11 MED ORDER — SPIRONOLACTONE 25 MG PO TABS
25.0000 mg | ORAL_TABLET | Freq: Every day | ORAL | Status: DC
  Administered 2022-05-12 – 2022-05-13 (×2): 25 mg via ORAL
  Filled 2022-05-11 (×2): qty 1

## 2022-05-11 MED ORDER — VITAMIN B-12 1000 MCG PO TABS
2000.0000 ug | ORAL_TABLET | Freq: Every day | ORAL | Status: DC
  Administered 2022-05-12 – 2022-05-13 (×2): 2000 ug via ORAL
  Filled 2022-05-11 (×2): qty 2

## 2022-05-11 MED ORDER — TRAZODONE HCL 50 MG PO TABS
25.0000 mg | ORAL_TABLET | Freq: Every evening | ORAL | Status: DC | PRN
  Filled 2022-05-11: qty 1

## 2022-05-11 MED ORDER — ADULT MULTIVITAMIN W/MINERALS CH
1.0000 | ORAL_TABLET | Freq: Every day | ORAL | Status: DC
  Administered 2022-05-12 – 2022-05-13 (×2): 1 via ORAL
  Filled 2022-05-11 (×2): qty 1

## 2022-05-11 MED ORDER — PREDNISONE 20 MG PO TABS
40.0000 mg | ORAL_TABLET | Freq: Every day | ORAL | Status: DC
  Administered 2022-05-13: 40 mg via ORAL
  Filled 2022-05-11: qty 2

## 2022-05-11 MED ORDER — SODIUM CHLORIDE 0.9 % IV SOLN
2.0000 g | Freq: Once | INTRAVENOUS | Status: AC
  Administered 2022-05-11: 2 g via INTRAVENOUS
  Filled 2022-05-11: qty 20

## 2022-05-11 MED ORDER — METHYLPREDNISOLONE SODIUM SUCC 40 MG IJ SOLR
40.0000 mg | Freq: Two times a day (BID) | INTRAMUSCULAR | Status: AC
  Administered 2022-05-12 (×2): 40 mg via INTRAVENOUS
  Filled 2022-05-11 (×2): qty 1

## 2022-05-11 MED ORDER — IOHEXOL 350 MG/ML SOLN
75.0000 mL | Freq: Once | INTRAVENOUS | Status: AC | PRN
  Administered 2022-05-11: 75 mL via INTRAVENOUS

## 2022-05-11 MED ORDER — ACETAMINOPHEN 325 MG PO TABS: 650.0000 mg | ORAL_TABLET | Freq: Four times a day (QID) | ORAL | Status: DC | PRN

## 2022-05-11 MED ORDER — SODIUM CHLORIDE 0.9 % IV SOLN: INTRAVENOUS | Status: DC

## 2022-05-11 MED ORDER — MAGNESIUM HYDROXIDE 400 MG/5ML PO SUSP: 30.0000 mL | Freq: Every day | ORAL | Status: DC | PRN

## 2022-05-11 MED ORDER — NITROGLYCERIN 0.4 MG SL SUBL
0.4000 mg | SUBLINGUAL_TABLET | SUBLINGUAL | Status: DC | PRN
Start: 2022-05-11 — End: 2022-05-13

## 2022-05-11 MED ORDER — ACETAMINOPHEN 650 MG RE SUPP: 650.0000 mg | Freq: Four times a day (QID) | RECTAL | Status: DC | PRN

## 2022-05-11 MED ORDER — PRAVASTATIN SODIUM 20 MG PO TABS
40.0000 mg | ORAL_TABLET | Freq: Every day | ORAL | Status: DC
  Administered 2022-05-12: 40 mg via ORAL
  Filled 2022-05-11: qty 2

## 2022-05-11 MED ORDER — SODIUM CHLORIDE 0.9 % IV SOLN: 1.0000 g | INTRAVENOUS | Status: DC

## 2022-05-11 MED ORDER — ONDANSETRON HCL 4 MG/2ML IJ SOLN: 4.0000 mg | Freq: Four times a day (QID) | INTRAMUSCULAR | Status: DC | PRN

## 2022-05-11 MED ORDER — FLUTICASONE PROPIONATE 50 MCG/ACT NA SUSP
2.0000 | Freq: Every day | NASAL | Status: DC | PRN
Start: 2022-05-12 — End: 2022-05-13

## 2022-05-11 MED ORDER — BISOPROLOL FUMARATE 5 MG PO TABS
7.5000 mg | ORAL_TABLET | Freq: Every day | ORAL | Status: DC
  Administered 2022-05-12 – 2022-05-13 (×2): 7.5 mg via ORAL
  Filled 2022-05-11 (×2): qty 1.5

## 2022-05-11 MED ORDER — EMPAGLIFLOZIN 10 MG PO TABS
10.0000 mg | ORAL_TABLET | Freq: Every day | ORAL | Status: DC
  Administered 2022-05-12 – 2022-05-13 (×2): 10 mg via ORAL
  Filled 2022-05-11 (×2): qty 1

## 2022-05-11 MED ORDER — SODIUM CHLORIDE 0.9 % IV SOLN
500.0000 mg | Freq: Once | INTRAVENOUS | Status: AC
  Administered 2022-05-12: 500 mg via INTRAVENOUS
  Filled 2022-05-11: qty 5

## 2022-05-11 MED ORDER — ONDANSETRON HCL 4 MG PO TABS: 4.0000 mg | ORAL_TABLET | Freq: Four times a day (QID) | ORAL | Status: DC | PRN

## 2022-05-11 NOTE — H&P (Signed)
[place]   PATIENT NAME: Paul Bullock    MR#:  [id number]  DATE OF BIRTH:  [birth year]  DATE OF ADMISSION:  05/11/2022  PRIMARY CARE PHYSICIAN: Lottie Mussel, MD   Patient is coming from: Home  REQUESTING/REFERRING PHYSICIAN: Brenton Grills, MD CHIEF COMPLAINT:   Chief Complaint  Patient presents with   Shortness of Breath    HISTORY OF PRESENT ILLNESS:  Gunnard Dorrance is a 76 y.o. Caucasian male with medical history significant for coronary artery disease, chronic HfrEF, COPD, depression, DVT, essential hypertension, dyslipidemia, ischemic cardiomyopathy, migraine, pulmonary embolism and CAD, who presented to the ER with acute onset of post dyspnea with associated cough productive of clear sputum that later on was purulent as well as wheezing since Tuesday with no fever or chills.  He denies any chest pain or palpitations.  No nausea or vomiting or abdominal pain.  No dysuria, oliguria or hematuria or flank pain.  ED Course: When she came to the ER, respiratory it was 24 and pulse currently was 89-90% on room air and 94 to 97% on 2 L of O2 by nasal cannula.  Labs revealed a BUN of 38 with creatinine 1.37 better than previous levels and CBC showed hemoconcentration high sensitive troponin I was 14 twice.  Labs revealed EKG as reviewed by me : EKG showed sinus rhythm with a rate of 84 with poor R wave progression, nonspecific intraventricular conduction delay, PVCs,.. Imaging: Chest x-ray showed chronic appearing increased interstitial lung markings with very mild bibasal atelectasis.  Chest CTA revealed no evidence for PE.  It showed coronary artery disease with no focal lung consolidation or pleural effusion or pneumothorax.  Showed small hiatal hernia with colonic diverticulosis.  The patient was given nebulized albuterol and DuoNeb, 2 g of IV Rocephin and 500 mg of IV Zithromax and 125 mg of IV Solu-Medrol.  He will be admitted to a medical telemetry bed for further  evaluation and management. PAST MEDICAL HISTORY:   Past Medical History:  Diagnosis Date   AICD (automatic cardioverter/defibrillator) present    a. 11/2014 s/p MDT DDMB 1D4 [facility] MRI XT DR AICD (ser# [id number]).   Bladder cancer ([place])    a. 2013 s/p chemo/xrt.   CAD (coronary artery disease)    a. 11/2020 s/p MI/cardiac arrest-->DES to LCX x 1; b. 10/2011 Cath: LAD 70mw/ aneurysmal post-stenotic dilation-->med rx. LCX 50d to prior stent; c. 10/2021 NSTEMI/Cath: LM 30ost/m, LAD 90p/m, 937mLCX 20p/m, patent stent, LPAV 60, RCA mild diff dzs, EF<25%, LVEDP 4051m.   Chronic HFrEF (heart failure with reduced ejection fraction) (HCC)    a. EF prev as low as 20% w/ subsequent improvement to 50-55%; b. 11/2020 limited echo: "windows were challenging...LVEF largely preserved."; c. 10/2021 LV gram: EF <25%, LVEDP 43m44m   COPD (chronic obstructive pulmonary disease) (HCC)    Depression    Diverticulitis    DVT (deep venous thrombosis) (HCC)[place] Essential hypertension    Hyperlipidemia LDL goal <70    Ischemic cardiomyopathy    a. EF prev as low as 20%; b. 11/2014 s/p MDT [id number] MRI XT DR AICD; c. subsequent improvement in EF to 50-55%; d. 11/2020 limited echo: "windows were challenging...LVEF largely preserved."; e. 10/2021 LV Gram: EF <25%.   MI (myocardial infarction) (HCC)[place] Migraines    Morbid obesity (HCC)[place] Pulmonary embolism (unprovoked)    a. x 2 -->chronic eliquis.   Thrombocytopenia (HCC)[place]  Tobacco abuse    Varicose veins of left lower extremity     PAST SURGICAL HISTORY:   Past Surgical History:  Procedure Laterality Date   CARDIAC DEFIBRILLATOR PLACEMENT     CORONARY STENT INTERVENTION N/A 10/18/2021   Procedure: CORONARY STENT INTERVENTION;  Surgeon: [name], MD;  Location: [facility];  Service: Cardiovascular;  Laterality: N/A;  Prox and Mid LAD   MOHS SURGERY     PERCUTANEOUS CORONARY STENT INTERVENTION (PCI-S)     RIGHT/LEFT HEART CATH AND CORONARY  ANGIOGRAPHY N/A 10/13/2021   Procedure: RIGHT/LEFT HEART CATH AND CORONARY ANGIOGRAPHY;  Surgeon: [name], MD;  Location: [facility];  Service: Cardiovascular;  Laterality: N/A;   VASECTOMY     VASECTOMY REVERSAL      SOCIAL HISTORY:   Social History   Tobacco Use   Smoking status: Every Day    Packs/day: 0.75    Years: 55.00    Total pack years: 41.25    Types: Cigarettes   Smokeless tobacco: Never   Tobacco comments:    10 cigs per day  Substance Use Topics   Alcohol use: Never    FAMILY HISTORY:   Family History  Problem Relation Age of Onset   Stroke Mother    Bladder Cancer Father    Heart attack Brother     DRUG ALLERGIES:   Allergies  Allergen Reactions   Atorvastatin Other (See Comments)    Myalgias   Rosuvastatin Other (See Comments)    myalgias    REVIEW OF SYSTEMS:   ROS As per history of present illness. All pertinent systems were reviewed above. Constitutional, HEENT, cardiovascular, respiratory, GI, GU, musculoskeletal, neuro, psychiatric, endocrine, integumentary and hematologic systems were reviewed and are otherwise negative/unremarkable except for positive findings mentioned above in the HPI.   MEDICATIONS AT HOME:   Prior to Admission medications   Medication Sig Start Date End Date Taking? Authorizing Provider  albuterol (PROVENTIL) (2.5 MG/3ML) 0.083% nebulizer solution Take 3 mLs (2.5 mg total) by nebulization every 6 (six) hours as needed for wheezing or shortness of breath. 01/16/22 01/11/23  Lottie Mussel, MD  apixaban (ELIQUIS) 2.5 MG TABS tablet Take 1 tablet (2.5 mg total) by mouth 2 (two) times daily. 11/14/21   Deboraha Sprang, MD  bisoprolol (ZEBETA) 5 MG tablet Take 1.5 tablets (7.5 mg total) by mouth daily. 03/20/22   Larey Dresser, MD  Cholecalciferol (VITAMIN D-3) 125 MCG (5000 UT) TABS Take 5,000 Units by mouth daily.    [provider]  clopidogrel (PLAVIX) 75 MG tablet Take 1 tablet (75 mg total)  by mouth daily. 11/01/21   Larey Dresser, MD  Cyanocobalamin 2500 MCG TABS Take 2,500 mcg by mouth daily.    [provider]  empagliflozin (JARDIANCE) 10 MG TABS tablet Take 1 tablet (10 mg total) by mouth daily. 11/01/21   Larey Dresser, MD  fluticasone [facility]) 50 MCG/ACT nasal spray Place 2 sprays into both nostrils daily. As  needed    [provider]  Multiple Vitamins-Minerals (MULTIVITAMIN WITH MINERALS) tablet Take 1 tablet by mouth daily.    [provider]  nitroGLYCERIN (NITROSTAT) 0.4 MG SL tablet Place 1 tablet (0.4 mg total) under the tongue every 5 (five) minutes as needed for chest pain. 11/01/21   Larey Dresser, MD  nystatin (MYCOSTATIN) 100000 UNIT/ML suspension Take 2 mLs (200,000 Units total) by mouth 4 (four) times daily. 11/01/21   Larey Dresser, MD  Pitavastatin Calcium (LIVALO) 4 MG TABS Take 1 tablet (4 mg total) by mouth daily. 10/27/21   Larey Dresser, MD  sacubitril-valsartan (ENTRESTO) 49-51 MG Take 1 tablet by mouth 2 (two) times daily. 03/20/22   Larey Dresser, MD  spironolactone (ALDACTONE) 25 MG tablet Take 1 tablet (25 mg total) by mouth daily. 11/01/21   Larey Dresser, MD  torsemide (DEMADEX) 20 MG tablet Take 0.5 tablets (10 mg total) by mouth every other day. Take an extra half tablet PRN for weight gain/edema. (3lbs in 1 day or 5 lbs in 1 week) 12/15/21   [place], Maricela Bo, FNP      VITAL SIGNS:  Blood pressure 115/86, pulse 90, temperature 98.4 F (36.9 C), temperature source Oral, resp. rate 20, height '5\' 10"'$  (1.778 m), weight 110.9 kg, SpO2 93 %.  PHYSICAL EXAMINATION:  Physical Exam  GENERAL:  76 y.o.-year-old patient lying in the bed with no acute distress.  EYES: Pupils equal, round, reactive to light and accommodation. No scleral icterus. Extraocular muscles intact.  HEENT: Head atraumatic, normocephalic. Oropharynx and nasopharynx clear.  NECK:  Supple, no jugular venous distention. No thyroid  enlargement, no tenderness.  LUNGS: Diffuse expiratory wheezes with occasional rhonchi with tight expiratory airflow and harsh vesicular breathing. CARDIOVASCULAR: Regular rate and rhythm, S1, S2 normal. No murmurs, rubs, or gallops.  ABDOMEN: Soft, nondistended, nontender. Bowel sounds present. No organomegaly or mass.  EXTREMITIES: No pedal edema, cyanosis, or clubbing.  NEUROLOGIC: Cranial nerves II through XII are intact. Muscle strength 5/5 in all extremities. Sensation intact. Gait not checked.  PSYCHIATRIC: The patient is alert and oriented x 3.  Normal affect and good eye contact. SKIN: No obvious rash, lesion, or ulcer.   LABORATORY PANEL:   CBC Recent Labs  Lab 05/11/22 1922  WBC 8.3  HGB 18.0*  HCT 54.3*  PLT 102*   ------------------------------------------------------------------------------------------------------------------  Chemistries  Recent Labs  Lab 05/11/22 1922  NA 138  K 4.2  CL 112*  CO2 22  GLUCOSE 108*  BUN 30*  CREATININE 1.37*  CALCIUM 9.4   ------------------------------------------------------------------------------------------------------------------  Cardiac Enzymes No results for input(s): "TROPONINI" in the last 168 hours. ------------------------------------------------------------------------------------------------------------------  RADIOLOGY:  CT Angio Chest PE W and/or Wo Contrast  Result Date: 05/11/2022 CLINICAL DATA:  Cough, shortness of breath, high clinical suspicion for PE EXAM: CT ANGIOGRAPHY CHEST WITH CONTRAST TECHNIQUE: Multidetector CT imaging of the chest was performed using the standard protocol during bolus administration of intravenous contrast. Multiplanar CT image reconstructions and MIPs were obtained to evaluate the vascular anatomy. RADIATION DOSE REDUCTION: This exam was performed according to the departmental dose-optimization program which includes automated exposure control, adjustment of the mA and/or kV  according to patient size and/or use of iterative reconstruction technique. CONTRAST:  50m OMNIPAQUE IOHEXOL 350 MG/ML SOLN COMPARISON:  Previous chest radiograph done earlier today FINDINGS: Cardiovascular: There are no intraluminal filling defects in central pulmonary artery branches. Evaluation of small peripheral subsegmental branches is limited by motion artifacts. Contrast density in thoracic aorta is less than adequate to evaluate the lumen for dissection. Scattered calcifications are seen in thoracic aorta. Fairly extensive coronary artery calcifications are seen. Pacer leads are noted in place. Pacemaker battery is seen in the left infraclavicular region. Mediastinum/Nodes: No significant lymphadenopathy seen. Lungs/Pleura: There is no focal pulmonary consolidation. Small linear densities in the posterior lower lung fields may suggest scarring or subsegmental atelectasis. There is no pleural effusion or pneumothorax. Upper Abdomen: Scattered diverticula are seen in the visualized  portions of colon. Small hiatal hernia is seen. Musculoskeletal: Unremarkable. Review of the MIP images confirms the above findings. IMPRESSION: There is no evidence of pulmonary artery embolism. Coronary artery disease. There is no focal pulmonary consolidation. There is no pleural effusion or pneumothorax. Small hiatal hernia.  Diverticulosis of colon. Electronically Signed   By: Elmer Picker M.D.   On: 05/11/2022 20:42   DG Chest Portable 1 View  Result Date: 05/11/2022 CLINICAL DATA:  Shortness of breath. EXAM: PORTABLE CHEST 1 VIEW COMPARISON:  October 12, 2021 FINDINGS: There is a dual lead AICD. The cardiac silhouette is moderately enlarged and unchanged in size. Marked severity calcification of the aortic arch is seen. Mild, stable, chronic appearing increased interstitial lung markings are noted. Very mild atelectatic changes are seen within the bilateral lung bases. There is no evidence of a pleural effusion or  pneumothorax. The visualized skeletal structures are unremarkable. IMPRESSION: Chronic appearing increased interstitial lung markings with very mild bibasilar atelectasis. Electronically Signed   By: Virgina Norfolk M.D.   On: 05/11/2022 19:34      IMPRESSION AND PLAN:  Assessment and Plan: * COPD exacerbation ([place]) - The patient will be admitted to a medically monitored bed. - We will place the patient IV steroid therapy with IV Solu-Medrol as well as nebulized bronchodilator therapy with duonebs q.i.d. and q.4 hours p.r.n.[place] Kitchen - Mucolytic therapy will be provided with Mucinex and antibiotic therapy with IV Rocephin. - O2 protocol will be followed.    History of pulmonary embolus (PE) - We will continue Eliquis.  Essential (primary) hypertension .-BP is borderline.  We will hold off antihypertensives if hypotensive.  Chronic systolic CHF (congestive heart failure) (HCC) - We will continue Entresto, Aldactone, Demadex, Jardiance and Zebeta.  Dyslipidemia - We will continue statin therapy.  Type 2 diabetes mellitus without complication ([place]) - The patient will be placed on supplement coverage with NovoLog.  We will continue Jardiance       DVT prophylaxis: Eliquis Advanced Care Planning:  Code Status: full code. Family Communication:  The plan of care was discussed in details with the patient (and family). I answered all questions. The patient agreed to proceed with the above mentioned plan. Further management will depend upon hospital course. Disposition Plan: Back to previous home environment Consults called: none. All the records are reviewed and case discussed with ED provider.  Status is: Inpatient    At the time of the admission, it appears that the appropriate admission status for this patient is inpatient.  This is judged to be reasonable and necessary in order to provide the required intensity of service to ensure the patient's safety given the presenting symptoms,  physical exam findings and initial radiographic and laboratory data in the context of comorbid conditions.  The patient requires inpatient status due to high intensity of service, high risk of further deterioration and high frequency of surveillance required.  I certify that at the time of admission, it is my clinical judgment that the patient will require inpatient hospital care extending more than 2 midnights.                            Dispo: The patient is from: Home              Anticipated d/c is to: Home              Patient currently is not medically stable to d/c.  Difficult to place patient: No  Christel Mormon M.D on 05/12/2022 at 1:47 AM  [facility]   From 7 PM-7 AM, contact night-coverage [email or web address]  CC: Primary care physician; Lottie Mussel, MD

## 2022-05-11 NOTE — ED Provider Notes (Signed)
[facility] Provider Note    Event Date/Time   First MD Initiated Contact with Patient 05/11/22 1935     (approximate)   History   Chief Complaint: Shortness of Breath   HPI  Paul Bullock is a 76 y.o. male with a history of CHF, COPD, pulmonary emboli on Eliquis who comes ED complaining of shortness of breath congestion and cough for the past 3 days, gradual onset and worsening.  No chest pain.  No fever.  He has been compliant with his Eliquis, but did make a driving trip to [place] within the past week.     Physical Exam   Triage Vital Signs: ED Triage Vitals  Enc Vitals Group     BP 05/11/22 1915 123/71     Pulse Rate 05/11/22 1915 65     Resp 05/11/22 1915 (!) 26     Temp 05/11/22 1915 98.5 F (36.9 C)     Temp Source 05/11/22 1915 Oral     SpO2 05/11/22 1915 91 %     Weight 05/11/22 1917 244 lb 6.4 oz (110.9 kg)     Height 05/11/22 1917 '5\' 10"'$  (1.778 m)     Head Circumference --      Peak Flow --      Pain Score 05/11/22 1917 0     Pain Loc --      Pain Edu? --      Excl. in [place]? --     Most recent vital signs: Vitals:   05/11/22 2157 05/11/22 2158  BP:    Pulse: 89 90  Resp:  20  Temp:    SpO2: 95% 92%    General: Awake, no distress.  CV:  Good peripheral perfusion.  Regular rate and rhythm Resp:  Normal effort.  Diffuse expiratory wheezing and prolonged expiratory phase Abd:  No distention.  Soft nontender Other:  No JVD, no lower extremity edema or calf tenderness.   ED Results / Procedures / Treatments   Labs (all labs ordered are listed, but only abnormal results are displayed) Labs Reviewed  BASIC METABOLIC PANEL - Abnormal; Notable for the following components:      Result Value   Chloride 112 (*)    Glucose, Bld 108 (*)    BUN 30 (*)    Creatinine, Ser 1.37 (*)    GFR, Estimated 54 (*)    Anion gap 4 (*)    All other components within normal limits  CBC - Abnormal; Notable for the following  components:   Hemoglobin 18.0 (*)    HCT 54.3 (*)    Platelets 102 (*)    All other components within normal limits  SARS CORONAVIRUS 2 BY RT PCR  TROPONIN I (HIGH SENSITIVITY)  TROPONIN I (HIGH SENSITIVITY)     EKG Interpreted by me Sinus rhythm rate of 84.  Normal axis, normal intervals.  Poor R wave progression.  Normal ST segments and T waves.  No ischemic changes.   RADIOLOGY Chest x-ray interpreted by me, negative for edema effusion or consolidation.  Radiology report reviewed.  CT angiogram chest negative for PE or other acute findings.   PROCEDURES:  Procedures   MEDICATIONS ORDERED IN ED: Medications  cefTRIAXone (ROCEPHIN) 2 g in sodium chloride 0.9 % 100 mL IVPB (has no administration in time range)  azithromycin (ZITHROMAX) 500 mg in sodium chloride 0.9 % 250 mL IVPB (has no administration in time range)  methylPREDNISolone sodium succinate (SOLU-MEDROL) 125 mg/2 mL injection  125 mg (125 mg Intravenous Given 05/11/22 2033)  ipratropium-albuterol (DUONEB) 0.5-2.5 (3) MG/3ML nebulizer solution 3 mL (3 mLs Nebulization Given 05/11/22 2033)  albuterol (PROVENTIL) (2.5 MG/3ML) 0.083% nebulizer solution 5 mg (5 mg Nebulization Given 05/11/22 2033)  iohexol (OMNIPAQUE) 350 MG/ML injection 75 mL (75 mLs Intravenous Contrast Given 05/11/22 2022)     IMPRESSION / MDM / ASSESSMENT AND PLAN / ED COURSE  I reviewed the triage vital signs and the nursing notes.                              Differential diagnosis includes, but is not limited to, COPD exacerbation, pneumonia, pleural effusion, pulmonary edema, pulmonary embolism, non-STEMI, viral illness  Patient's presentation is most consistent with acute presentation with potential threat to life or bodily function.  Patient presents with shortness of breath and cough, highest clinical suspicion for COPD exacerbation.  He has hypoxic respiratory failure as well with a room air oxygen saturation of 91%, requiring 2 L nasal  cannula.  Due to his recent trip and recurrent PE history, CTA is required to rule out PE which is negative today.  After steroids and bronchodilators, patient reports symptomatic improvement but still has prolonged expiratory phase and wheezing, and room air oxygen saturation at rest is 89%.  Case discussed with hospitalist for further management of his hypoxia and COPD exacerbation.      FINAL CLINICAL IMPRESSION(S) / ED DIAGNOSES   Final diagnoses:  COPD exacerbation ([place])  Acute respiratory failure with hypoxia ([place])     Rx / DC Orders   ED Discharge Orders     None        Note:  This document was prepared using Dragon voice recognition software and may include unintentional dictation errors.   Carrie Mew, MD 05/12/22 [phone]

## 2022-05-11 NOTE — ED Notes (Signed)
Pt placed on 2L [place]

## 2022-05-11 NOTE — ED Notes (Signed)
Patient is being discharged from the Urgent Care and sent to the Emergency Department via personal vehicle . Per Barkley Boards NP, patient is in need of higher level of care due to SOB, history of pulmonary embolism. Patient is aware and verbalizes understanding of plan of care.  Vitals:   05/11/22 1820 05/11/22 1826  BP: 127/75   Pulse: 89 82  Resp: (!) 24 (!) 21  Temp: 98.4 F (36.9 C)   SpO2: 90% 90%

## 2022-05-11 NOTE — ED Provider Notes (Signed)
Paul Bullock    CSN: [id number] Arrival date & time: 05/11/22  1814      History   Chief Complaint Chief Complaint  Patient presents with   Nasal Congestion    Sore throat, chest congestion, cough - Entered by patient   Cough   Shortness of Breath   Sore Throat    HPI Paul Bullock is a 76 y.o. male.  Patient presents with acute shortness of breath and productive cough x4 days which is worse today.  He recently drove 10 hours in his car from [facility].  Treatment at home with albuterol nebulizer.  He also reports chest congestion and sore throat.  No chest pain, fever, rash, vomiting, diarrhea, or other symptoms.  His medical history includes pulmonary embolism, DVT, COPD, heart failure, hypertension, MI, cardiomyopathy, implantable cardiac defibrillator, diabetes, morbid obesity.    The history is provided by the patient and medical records.    Past Medical History:  Diagnosis Date   AICD (automatic cardioverter/defibrillator) present    a. 11/2014 s/p MDT DDMB 1D4 [facility] MRI XT DR AICD (ser# [id number]).   Bladder cancer ([place])    a. 2013 s/p chemo/xrt.   CAD (coronary artery disease)    a. 11/2020 s/p MI/cardiac arrest-->DES to LCX x 1; b. 10/2011 Cath: LAD 7mw/ aneurysmal post-stenotic dilation-->med rx. LCX 50d to prior stent; c. 10/2021 NSTEMI/Cath: LM 30ost/m, LAD 90p/m, 976mLCX 20p/m, patent stent, LPAV 60, RCA mild diff dzs, EF<25%, LVEDP 4031m.   Chronic HFrEF (heart failure with reduced ejection fraction) (HCC)    a. EF prev as low as 20% w/ subsequent improvement to 50-55%; b. 11/2020 limited echo: "windows were challenging...LVEF largely preserved."; c. 10/2021 LV gram: EF <25%, LVEDP 93m57m   COPD (chronic obstructive pulmonary disease) (HCC)    Depression    Diverticulitis    DVT (deep venous thrombosis) (HCC)[place] Essential hypertension    Hyperlipidemia LDL goal <70    Ischemic cardiomyopathy    a. EF prev as low as 20%; b. 11/2014 s/p MDT [id number] MRI XT DR AICD; c. subsequent improvement in EF to 50-55%; d. 11/2020 limited echo: "windows were challenging...LVEF largely preserved."; e. 10/2021 LV Gram: EF <25%.   MI (myocardial infarction) (HCC)[place] Migraines    Morbid obesity (HCC)[place] Pulmonary embolism (unprovoked)    a. x 2 -->chronic eliquis.   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   Colon polyps 12/05/2021   Acrochordon 12/05/2021   Thrombocytopenia (HCC)[place]/05/2022   Polycythemia 10/13/2021   History of pulmonary embolus (PE) 10/12/2021   Rectal bleeding 10/12/2021   Atherosclerotic heart disease of native coronary artery without angina pectoris 01/06/2021   Essential (primary) hypertension 01/06/2021   Hyperlipidemia 01/06/2021   Type 2 diabetes mellitus without complication (HCC)[place]/069/[id number]'s syndrome 01/06/2021   Ischemic cardiomyopathy 01/06/2021   Tobacco dependence 04/11/2018   History of bladder cancer 03/11/2018    Past Surgical History:  Procedure Laterality Date  CARDIAC DEFIBRILLATOR PLACEMENT     CORONARY STENT INTERVENTION N/A 10/18/2021   Procedure: CORONARY STENT INTERVENTION;  Surgeon: [name], MD;  Location: [facility];  Service: Cardiovascular;  Laterality: N/A;  Prox and Mid LAD   MOHS SURGERY     PERCUTANEOUS CORONARY STENT INTERVENTION (PCI-S)     RIGHT/LEFT HEART CATH AND CORONARY ANGIOGRAPHY N/A 10/13/2021   Procedure: RIGHT/LEFT HEART CATH AND CORONARY ANGIOGRAPHY;   Surgeon: [name], MD;  Location: [facility];  Service: Cardiovascular;  Laterality: N/A;   VASECTOMY     VASECTOMY REVERSAL         Home Medications    Prior to Admission medications   Medication Sig Start Date End Date Taking? Authorizing Provider  albuterol (PROVENTIL) (2.5 MG/3ML) 0.083% nebulizer solution Take 3 mLs (2.5 mg total) by nebulization every 6 (six) hours as needed for wheezing or shortness of breath. 01/16/22 01/11/23  Lottie Mussel, MD  apixaban (ELIQUIS) 2.5 MG TABS tablet Take 1 tablet (2.5 mg total) by mouth 2 (two) times daily. 11/14/21   Deboraha Sprang, MD  bisoprolol (ZEBETA) 5 MG tablet Take 1.5 tablets (7.5 mg total) by mouth daily. 03/20/22   Larey Dresser, MD  Cholecalciferol (VITAMIN D-3) 125 MCG (5000 UT) TABS Take 5,000 Units by mouth daily.    [provider]  clopidogrel (PLAVIX) 75 MG tablet Take 1 tablet (75 mg total) by mouth daily. 11/01/21   Larey Dresser, MD  Cyanocobalamin 2500 MCG TABS Take 2,500 mcg by mouth daily.    [provider]  empagliflozin (JARDIANCE) 10 MG TABS tablet Take 1 tablet (10 mg total) by mouth daily. 11/01/21   Larey Dresser, MD  fluticasone [facility]) 50 MCG/ACT nasal spray Place 2 sprays into both nostrils daily. As  needed    [provider]  Multiple Vitamins-Minerals (MULTIVITAMIN WITH MINERALS) tablet Take 1 tablet by mouth daily.    [provider]  nitroGLYCERIN (NITROSTAT) 0.4 MG SL tablet Place 1 tablet (0.4 mg total) under the tongue every 5 (five) minutes as needed for chest pain. 11/01/21   Larey Dresser, MD  nystatin (MYCOSTATIN) 100000 UNIT/ML suspension Take 2 mLs (200,000 Units total) by mouth 4 (four) times daily. 11/01/21   Larey Dresser, MD  Pitavastatin Calcium (LIVALO) 4 MG TABS Take 1 tablet (4 mg total) by mouth daily. 10/27/21   Larey Dresser, MD  sacubitril-valsartan (ENTRESTO) 49-51 MG Take 1 tablet by mouth 2 (two) times daily. 03/20/22    Larey Dresser, MD  spironolactone (ALDACTONE) 25 MG tablet Take 1 tablet (25 mg total) by mouth daily. 11/01/21   Larey Dresser, MD  torsemide (DEMADEX) 20 MG tablet Take 0.5 tablets (10 mg total) by mouth every other day. Take an extra half tablet PRN for weight gain/edema. (3lbs in 1 day or 5 lbs in 1 week) 12/15/21   [place], Maricela Bo, FNP    Family History Family History  Problem Relation Age of Onset   Stroke Mother    Bladder Cancer Father    Heart attack Brother     Social History Social History   Tobacco Use   Smoking status: Every Day    Packs/day: 0.75    Years: 55.00    Total pack years: 41.25    Types: Cigarettes   Smokeless tobacco: Never   Tobacco comments:    10 cigs per day  Vaping Use   Vaping Use: Never used  Substance Use Topics   Alcohol use: Never   Drug use: Never     Allergies   Atorvastatin and Rosuvastatin   Review of Systems Review of Systems  Constitutional:  Negative for chills and fever.  HENT:  Positive for congestion and sore throat. Negative for ear pain.   Respiratory:  Positive for cough and shortness of breath.   Cardiovascular:  Negative for chest pain and palpitations.  Gastrointestinal:  Negative for diarrhea and vomiting.  All other systems reviewed and are negative.    Physical Exam Triage Vital Signs ED Triage Vitals  Enc Vitals Group     BP 05/11/22 1820 127/75     Pulse Rate 05/11/22 1820 89     Resp 05/11/22 1820 (!) 24     Temp 05/11/22 1820 98.4 F (36.9 C)     Temp src --      SpO2 05/11/22 1820 90 %     Weight --      Height --      Head Circumference --      Peak Flow --      Pain Score 05/11/22 1823 0     Pain Loc --      Pain Edu? --      Excl. in [place]? --    No data found.  Updated Vital Signs BP 127/75   Pulse 82   Temp 98.4 F (36.9 C)   Resp (!) 21   SpO2 90%   Visual Acuity Right Eye Distance:   Left Eye Distance:   Bilateral Distance:    Right Eye Near:   Left Eye Near:     Bilateral Near:     Physical Exam Vitals and nursing note reviewed.  Constitutional:      General: He is not in acute distress.    Appearance: He is obese. He is ill-appearing.  HENT:     Mouth/Throat:     Mouth: Mucous membranes are moist.  Cardiovascular:     Rate and Rhythm: Normal rate and regular rhythm.     Heart sounds: Normal heart sounds.  Pulmonary:     Effort: Tachypnea, accessory muscle usage and prolonged expiration present. No respiratory distress.     Breath sounds: Wheezing and rhonchi present.     Comments: Decreased air movement, scattered wheezes and rhonchi.  Musculoskeletal:     Cervical back: Neck supple.  Skin:    General: Skin is warm and dry.  Neurological:     Mental Status: He is alert.  Psychiatric:        Mood and Affect: Mood normal.        Behavior: Behavior normal.      UC Treatments / Results  Labs (all labs ordered are listed, but only abnormal results are displayed) Labs Reviewed - No data to display  EKG   Radiology No results found.  Procedures Procedures (including critical care time)  Medications Ordered in UC Medications - No data to display  Initial Impression / Assessment and Plan / UC Course  I have reviewed the triage vital signs and the nursing notes.  Pertinent labs & imaging results that were available during my care of the patient were reviewed by me and considered in my medical decision making (see chart for details).   Shortness of breath, productive cough, history of pulmonary embolism, COPD.  Patient is tachypneic.  O2 sat 90% on room air.  He has a history of a pulmonary embolism he recently drove 10 hours  from [place].  Sending him to the ED for evaluation.  He refuses EMS and states he is stable to drive himself to the ED at [facility].   Final Clinical Impressions(s) / UC Diagnoses   Final diagnoses:  Shortness of breath  Productive cough  History of pulmonary embolism  Chronic obstructive pulmonary  disease, unspecified COPD type [facility])     Discharge Instructions      Go to the emergency department for evaluation of your shortness of breath and productive cough.       ED Prescriptions   None    PDMP not reviewed this encounter.   Sharion Balloon, NP 05/11/22 1845

## 2022-05-11 NOTE — Discharge Instructions (Addendum)
Go to the emergency department for evaluation of your shortness of breath and productive cough.

## 2022-05-11 NOTE — ED Triage Notes (Signed)
Pt presents with cough, runny nose, SOB, and sore throat x 4 days. His symptoms returned after traveling form [facility]. Pt has COPD and states he had a nebulizer treatment appx 5:30pm today.

## 2022-05-11 NOTE — ED Triage Notes (Signed)
Pt reports SOB and increased work of breathing, onset Wednesday. He reports having cold like symptoms on Tuesday (congestion, sore throat). Home covid test was negative. Hx of CHF and PE. Denies CP. He reports he drove from [facility] on Sunday. Pt tachypneic at triage.

## 2022-05-12 DIAGNOSIS — E785 Hyperlipidemia, unspecified: Secondary | ICD-10-CM | POA: Diagnosis present

## 2022-05-12 DIAGNOSIS — J9601 Acute respiratory failure with hypoxia: Secondary | ICD-10-CM | POA: Diagnosis present

## 2022-05-12 DIAGNOSIS — N1831 Chronic kidney disease, stage 3a: Secondary | ICD-10-CM | POA: Diagnosis present

## 2022-05-12 DIAGNOSIS — J441 Chronic obstructive pulmonary disease with (acute) exacerbation: Secondary | ICD-10-CM | POA: Diagnosis not present

## 2022-05-12 DIAGNOSIS — E669 Obesity, unspecified: Secondary | ICD-10-CM | POA: Diagnosis present

## 2022-05-12 DIAGNOSIS — I5022 Chronic systolic (congestive) heart failure: Secondary | ICD-10-CM | POA: Diagnosis present

## 2022-05-12 LAB — BASIC METABOLIC PANEL
Anion gap: 4 — ABNORMAL LOW (ref 5–15)
BUN: 25 mg/dL — ABNORMAL HIGH (ref 8–23)
CO2: 24 mmol/L (ref 22–32)
Calcium: 9.1 mg/dL (ref 8.9–10.3)
Chloride: 113 mmol/L — ABNORMAL HIGH (ref 98–111)
Creatinine, Ser: 1.19 mg/dL (ref 0.61–1.24)
GFR, Estimated: >60 mL/min (ref >60)
Glucose, Bld: 143 mg/dL — ABNORMAL HIGH (ref 70–99)
Potassium: 4.6 mmol/L (ref 3.5–5.1)
Sodium: 141 mmol/L (ref 135–145)

## 2022-05-12 LAB — CBG MONITORING, ED
Glucose-Capillary: 152 mg/dL — ABNORMAL HIGH (ref 70–99)
Glucose-Capillary: 157 mg/dL — ABNORMAL HIGH (ref 70–99)
Glucose-Capillary: 158 mg/dL — ABNORMAL HIGH (ref 70–99)

## 2022-05-12 LAB — CBC
HCT: 51.4 % (ref 39.0–52.0)
Hemoglobin: 17 g/dL (ref 13.0–17.0)
MCH: 31.3 pg (ref 26.0–34.0)
MCHC: 33.1 g/dL (ref 30.0–36.0)
MCV: 94.5 fL (ref 80.0–100.0)
Platelets: 107 10*3/uL — ABNORMAL LOW (ref 150–400)
RBC: 5.44 MIL/uL (ref 4.22–5.81)
RDW: 13.1 % (ref 11.5–15.5)
WBC: 10.8 10*3/uL — ABNORMAL HIGH (ref 4.0–10.5)
nRBC: 0 % (ref 0.0–0.2)

## 2022-05-12 LAB — GLUCOSE, CAPILLARY: Glucose-Capillary: 114 mg/dL — ABNORMAL HIGH (ref 70–99)

## 2022-05-12 MED ORDER — AZITHROMYCIN 500 MG PO TABS
250.0000 mg | ORAL_TABLET | Freq: Every day | ORAL | Status: DC
  Administered 2022-05-13: 250 mg via ORAL
  Filled 2022-05-12: qty 1

## 2022-05-12 MED ORDER — INSULIN ASPART 100 UNIT/ML IJ SOLN
0.0000 [IU] | Freq: Three times a day (TID) | INTRAMUSCULAR | Status: DC
  Administered 2022-05-12 (×3): 4 [IU] via SUBCUTANEOUS
  Filled 2022-05-12 (×4): qty 1

## 2022-05-12 MED ORDER — ALBUTEROL SULFATE (2.5 MG/3ML) 0.083% IN NEBU: 2.5000 mg | INHALATION_SOLUTION | RESPIRATORY_TRACT | Status: DC | PRN

## 2022-05-12 MED ORDER — IPRATROPIUM-ALBUTEROL 0.5-2.5 (3) MG/3ML IN SOLN
3.0000 mL | Freq: Four times a day (QID) | RESPIRATORY_TRACT | Status: DC
  Administered 2022-05-12 – 2022-05-13 (×4): 3 mL via RESPIRATORY_TRACT
  Filled 2022-05-12 (×4): qty 3

## 2022-05-12 MED ORDER — AZITHROMYCIN 500 MG PO TABS
500.0000 mg | ORAL_TABLET | Freq: Every day | ORAL | Status: AC
  Administered 2022-05-12: 500 mg via ORAL
  Filled 2022-05-12: qty 1

## 2022-05-12 NOTE — Assessment & Plan Note (Addendum)
Patient euvolemic, well compensated. --Continue Entresto, Aldactone, Demadex, Jardiance and Zebeta.

## 2022-05-12 NOTE — Assessment & Plan Note (Signed)
Due to COPD with acute exacerbation.  Patient presented with progressively worsening dyspnea, O2 sats on room air were in the upper 80s, improved to mid 90s on 2 L nasal cannula O2. 10/7: RN attempted to wean patient he desatted to the 80's on room air 10/8: weaned to room air, stable O2 sats at rest and ambulating around the unit -- Incentive spirometer -- Management of COPD exacerbation as outlined

## 2022-05-12 NOTE — Hospital Course (Signed)
Paul Bullock is a 76 y.o. Caucasian male with medical history significant for CAD, chronic HFrEF, COPD not on home oxygen, depression, PE/DVT, HTN, HLD, ischemic cardiomyopathy, migraine, , who presented to the ER on evening of Friday 05/11/2022 for evaluation of progressively worsening dyspnea with associated cough productive of clear sputum that later on was purulent as well as wheezing since Tuesday with no fever or chills.   In the ED, pt was tachypneic and hypoxic with spO2 88-90% on room air, improved on 2 L/min.  Chest xray and CTA chest were unremarkable, without evidence of PE, pneumonia or other acute findings.  Pt was admitted for further management of COPD with acute exacerbation complicated by acute respiratory failure with hypoxia.

## 2022-05-12 NOTE — Assessment & Plan Note (Addendum)
Continue statin. 

## 2022-05-12 NOTE — Assessment & Plan Note (Addendum)
Antihypertensives held on admission due to borderline BPs.  Monitor closely and resume when indicated

## 2022-05-12 NOTE — Assessment & Plan Note (Addendum)
Presented with worsening dyspnea and cough, found to be hypoxic requiring supplemental O2. -- Treated with IV steroids, scheduled DuoNebs, PRN albuterol, PO Zithromax, scheduled Mucinex, supplemental O2 --Discharge on PO prednisone taper, remainder of Z-pack --As needed albuterol nebs --Resume home regimen

## 2022-05-12 NOTE — Assessment & Plan Note (Addendum)
Continue Eliquis. CTA chest without acute PE.

## 2022-05-12 NOTE — Progress Notes (Addendum)
Progress Note   Patient: Paul Bullock BJS:[id number] DOB: [birth year] DOA: 05/11/2022     1 DOS: the patient was seen and examined on 05/12/2022   Brief hospital course: Paul Bullock is a 76 y.o. Caucasian male with medical history significant for CAD, chronic HFrEF, COPD not on home oxygen, depression, PE/DVT, HTN, HLD, ischemic cardiomyopathy, migraine, , who presented to the ER on evening of Friday 05/11/2022 for evaluation of progressively worsening dyspnea with associated cough productive of clear sputum that later on was purulent as well as wheezing since Tuesday with no fever or chills.   In the ED, pt was tachypneic and hypoxic with spO2 88-90% on room air, improved on 2 L/min.  Chest xray and CTA chest were unremarkable, without evidence of PE, pneumonia or other acute findings.  Pt was admitted for further management of COPD with acute exacerbation complicated by acute respiratory failure with hypoxia.    Assessment and Plan: * COPD exacerbation ([place]) Presented with worsening dyspnea and cough, found to be hypoxic requiring supplemental O2. -- Continue IV steroids --Scheduled DuoNebs --As needed albuterol nebs --Stop Rocephin --Start oral Zithromax --Scheduled Mucinex - O2 per protocol    Acute respiratory failure with hypoxia (HCC) Due to COPD with acute exacerbation.  Patient presented with progressively worsening dyspnea, O2 sats on room air were in the upper 80s, improved to mid 90s on 2 L nasal cannula O2. 10/7: RN attempted to wean patient he desatted to the 80's on room air -- Continue supplemental O2, maintain sats 88 to 94%, wean as tolerated -- Incentive spirometer -- Management of COPD exacerbation as outlined  Chronic systolic CHF (congestive heart failure) (HCC) Patient euvolemic, well compensated. --Continue Entresto, Aldactone, Demadex, Jardiance and Zebeta.  Chronic kidney disease, stage 3a ([place]) Renal function at baseline, creatinine 1.37 on admission.   Monitor BMP   History of pulmonary embolus (PE) Continue Eliquis. CTA chest without acute PE.  Essential (primary) hypertension Antihypertensives held on admission due to borderline BPs.  Monitor closely and resume when indicated  Obesity (BMI 30-39.9) Body mass index is 35.07 kg/m. Complicates overall care and prognosis.  Recommend lifestyle modifications including physical activity and diet for weight loss and overall long-term health.   Dyslipidemia Continue statin  Type 2 diabetes mellitus without complication ([place]) Continue Jardiance.  Supplemental sliding scale NovoLog.        Subjective: Patient seen in the ED this morning, holding for bed.  He reports feeling much better but still has significant conversational dyspnea.  He would like to go home today but still requiring oxygen.  RN reported trying to wean him to room air earlier and he desatted to the mid 80s.  He is reluctantly agreeable to stay another night at least.  He is requesting breathing treatment and noticed only single DuoNeb was ordered, placed order for scheduled DuoNebs and as needed albuterol.   Physical Exam: Vitals:   05/12/22 1034 05/12/22 1035 05/12/22 1200 05/12/22 1325  BP:   129/82   Pulse:   76   Resp:   (!) 24   Temp:    97.9 F (36.6 C)  TempSrc:    Oral  SpO2: (!) 88% 93% 98%   Weight:      Height:       General exam: awake, alert, no acute distress HEENT: Cannula in place moist mucus membranes, hearing grossly normal  Respiratory system: CTAB with fair air movement, not actively wheezing, significant conversational dyspnea, some accessory muscle use, on 2 L/min  [place] at. Cardiovascular system: normal S1/S2, RRR, no pedal edema.   Gastrointestinal system: soft, NT, ND Central nervous system: A&O x4. no gross focal neurologic deficits, normal speech Extremities: moves all, no edema, normal tone Skin: dry, intact, normal temperature Psychiatry: normal mood, congruent affect, judgement  and insight appear normal    Data Reviewed:  Notable labs: Chloride 113, glucose 143, BUN 25, anion gap 4, creatinine normalized 1.37 down to 1.19, mild leukocytosis 10.8, mild thrombocytopenia 107 from 102  Family Communication: None present bedside  Disposition: Status is: Inpatient Remains inpatient appropriate because: Still requiring supplemental oxygen remains on IV steroids pending further clinical improvement.  Anticipate discharge in 24 to 48 hours   Planned Discharge Destination: Home    Time spent: 45 minutes  Author: Ezekiel Slocumb, DO 05/12/2022 3:30 PM  For on call review [email or web address].

## 2022-05-12 NOTE — Assessment & Plan Note (Signed)
Body mass index is 35.07 kg/m. Complicates overall care and prognosis.  Recommend lifestyle modifications including physical activity and diet for weight loss and overall long-term health.

## 2022-05-12 NOTE — Assessment & Plan Note (Addendum)
Continue Jardiance.  Supplemental sliding scale NovoLog.

## 2022-05-12 NOTE — ED Provider Notes (Incomplete)
[facility] Provider Note    Event Date/Time   First MD Initiated Contact with Patient 05/11/22 1935     (approximate)   History   Chief Complaint: Shortness of Breath   HPI  Paul Bullock is a 76 y.o. male with a history of CHF, COPD, pulmonary emboli on Eliquis who comes ED complaining of shortness of breath congestion and cough for the past 3 days, gradual onset and worsening.  No chest pain.  No fever.  He has been compliant with his Eliquis, but did make a driving trip to [place] within the past week.     Physical Exam   Triage Vital Signs: ED Triage Vitals  Enc Vitals Group     BP 05/11/22 1915 123/71     Pulse Rate 05/11/22 1915 65     Resp 05/11/22 1915 (!) 26     Temp 05/11/22 1915 98.5 F (36.9 C)     Temp Source 05/11/22 1915 Oral     SpO2 05/11/22 1915 91 %     Weight 05/11/22 1917 244 lb 6.4 oz (110.9 kg)     Height 05/11/22 1917 '5\' 10"'$  (1.778 m)     Head Circumference --      Peak Flow --      Pain Score 05/11/22 1917 0     Pain Loc --      Pain Edu? --      Excl. in [place]? --     Most recent vital signs: Vitals:   05/11/22 2157 05/11/22 2158  BP:    Pulse: 89 90  Resp:  20  Temp:    SpO2: 95% 92%    General: Awake, no distress.  CV:  Good peripheral perfusion.  Regular rate and rhythm Resp:  Normal effort.  Diffuse expiratory wheezing and prolonged expiratory phase Abd:  No distention.  Soft nontender Other:  No JVD, no lower extremity edema or calf tenderness.   ED Results / Procedures / Treatments   Labs (all labs ordered are listed, but only abnormal results are displayed) Labs Reviewed  BASIC METABOLIC PANEL - Abnormal; Notable for the following components:      Result Value   Chloride 112 (*)    Glucose, Bld 108 (*)    BUN 30 (*)    Creatinine, Ser 1.37 (*)    GFR, Estimated 54 (*)    Anion gap 4 (*)    All other components within normal limits  CBC - Abnormal; Notable for the following  components:   Hemoglobin 18.0 (*)    HCT 54.3 (*)    Platelets 102 (*)    All other components within normal limits  SARS CORONAVIRUS 2 BY RT PCR  TROPONIN I (HIGH SENSITIVITY)  TROPONIN I (HIGH SENSITIVITY)     EKG ***   RADIOLOGY ***   PROCEDURES:  Procedures   MEDICATIONS ORDERED IN ED: Medications  cefTRIAXone (ROCEPHIN) 2 g in sodium chloride 0.9 % 100 mL IVPB (has no administration in time range)  azithromycin (ZITHROMAX) 500 mg in sodium chloride 0.9 % 250 mL IVPB (has no administration in time range)  methylPREDNISolone sodium succinate (SOLU-MEDROL) 125 mg/2 mL injection 125 mg (125 mg Intravenous Given 05/11/22 2033)  ipratropium-albuterol (DUONEB) 0.5-2.5 (3) MG/3ML nebulizer solution 3 mL (3 mLs Nebulization Given 05/11/22 2033)  albuterol (PROVENTIL) (2.5 MG/3ML) 0.083% nebulizer solution 5 mg (5 mg Nebulization Given 05/11/22 2033)  iohexol (OMNIPAQUE) 350 MG/ML injection 75 mL (75 mLs Intravenous Contrast Given  05/11/22 2022)     IMPRESSION / MDM / ASSESSMENT AND PLAN / ED COURSE  I reviewed the triage vital signs and the nursing notes.                              Differential diagnosis includes, but is not limited to, ***  Patient's presentation is most consistent with {EM COPA:27473}  ***       FINAL CLINICAL IMPRESSION(S) / ED DIAGNOSES   Final diagnoses:  COPD exacerbation ([place])  Acute respiratory failure with hypoxia ([place])     Rx / DC Orders   ED Discharge Orders     None        Note:  This document was prepared using Dragon voice recognition software and may include unintentional dictation errors.

## 2022-05-12 NOTE — Assessment & Plan Note (Signed)
Renal function at baseline, creatinine 1.37 on admission.  Monitor BMP

## 2022-05-13 DIAGNOSIS — J441 Chronic obstructive pulmonary disease with (acute) exacerbation: Secondary | ICD-10-CM | POA: Diagnosis not present

## 2022-05-13 LAB — GLUCOSE, CAPILLARY: Glucose-Capillary: 125 mg/dL — ABNORMAL HIGH (ref 70–99)

## 2022-05-13 MED ORDER — AZITHROMYCIN 250 MG PO TABS: ORAL_TABLET | ORAL | 0 refills | Status: DC

## 2022-05-13 MED ORDER — PREDNISONE 10 MG PO TABS: ORAL_TABLET | ORAL | 0 refills | Status: AC

## 2022-05-13 NOTE — Progress Notes (Incomplete)
PCP: Lottie Mussel, MD Cardiology: Dr. Aundra Dubin  76 y.o. with history of COPD, DVT/PE, ischemic cardiomyopathy, and CAD presents for followup of CHF.  Patient suffered a myocardial infarction complicated by cardiac arrest in 2002 and required stenting of the obtuse marginal.  He notes that he subsequently had LV dysfunction.  In 2013, he required repeat diagnostic catheterization which revealed moderate disease distal to previously placed obtuse marginal stent with a 75% stenosis in the mid LAD with aneurysmal post stenotic dilation.  Per notes, he was medically managed.  In the setting of LV dysfunction with an EF as low as 20%, he underwent Medtronic AICD placement in April 2016, and up until 2022, he was followed closely by cardiology in [place], [place].  He did require admission to a hospital in [facility] in June 2002, in the setting of COPD exacerbation while visiting his son.  Per discharge summary, he was noted to have mild troponin elevation which was felt to be secondary to demand ischemia.   Paul Bullock moved to [place] in December 2022 and has been living locally.  His ex-wife lives nearby and they are close friends.  On October 12 2021, he developed recurrent chest discomfort and was admitted to [facility] with NSTEMI.  He underwent diagnostic catheterization at [facility] regional revealing severe proximal to mid LAD disease with otherwise nonobstructive disease and patent obtuse marginal stent.  His LVEDP was approximately 40 mmHg and ventriculogram showed an EF of less than 25%.  He was given Lasix in the lab and after discussion with advanced heart failure team at [facility], he was transferred for further evaluation and potential inotropic support.  At [facility], he was diuresed and started on GDMT.  He had DES x 2 to proximal and mid LAD and was discharged home.   Here today for f/u.   ***smoking, quit?  ECG (personally reviewed): ***  Medtronic ICD:****  Labs (3/23): K 4.6,  creatinine 1.02, LDL 59, hgb 17.7, plts 98K Labs (5/23): K 4.6, creatinine 1.44, LDL 56, JAK2 mutation negative, hgb 17.9, plts 123  PMH: 1. COPD: Quit smoking in 3/23.  2. DVT/PE 3. Chronic systolic CHF: Ischemic cardiomyopathy.  Medtronic ICD from 4/16.  - Echo (3/23): EF 20-25% - RHC (3/23): mean RA 25, PA 62/47, mean PCWP 39, CI 2.03.  4. CAD: MI with OM2 stent in 2002.  - NSTEMI 3/23 with DES x 2 to proximal and mid LAD.  5. 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  6. Bladder cancer 2012: Chemoradiation.  7. Type 2 DM 8. Polycythemia: Likely due to chronic smoking, JAK2 mutation negative.   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  Quit smoking in 6/23.  No ETOH.   Family History  Problem Relation Age of Onset   Stroke Mother    Bladder Cancer Father    Heart attack Brother    ROS: All systems reviewed and negative except as per HPI.   Current Outpatient Medications  Medication Sig Dispense Refill   albuterol (PROVENTIL) (2.5 MG/3ML) 0.083% nebulizer solution Take 3 mLs (2.5 mg total) by nebulization every 6 (six) hours as needed for wheezing or shortness of breath. 540 mL 3   apixaban (ELIQUIS) 2.5 MG TABS tablet Take 1 tablet (2.5 mg total) by mouth 2 (two) times daily. 180 tablet 3   azithromycin (ZITHROMAX) 250 MG tablet Take one tablet (250 mg) by mouth once daily for 3 days, starting tomorrow 10/9. 3 each 0   bisoprolol (ZEBETA)  5 MG tablet Take 1.5 tablets (7.5 mg total) by mouth daily. 135 tablet 3   Cholecalciferol (VITAMIN D-3) 125 MCG (5000 UT) TABS Take 5,000 Units by mouth daily.     clopidogrel (PLAVIX) 75 MG tablet Take 1 tablet (75 mg total) by mouth daily. 90 tablet 3   Cyanocobalamin 2500 MCG TABS Take 2,500 mcg by mouth daily.     empagliflozin (JARDIANCE) 10 MG TABS tablet Take 1 tablet (10 mg total) by mouth daily. 90 tablet 3   fluticasone (FLONASE) 50 MCG/ACT nasal spray Place 2 sprays into both nostrils daily. As  needed      Multiple Vitamins-Minerals (MULTIVITAMIN WITH MINERALS) tablet Take 1 tablet by mouth daily.     nitroGLYCERIN (NITROSTAT) 0.4 MG SL tablet Place 1 tablet (0.4 mg total) under the tongue every 5 (five) minutes as needed for chest pain. 25 tablet 3   nystatin (MYCOSTATIN) 100000 UNIT/ML suspension Take 2 mLs (200,000 Units total) by mouth 4 (four) times daily. 473 mL 0   Pitavastatin Calcium (LIVALO) 4 MG TABS Take 1 tablet (4 mg total) by mouth daily. 90 tablet 3   predniSONE (DELTASONE) 10 MG tablet Take 4 tablets (40 mg total) by mouth daily with breakfast for 1 day, THEN 3 tablets (30 mg total) daily with breakfast for 1 day, THEN 2 tablets (20 mg total) daily with breakfast for 1 day, THEN 1 tablet (10 mg total) daily with breakfast for 1 day. 10 tablet 0   sacubitril-valsartan (ENTRESTO) 49-51 MG Take 1 tablet by mouth 2 (two) times daily. 180 tablet 3   spironolactone (ALDACTONE) 25 MG tablet Take 1 tablet (25 mg total) by mouth daily. 90 tablet 3   torsemide (DEMADEX) 20 MG tablet Take 0.5 tablets (10 mg total) by mouth every other day. Take an extra half tablet PRN for weight gain/edema. (3lbs in 1 day or 5 lbs in 1 week) 15 tablet 3   No current facility-administered medications for this visit.   There were no vitals taken for this visit. General: NAD Neck: No JVD, no thyromegaly or thyroid nodule.  Lungs: Distant BS CV: Nondisplaced PMI.  Heart regular S1/S2, no S3/S4, no murmur.  1+ ankle edema.  No carotid bruit.  Normal pedal pulses.  Abdomen: Soft, nontender, no hepatosplenomegaly, no distention.  Skin: Intact without lesions or rashes.  Neurologic: Alert and oriented x 3.  Psych: Normal affect. Extremities: No clubbing or cyanosis.  HEENT: Normal.   Assessment/Plan: 1.  CAD: Patient has history of CAD with prior PCI to OM in 2002.  NSTEMI in 3/23 with cath showing complex proximal LAD disease with serial 90% stenoses separated by small aneurysmal areas.  He had a 60% mid LCx  stenosis.  RCA with mild disease.  He had DES x 2 to proximal and mid LAD. No chest pain.  - He will need to be on anticoagulation due to previous DVT/PE (apixaban), on plavix for at least 6 months, ideally 1 year. Not on ASA.  - Good lipids in 5/23 on Livalo.   2. Chronic systolic CHF: Medtronic ICD.  Ischemic cardiomyopathy.  Echo in 3/23 with EF 20-25%, severe LV dilation, RV mildly dysfunctional.  RHC in 3/23 with markedly elevated filling pressures, CI 2.03, and PAPI < 1. He is not volume overloaded on exam or by Optivol. NYHA class II.  - Continue torsemide 10 mg qod.  - Continue bisoprolol 5 mg daily (beta-1 selective with COPD).  - Continue Entresto 49/51 BID - Continue  empagliflozin 10 mg daily.  - Continue spironolactone 25 mg daily.  - QRS does not appear wide enough to benefit from CRT upgrade.   3. 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   - Continue apixaban, dose lowered to 2.5 mg bid while on Plavix.  4. COPD: He has quit smoking.  5. Thrombocytopenia: Mild, chronic.  6. Bladder cancer: Has been followed by cystoscopies   7. Polycythemia: JAK2 mutation negative, likely due to chronic smoking.   Followup ***  [facility], Margart Zemanek N 05/13/2022

## 2022-05-13 NOTE — Discharge Summary (Signed)
Physician Discharge Summary   Patient: Paul Bullock MRN: [id number] DOB: September 02, 1945  Admit date:     05/11/2022  Discharge date: 05/14/22  Discharge Physician: Paul Bullock   PCP: Paul Mussel, MD   Recommendations at discharge:    Follow up with Primary Care in 1-2 weeks Repeat BMP, CBC in 1-2 weeks Follow up on resolution of COPD exacerbation Follow up with specialist providers as scheduled  Discharge Diagnoses: Principal Problem:   COPD exacerbation ([place]) Active Problems:   Acute respiratory failure with hypoxia (HCC)   Chronic systolic CHF (congestive heart failure) (HCC)   History of pulmonary embolus (PE)   Chronic kidney disease, stage 3a ([place])   Essential (primary) hypertension   Type 2 diabetes mellitus without complication ([place])   Dyslipidemia   Obesity (BMI 30-39.9)   Hospital Course: Paul Bullock is a 76 y.o. Caucasian male with medical history significant for CAD, chronic HFrEF, COPD not on home oxygen, depression, PE/DVT, HTN, HLD, ischemic cardiomyopathy, migraine, , who presented to the ER on evening of Friday 05/11/2022 for evaluation of progressively worsening dyspnea with associated cough productive of clear sputum that later on was purulent as well as wheezing since Tuesday with no fever or chills.   In the ED, pt was tachypneic and hypoxic with spO2 88-90% on room air, improved on 2 L/min.  Chest xray and CTA chest were unremarkable, without evidence of PE, pneumonia or other acute findings.  Pt was admitted for further management of COPD with acute exacerbation complicated by acute respiratory failure with hypoxia.   Resolved  Acute respiratory failure with hypoxia   Assessment and Plan: * COPD exacerbation ([place]) Presented with worsening dyspnea and cough, found to be hypoxic requiring supplemental O2. -- Treated with IV steroids, scheduled DuoNebs, PRN albuterol, PO Zithromax, scheduled Mucinex, supplemental O2 --Discharge on PO prednisone  taper, remainder of Z-pack --As needed albuterol nebs --Resume home regimen    Acute respiratory failure with hypoxia (HCC) Due to COPD with acute exacerbation.  Patient presented with progressively worsening dyspnea, O2 sats on room air were in the upper 80s, improved to mid 90s on 2 L nasal cannula O2. 10/7: RN attempted to wean patient he desatted to the 80's on room air 10/8: weaned to room air, stable O2 sats at rest and ambulating around the unit -- Incentive spirometer -- Management of COPD exacerbation as outlined   Chronic systolic CHF (congestive heart failure) ([place]) Patient euvolemic, well compensated. --Continue Entresto, Aldactone, Demadex, Jardiance and Zebeta.  Chronic kidney disease, stage 3a ([place]) Renal function at baseline, creatinine 1.37 on admission.  Monitor BMP   History of pulmonary embolus (PE) Continue Eliquis. CTA chest without acute PE.  Essential (primary) hypertension Antihypertensives held on admission due to borderline BPs.  Monitor closely and resume when indicated  Obesity (BMI 30-39.9) Body mass index is 35.07 kg/m. Complicates overall care and prognosis.  Recommend lifestyle modifications including physical activity and diet for weight loss and overall long-term health.   Dyslipidemia Continue statin  Type 2 diabetes mellitus without complication ([place]) Continue Jardiance.  Supplemental sliding scale NovoLog.         Consultants: none Procedures performed: none   Disposition: Home  Diet recommendation:  Discharge Diet Orders (From admission, onward)     Start     Ordered   05/13/22 0000  Diet - low sodium heart healthy        05/13/22 1001           Cardiac and  Carb modified diet DISCHARGE MEDICATION: Allergies as of 05/13/2022       Reactions   Atorvastatin Other (See Comments)   Myalgias   Rosuvastatin Other (See Comments)   myalgias        Medication List     TAKE these medications    albuterol (2.5  MG/3ML) 0.083% nebulizer solution Commonly known as: PROVENTIL Take 3 mLs (2.5 mg total) by nebulization every 6 (six) hours as needed for wheezing or shortness of breath.   apixaban 2.5 MG Tabs tablet Commonly known as: ELIQUIS Take 1 tablet (2.5 mg total) by mouth 2 (two) times daily.   azithromycin 250 MG tablet Commonly known as: Zithromax Take one tablet (250 mg) by mouth once daily for 3 days, starting tomorrow 10/9.   bisoprolol 5 MG tablet Commonly known as: ZEBETA Take 1.5 tablets (7.5 mg total) by mouth daily.   clopidogrel 75 MG tablet Commonly known as: PLAVIX Take 1 tablet (75 mg total) by mouth daily.   Cyanocobalamin 2500 MCG Tabs Take 2,500 mcg by mouth daily.   empagliflozin 10 MG Tabs tablet Commonly known as: JARDIANCE Take 1 tablet (10 mg total) by mouth daily.   Entresto 49-51 MG Generic drug: sacubitril-valsartan Take 1 tablet by mouth 2 (two) times daily.   fluticasone 50 MCG/ACT nasal spray Commonly known as: FLONASE Place 2 sprays into both nostrils daily. As  needed   Livalo 4 MG Tabs Generic drug: Pitavastatin Calcium Take 1 tablet (4 mg total) by mouth daily.   multivitamin with minerals tablet Take 1 tablet by mouth daily.   nitroGLYCERIN 0.4 MG SL tablet Commonly known as: NITROSTAT Place 1 tablet (0.4 mg total) under the tongue every 5 (five) minutes as needed for chest pain.   nystatin 100000 UNIT/ML suspension Commonly known as: MYCOSTATIN Take 2 mLs (200,000 Units total) by mouth 4 (four) times daily.   predniSONE 10 MG tablet Commonly known as: DELTASONE Take 4 tablets (40 mg total) by mouth daily with breakfast for 1 day, THEN 3 tablets (30 mg total) daily with breakfast for 1 day, THEN 2 tablets (20 mg total) daily with breakfast for 1 day, THEN 1 tablet (10 mg total) daily with breakfast for 1 day. Start taking on: May 13, 2022   spironolactone 25 MG tablet Commonly known as: ALDACTONE Take 1 tablet (25 mg total) by  mouth daily.   torsemide 20 MG tablet Commonly known as: DEMADEX Take 0.5 tablets (10 mg total) by mouth every other day. Take an extra half tablet PRN for weight gain/edema. (3lbs in 1 day or 5 lbs in 1 week)   Vitamin D-3 125 MCG (5000 UT) Tabs Take 5,000 Units by mouth daily.        Discharge Exam: Filed Weights   05/11/22 1917  Weight: 110.9 kg   General exam: awake, alert, no acute distress HEENT: atraumatic, clear conjunctiva, anicteric sclera, moist mucus membranes, hearing grossly normal  Respiratory system: improved aeration, no expiratory wheezes, normal respiratory effort at rest, no conversational dyspnea. Cardiovascular system: normal S1/S2, RRR, no JVD, murmurs, rubs, gallops, no pedal edema.   Gastrointestinal system: soft, NT, ND, no HSM felt, +bowel sounds. Central nervous system: A&O x4. no gross focal neurologic deficits, normal speech Extremities: moves all, no edema, normal tone Skin: dry, intact, normal temperature Psychiatry: normal mood, congruent affect, judgement and insight appear normal   Condition at discharge: stable  The results of significant diagnostics from this hospitalization (including imaging, microbiology, ancillary and laboratory) are  listed below for reference.   Imaging Studies: CT Angio Chest PE W and/or Wo Contrast  Result Date: 05/11/2022 CLINICAL DATA:  Cough, shortness of breath, high clinical suspicion for PE EXAM: CT ANGIOGRAPHY CHEST WITH CONTRAST TECHNIQUE: Multidetector CT imaging of the chest was performed using the standard protocol during bolus administration of intravenous contrast. Multiplanar CT image reconstructions and MIPs were obtained to evaluate the vascular anatomy. RADIATION DOSE REDUCTION: This exam was performed according to the departmental dose-optimization program which includes automated exposure control, adjustment of the mA and/or kV according to patient size and/or use of iterative reconstruction technique.  CONTRAST:  11m OMNIPAQUE IOHEXOL 350 MG/ML SOLN COMPARISON:  Previous chest radiograph done earlier today FINDINGS: Cardiovascular: There are no intraluminal filling defects in central pulmonary artery branches. Evaluation of small peripheral subsegmental branches is limited by motion artifacts. Contrast density in thoracic aorta is less than adequate to evaluate the lumen for dissection. Scattered calcifications are seen in thoracic aorta. Fairly extensive coronary artery calcifications are seen. Pacer leads are noted in place. Pacemaker battery is seen in the left infraclavicular region. Mediastinum/Nodes: No significant lymphadenopathy seen. Lungs/Pleura: There is no focal pulmonary consolidation. Small linear densities in the posterior lower lung fields may suggest scarring or subsegmental atelectasis. There is no pleural effusion or pneumothorax. Upper Abdomen: Scattered diverticula are seen in the visualized portions of colon. Small hiatal hernia is seen. Musculoskeletal: Unremarkable. Review of the MIP images confirms the above findings. IMPRESSION: There is no evidence of pulmonary artery embolism. Coronary artery disease. There is no focal pulmonary consolidation. There is no pleural effusion or pneumothorax. Small hiatal hernia.  Diverticulosis of colon. Electronically Signed   By: PElmer PickerM.D.   On: 05/11/2022 20:42   DG Chest Portable 1 View  Result Date: 05/11/2022 CLINICAL DATA:  Shortness of breath. EXAM: PORTABLE CHEST 1 VIEW COMPARISON:  October 12, 2021 FINDINGS: There is a dual lead AICD. The cardiac silhouette is moderately enlarged and unchanged in size. Marked severity calcification of the aortic arch is seen. Mild, stable, chronic appearing increased interstitial lung markings are noted. Very mild atelectatic changes are seen within the bilateral lung bases. There is no evidence of a pleural effusion or pneumothorax. The visualized skeletal structures are unremarkable.  IMPRESSION: Chronic appearing increased interstitial lung markings with very mild bibasilar atelectasis. Electronically Signed   By: TVirgina NorfolkM.D.   On: 05/11/2022 19:34    Microbiology: Results for orders placed or performed during the hospital encounter of 05/11/22  SARS Coronavirus 2 by RT PCR (hospital order, [facility]) *cepheid single result test* Anterior Nasal Swab     Status: None   Collection Time: 05/11/22  7:20 PM   Specimen: Anterior Nasal Swab  Result Value Ref Range Status   SARS Coronavirus 2 by RT PCR NEGATIVE NEGATIVE Final    Comment: (NOTE) SARS-CoV-2 target nucleic acids are NOT DETECTED.  The SARS-CoV-2 RNA is generally detectable in upper and lower respiratory specimens during the acute phase of infection. The lowest concentration of SARS-CoV-2 viral copies this assay can detect is 250 copies / mL. A negative result does not preclude SARS-CoV-2 infection and should not be used as the sole basis for treatment or other patient management decisions.  A negative result may occur with improper specimen collection / handling, submission of specimen other than nasopharyngeal swab, presence of viral mutation(s) within the areas targeted by this assay, and inadequate number of viral copies (<250 copies / mL).  A negative result must be combined with clinical observations, patient history, and epidemiological information.  Fact Sheet for Patients:   https://www.patel.info/  Fact Sheet for Healthcare Providers: https://hall.com/  This test is not yet approved or  cleared by the [place] FDA and has been authorized for detection and/or diagnosis of SARS-CoV-2 by FDA under an Emergency Use Authorization (EUA).  This EUA will remain in effect (meaning this test can be used) for the duration of the COVID-19 declaration under Section 564(b)(1) of the Act, 21 U.S.C. section 360bbb-3(b)(1),  unless the authorization is terminated or revoked sooner.  Performed at [facility], [address] [place], [place] [address]     Labs: CBC: Recent Labs  Lab 05/11/22 1922 05/12/22 0737  WBC 8.3 10.8*  HGB 18.0* 17.0  HCT 54.3* 51.4  MCV 93.5 94.5  PLT 102* 062*   Basic Metabolic Panel: Recent Labs  Lab 05/11/22 1922 05/12/22 0737  NA 138 141  K 4.2 4.6  CL 112* 113*  CO2 22 24  GLUCOSE 108* 143*  BUN 30* 25*  CREATININE 1.37* 1.19  CALCIUM 9.4 9.1   Liver Function Tests: No results for input(s): "AST", "ALT", "ALKPHOS", "BILITOT", "PROT", "ALBUMIN" in the last 168 hours. CBG: Recent Labs  Lab 05/12/22 0809 05/12/22 1128 05/12/22 1608 05/12/22 1946 05/13/22 0849  GLUCAP 152* 157* 158* 114* 125*    Discharge time spent: less than 30 minutes.  Signed: Ezekiel Slocumb, DO [facility] 05/14/2022

## 2022-05-14 ENCOUNTER — Encounter (HOSPITAL_COMMUNITY): Payer: Self-pay

## 2022-05-14 ENCOUNTER — Ambulatory Visit (HOSPITAL_COMMUNITY)
Admission: RE | Admit: 2022-05-14 | Discharge: 2022-05-14 | Disposition: A | Discharge status: Home / Self Care | Payer: Medicare Other | Source: Ambulatory Visit | Attending: Physician Assistant | Admitting: Physician Assistant

## 2022-05-14 VITALS — BP 106/56 | HR 67 | Wt 249.2 lb

## 2022-05-14 DIAGNOSIS — Z8551 Personal history of malignant neoplasm of bladder: Secondary | ICD-10-CM | POA: Insufficient documentation

## 2022-05-14 DIAGNOSIS — I2510 Atherosclerotic heart disease of native coronary artery without angina pectoris: Secondary | ICD-10-CM

## 2022-05-14 DIAGNOSIS — I110 Hypertensive heart disease with heart failure: Secondary | ICD-10-CM | POA: Insufficient documentation

## 2022-05-14 DIAGNOSIS — Z7952 Long term (current) use of systemic steroids: Secondary | ICD-10-CM | POA: Diagnosis not present

## 2022-05-14 DIAGNOSIS — I252 Old myocardial infarction: Secondary | ICD-10-CM | POA: Diagnosis not present

## 2022-05-14 DIAGNOSIS — F17200 Nicotine dependence, unspecified, uncomplicated: Secondary | ICD-10-CM | POA: Insufficient documentation

## 2022-05-14 DIAGNOSIS — Z86711 Personal history of pulmonary embolism: Secondary | ICD-10-CM | POA: Diagnosis not present

## 2022-05-14 DIAGNOSIS — D696 Thrombocytopenia, unspecified: Secondary | ICD-10-CM | POA: Insufficient documentation

## 2022-05-14 DIAGNOSIS — Z86718 Personal history of other venous thrombosis and embolism: Secondary | ICD-10-CM | POA: Insufficient documentation

## 2022-05-14 DIAGNOSIS — Z955 Presence of coronary angioplasty implant and graft: Secondary | ICD-10-CM | POA: Diagnosis not present

## 2022-05-14 DIAGNOSIS — Z95810 Presence of automatic (implantable) cardiac defibrillator: Secondary | ICD-10-CM | POA: Insufficient documentation

## 2022-05-14 DIAGNOSIS — J449 Chronic obstructive pulmonary disease, unspecified: Secondary | ICD-10-CM | POA: Insufficient documentation

## 2022-05-14 DIAGNOSIS — Z79899 Other long term (current) drug therapy: Secondary | ICD-10-CM | POA: Diagnosis not present

## 2022-05-14 DIAGNOSIS — I255 Ischemic cardiomyopathy: Secondary | ICD-10-CM

## 2022-05-14 DIAGNOSIS — Z7901 Long term (current) use of anticoagulants: Secondary | ICD-10-CM | POA: Insufficient documentation

## 2022-05-14 DIAGNOSIS — Z7984 Long term (current) use of oral hypoglycemic drugs: Secondary | ICD-10-CM | POA: Insufficient documentation

## 2022-05-14 DIAGNOSIS — Z7902 Long term (current) use of antithrombotics/antiplatelets: Secondary | ICD-10-CM | POA: Diagnosis not present

## 2022-05-14 DIAGNOSIS — I5022 Chronic systolic (congestive) heart failure: Secondary | ICD-10-CM

## 2022-05-14 DIAGNOSIS — D751 Secondary polycythemia: Secondary | ICD-10-CM | POA: Insufficient documentation

## 2022-05-14 NOTE — Patient Instructions (Addendum)
Thank you for coming in today  You will be scheduled for echocardiogram  Your physician has requested that you have an echocardiogram. Echocardiography is a painless test that uses sound waves to create images of your heart. It provides your doctor with information about the size and shape of your heart and how well your heart's chambers and valves are working. This procedure takes approximately one hour. There are no restrictions for this procedure.     Do the following things EVERYDAY: Weigh yourself in the morning before breakfast. Write it down and keep it in a log. Take your medicines as prescribed Eat low salt foods--Limit salt (sodium) to 2000 mg per day.  Stay as active as you can everyday Limit all fluids for the day to less than 2 liters At the [facility], you and your health needs are our priority. As part of our continuing mission to provide you with exceptional heart care, we have created designated Provider Care Teams. These Care Teams include your primary Cardiologist (physician) and Advanced Practice Providers (APPs- Physician Assistants and Nurse Practitioners) who all work together to provide you with the care you need, when you need it.   You may see any of the following providers on your designated Care Team at your next follow up: Dr Glori Bickers Dr Loralie Champagne Dr. Roxana Hires, NP Lyda Jester, [place] [facility] [place], [place] Forestine Na, NP Audry Riles, PharmD   Please be sure to bring in all your medications bottles to every appointment.   If you have any questions or concerns before your next appointment please send [place] a message through [place] or call our office at [phone].    TO LEAVE A MESSAGE FOR THE NURSE SELECT OPTION 2, PLEASE LEAVE A MESSAGE INCLUDING: YOUR NAME DATE OF BIRTH CALL BACK NUMBER REASON FOR CALL**this is important as we prioritize the call backs  YOU WILL RECEIVE A CALL BACK THE  SAME DAY AS LONG AS YOU CALL BEFORE 4:00 PM

## 2022-05-17 ENCOUNTER — Telehealth (HOSPITAL_COMMUNITY): Payer: Self-pay | Admitting: Vascular Surgery

## 2022-05-17 NOTE — Telephone Encounter (Signed)
Lvm giving echo appt @ [facility] 10/13 @ 2 asked pt to call back to confirm appt

## 2022-05-23 ENCOUNTER — Ambulatory Visit (INDEPENDENT_AMBULATORY_CARE_PROVIDER_SITE_OTHER): Payer: Medicare Other

## 2022-05-23 DIAGNOSIS — I255 Ischemic cardiomyopathy: Secondary | ICD-10-CM

## 2022-05-23 LAB — CUP PACEART REMOTE DEVICE CHECK
Battery Remaining Longevity: 30 mo
Battery Voltage: 2.94 V
Brady Statistic AP VP Percent: 0.02 %
Brady Statistic AP VS Percent: 13.43 %
Brady Statistic AS VP Percent: 0.04 %
Brady Statistic AS VS Percent: 86.5 %
Brady Statistic RA Percent Paced: 13.26 %
Brady Statistic RV Percent Paced: 0.06 %
Date Time Interrogation Session: 20231018044223
HighPow Impedance: 80 Ohm
Implantable Lead Implant Date: 20160412
Implantable Lead Implant Date: 20160412
Implantable Lead Location: 753859
Implantable Lead Location: 753860
Implantable Lead Model: 5076
Implantable Lead Model: 6935
Implantable Pulse Generator Implant Date: 20160412
Lead Channel Impedance Value: 323 Ohm
Lead Channel Impedance Value: 361 Ohm
Lead Channel Impedance Value: 456 Ohm
Lead Channel Pacing Threshold Amplitude: 0.625 V
Lead Channel Pacing Threshold Amplitude: 2.125 V
Lead Channel Pacing Threshold Pulse Width: 0.4 ms
Lead Channel Pacing Threshold Pulse Width: 0.4 ms
Lead Channel Sensing Intrinsic Amplitude: 4.625 mV
Lead Channel Sensing Intrinsic Amplitude: 4.625 mV
Lead Channel Sensing Intrinsic Amplitude: 6.125 mV
Lead Channel Sensing Intrinsic Amplitude: 6.125 mV
Lead Channel Setting Pacing Amplitude: 1.5 V
Lead Channel Setting Pacing Amplitude: 2.5 V
Lead Channel Setting Pacing Pulse Width: 1 ms
Lead Channel Setting Sensing Sensitivity: 0.3 mV

## 2022-05-28 ENCOUNTER — Ambulatory Visit: Payer: Medicare Other | Attending: Physician Assistant

## 2022-05-28 DIAGNOSIS — I5022 Chronic systolic (congestive) heart failure: Secondary | ICD-10-CM | POA: Insufficient documentation

## 2022-05-28 LAB — ECHOCARDIOGRAM COMPLETE
AR max vel: 2.39 cm2
AV Peak grad: 3.7 mmHg
Ao pk vel: 0.96 m/s
Area-P 1/2: 4.96 cm2
Calc EF: 43.5 %
P 1/2 time: 688 msec
S' Lateral: 4.9 cm
Single Plane A2C EF: 48.2 %
Single Plane A4C EF: 38.9 %

## 2022-05-28 MED ORDER — PERFLUTREN LIPID MICROSPHERE
1.0000 mL | INTRAVENOUS | Status: AC | PRN
  Administered 2022-05-28: 2 mL via INTRAVENOUS

## 2022-05-29 ENCOUNTER — Telehealth (HOSPITAL_COMMUNITY): Payer: Self-pay | Admitting: Surgery

## 2022-05-29 NOTE — Telephone Encounter (Signed)
I called patient to review results.  He is aware and was grateful for the call.

## 2022-05-29 NOTE — Telephone Encounter (Signed)
-----   Message from Joette Catching, [place] sent at 05/29/2022 11:57 AM EDT ----- EF slightly improved from 20-25% to 30-35%. Continue current meds, no change.

## 2022-06-05 NOTE — Progress Notes (Signed)
Remote ICD transmission.   

## 2022-08-22 ENCOUNTER — Ambulatory Visit: Payer: Medicare Other | Attending: Internal Medicine

## 2022-08-22 DIAGNOSIS — I255 Ischemic cardiomyopathy: Secondary | ICD-10-CM

## 2022-08-23 LAB — CUP PACEART REMOTE DEVICE CHECK
Battery Remaining Longevity: 29 mo
Battery Voltage: 2.94 V
Brady Statistic AP VP Percent: 0.01 %
Brady Statistic AP VS Percent: 14.47 %
Brady Statistic AS VP Percent: 0.05 %
Brady Statistic AS VS Percent: 85.47 %
Brady Statistic RA Percent Paced: 14.26 %
Brady Statistic RV Percent Paced: 0.06 %
Date Time Interrogation Session: 20240117052824
HighPow Impedance: 86 Ohm
Implantable Lead Connection Status: 753985
Implantable Lead Connection Status: 753985
Implantable Lead Implant Date: 20160412
Implantable Lead Implant Date: 20160412
Implantable Lead Location: 753859
Implantable Lead Location: 753860
Implantable Lead Model: 5076
Implantable Lead Model: 6935
Implantable Pulse Generator Implant Date: 20160412
Lead Channel Impedance Value: 304 Ohm
Lead Channel Impedance Value: 399 Ohm
Lead Channel Impedance Value: 456 Ohm
Lead Channel Pacing Threshold Amplitude: 0.625 V
Lead Channel Pacing Threshold Amplitude: 2 V
Lead Channel Pacing Threshold Pulse Width: 0.4 ms
Lead Channel Pacing Threshold Pulse Width: 0.4 ms
Lead Channel Sensing Intrinsic Amplitude: 6 mV
Lead Channel Sensing Intrinsic Amplitude: 6 mV
Lead Channel Sensing Intrinsic Amplitude: 6.875 mV
Lead Channel Sensing Intrinsic Amplitude: 6.875 mV
Lead Channel Setting Pacing Amplitude: 1.5 V
Lead Channel Setting Pacing Amplitude: 2.5 V
Lead Channel Setting Pacing Pulse Width: 1 ms
Lead Channel Setting Sensing Sensitivity: 0.3 mV
Zone Setting Status: 755011

## 2022-08-26 ENCOUNTER — Other Ambulatory Visit (HOSPITAL_COMMUNITY): Payer: Self-pay | Admitting: Cardiology

## 2022-08-29 ENCOUNTER — Other Ambulatory Visit (HOSPITAL_COMMUNITY): Payer: Self-pay

## 2022-08-29 MED ORDER — APIXABAN 2.5 MG PO TABS: 2.5000 mg | ORAL_TABLET | Freq: Two times a day (BID) | ORAL | 3 refills | Status: DC

## 2022-09-05 ENCOUNTER — Encounter: Payer: Self-pay | Admitting: Internal Medicine

## 2022-09-05 ENCOUNTER — Ambulatory Visit (INDEPENDENT_AMBULATORY_CARE_PROVIDER_SITE_OTHER): Payer: Medicare Other | Admitting: Internal Medicine

## 2022-09-05 VITALS — BP 118/61 | HR 76 | Temp 97.6°F | Ht 70.0 in | Wt 261.2 lb

## 2022-09-05 DIAGNOSIS — E119 Type 2 diabetes mellitus without complications: Secondary | ICD-10-CM

## 2022-09-05 DIAGNOSIS — E538 Deficiency of other specified B group vitamins: Secondary | ICD-10-CM

## 2022-09-05 DIAGNOSIS — G252 Other specified forms of tremor: Secondary | ICD-10-CM

## 2022-09-05 DIAGNOSIS — Z1211 Encounter for screening for malignant neoplasm of colon: Secondary | ICD-10-CM

## 2022-09-05 DIAGNOSIS — Z23 Encounter for immunization: Secondary | ICD-10-CM | POA: Diagnosis not present

## 2022-09-05 DIAGNOSIS — N521 Erectile dysfunction due to diseases classified elsewhere: Secondary | ICD-10-CM

## 2022-09-05 DIAGNOSIS — N529 Male erectile dysfunction, unspecified: Secondary | ICD-10-CM | POA: Insufficient documentation

## 2022-09-05 DIAGNOSIS — Z7984 Long term (current) use of oral hypoglycemic drugs: Secondary | ICD-10-CM

## 2022-09-05 DIAGNOSIS — E559 Vitamin D deficiency, unspecified: Secondary | ICD-10-CM | POA: Diagnosis not present

## 2022-09-05 DIAGNOSIS — J449 Chronic obstructive pulmonary disease, unspecified: Secondary | ICD-10-CM | POA: Diagnosis not present

## 2022-09-05 DIAGNOSIS — F17210 Nicotine dependence, cigarettes, uncomplicated: Secondary | ICD-10-CM

## 2022-09-05 DIAGNOSIS — I10 Essential (primary) hypertension: Secondary | ICD-10-CM | POA: Diagnosis not present

## 2022-09-05 DIAGNOSIS — M545 Low back pain, unspecified: Secondary | ICD-10-CM | POA: Insufficient documentation

## 2022-09-05 DIAGNOSIS — Z0000 Encounter for general adult medical examination without abnormal findings: Secondary | ICD-10-CM

## 2022-09-05 LAB — GLUCOSE, CAPILLARY: Glucose-Capillary: 156 mg/dL — ABNORMAL HIGH (ref 70–99)

## 2022-09-05 LAB — POCT GLYCOSYLATED HEMOGLOBIN (HGB A1C): Hemoglobin A1C: 6 % — AB (ref 4.0–5.6)

## 2022-09-05 MED ORDER — CAPSAICIN 0.035 % EX LOTN: 1.0000 | TOPICAL_LOTION | Freq: Three times a day (TID) | CUTANEOUS | 3 refills | Status: DC | PRN

## 2022-09-05 MED ORDER — SILDENAFIL CITRATE 50 MG PO TABS: 50.0000 mg | ORAL_TABLET | Freq: Every day | ORAL | 0 refills | Status: DC | PRN

## 2022-09-05 MED ORDER — ALBUTEROL SULFATE (2.5 MG/3ML) 0.083% IN NEBU: 2.5000 mg | INHALATION_SOLUTION | Freq: Four times a day (QID) | RESPIRATORY_TRACT | 3 refills | Status: DC | PRN

## 2022-09-05 NOTE — Assessment & Plan Note (Signed)
-  flu shot today - He is interested in covid vaccine, and we encouraged him to get this at the pharmacy - Referral for colonoscopy today

## 2022-09-05 NOTE — Assessment & Plan Note (Signed)
-  Blood pressure is at goal today - Continue entresto, bisoprolol, spironolactone - no changes made - BMP

## 2022-09-05 NOTE — Patient Instructions (Addendum)
It was a pleasure seeing you in clinic today.  For your low back pain: - I have sent capsaicin cream to your mail order pharmacy - Increase your physical activity. If you would like a referral to Physical Therapy, please let me know  For your ED: - I have sent in a prescription for Viagra. Please do NOT take this together with the nitroglycerin, as this would be very dangerous - I think increasing your physical activity & weight loss will also help with ED  For your hand tremors: - Your exam does not show any evidence of Parkinson's Disease - I am checking some labs today, and will let you know if they are abnormal  I have also sent your albuterol refills to your mail order pharmacy  I have also placed a referral to GI for a colonoscopy to screen for colon cancer.  I'll see you back in 2 months.  Dr. Lottie Mussel

## 2022-09-05 NOTE — Assessment & Plan Note (Signed)
-  check urine microalbumin today - A1C at goal of <7. It is 6.0 today - continue Jardiance '10mg'$  daily

## 2022-09-05 NOTE — Progress Notes (Signed)
[facility]: Clinic Note  Subjective:  History of Present Illness: Paul Bullock is a 77 y.o. year old male who presents for routine follow up. I saw him on 03/28/22.  Since last visit, he was hospitalized 05/11/22 for COPD exacerbation. Breathing is doing well today.  4 main concerns today:  Low back pain: for the past 2 months, he has had intermittent low back pain. It is worse when he is standing still for extended periods of time. It does not radiate down his buttocks or legs. No associated numbness, tingling, or weakness of lower extremities. No falls. Heat and capsaicin cream have been very helpful.   Weight gain: gained weight over the holidays, and is interested in losing this. Planning to join a gym and has been eating healthy foods, lots of salads and lean proteins.   ED: For the past several months, he has been unable to maintain an erection. This has not happened before. He is able to achieve an erection, but not maintain it. No numbness or tingling in extremities. He does not use the nitroglycerin he has a script for. Has not taken anything for ED.  Tremor: He has noticed a tremor in his hands with activity, such as eating with a fork. Bilateral. No tremor at rest. He knew someone with Parkinson's disease, and is concerned this could be related.    Please refer to Assessment and Plan below for full details in Problem-Based Charting.   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   Dyslipidemia 98/33/8250   Chronic systolic CHF (congestive heart failure) ([place]) 05/12/2022   Acute respiratory failure with hypoxia ([place]) 05/12/2022   Chronic kidney disease, stage 3a ([place]) 05/12/2022   Obesity (BMI 30-39.9) 05/12/2022   COPD exacerbation ([place]) 05/11/2022   COPD (chronic obstructive pulmonary disease) ([place]) 03/28/2022   Healthcare  maintenance 03/28/2022   Drug-induced gynecomastia 03/28/2022   Congestive heart failure ([place]) 03/26/2022   Presence of automatic (implantable) cardiac defibrillator 03/26/2022   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[place]) 03/26/2022   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   Colon polyps 12/05/2021   Acrochordon 12/05/2021   Thrombocytopenia ([place]) 10/13/2021   Polycythemia 10/13/2021   History of pulmonary embolus (PE) 10/12/2021   Rectal bleeding 10/12/2021   Atherosclerotic heart disease of native coronary artery without angina pectoris 01/06/2021   Essential (primary) hypertension 01/06/2021   Hyperlipidemia 01/06/2021   Type 2 diabetes mellitus without complication ([place]) 53/97/6734   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   Ischemic cardiomyopathy 01/06/2021   Tobacco dependence 04/11/2018   History of bladder cancer 03/11/2018      Medications:  Current Outpatient Medications:    albuterol (PROVENTIL) (2.5 MG/3ML) 0.083% nebulizer solution, Take 3 mLs (2.5 mg total) by nebulization every 6 (six) hours as needed for wheezing or shortness of breath., Disp: 540 mL, Rfl: 3   apixaban (ELIQUIS) 2.5 MG TABS tablet, Take 1 tablet (2.5 mg total) by mouth 2 (two) times daily., Disp: 180 tablet, Rfl: 3   bisoprolol (ZEBETA) 5 MG tablet, Take 1.5 tablets (7.5 mg total) by mouth daily., Disp: 135 tablet, Rfl: 3   Capsaicin 0.035 % LOTN, Apply 1 Film topically 3 (three) times daily as needed (back pain). Apply thin film to lower back three times daily as needed for pain, Disp: 113 g, Rfl: 3   Cholecalciferol (VITAMIN D-3) 125 MCG (5000 UT) TABS, Take  5,000 Units by mouth daily., Disp: , Rfl:    clopidogrel (PLAVIX) 75 MG tablet, TAKE 1 TABLET DAILY, Disp: 90 tablet, Rfl: 3   Cyanocobalamin 2500 MCG TABS, Take 2,500 mcg by mouth daily., Disp: , Rfl:    fluticasone (FLONASE) 50 MCG/ACT nasal spray, Place 2 sprays into both nostrils daily. As  needed, Disp: , Rfl:    fluticasone furoate-vilanterol (BREO  ELLIPTA) 100-25 MCG/ACT AEPB, Inhale into the lungs., Disp: , Rfl:    JARDIANCE 10 MG TABS tablet, TAKE 1 TABLET DAILY, Disp: 90 tablet, Rfl: 3   Multiple Vitamins-Minerals (MULTIVITAMIN WITH MINERALS) tablet, Take 1 tablet by mouth daily., Disp: , Rfl:    nitroGLYCERIN (NITROSTAT) 0.4 MG SL tablet, Place 1 tablet (0.4 mg total) under the tongue every 5 (five) minutes as needed for chest pain., Disp: 25 tablet, Rfl: 3   nystatin (MYCOSTATIN) 100000 UNIT/ML suspension, Take 2 mLs (200,000 Units total) by mouth 4 (four) times daily., Disp: 473 mL, Rfl: 0   Pitavastatin Calcium (LIVALO) 4 MG TABS, Take 1 tablet (4 mg total) by mouth daily., Disp: 90 tablet, Rfl: 3   sacubitril-valsartan (ENTRESTO) 49-51 MG, Take 1 tablet by mouth 2 (two) times daily., Disp: 180 tablet, Rfl: 3   sildenafil (VIAGRA) 50 MG tablet, Take 1 tablet (50 mg total) by mouth daily as needed for erectile dysfunction., Disp: 30 tablet, Rfl: 0   spironolactone (ALDACTONE) 25 MG tablet, TAKE 1 TABLET DAILY, Disp: 90 tablet, Rfl: 3   torsemide (DEMADEX) 20 MG tablet, Take 0.5 tablets (10 mg total) by mouth every other day. Take an extra half tablet PRN for weight gain/edema. (3lbs in 1 day or 5 lbs in 1 week), Disp: 15 tablet, Rfl: 3   Allergies: Allergies  Allergen Reactions   Atorvastatin Other (See Comments)    Myalgias   Rosuvastatin Other (See Comments)    myalgias     Objective:   Vitals: Vitals:   09/05/22 1038  BP: 118/61  Pulse: 76  Temp: 97.6 F (36.4 C)  SpO2: 99%      Physical Exam: Physical Exam Constitutional:      Appearance: Normal appearance. He is obese.  Cardiovascular:     Rate and Rhythm: Normal rate and regular rhythm.     Heart sounds: No murmur heard.    No friction rub.  Pulmonary:     Effort: Pulmonary effort is normal. No respiratory distress.     Breath sounds: Normal breath sounds. No wheezing.  Musculoskeletal:     Comments: No tenderness to palpation down entire spine. Mild  tenderness to bilateral paraspinal muscles along lumbar spine.   Neurological:     Mental Status: He is alert.  Psychiatric:        Mood and Affect: Mood normal.        Behavior: Behavior normal.      Data: Labs, imaging, and micro were reviewed in Epic. Refer to Assessment and Plan below for full details in Problem-Based Charting.  Assessment & Plan:  Healthcare maintenance - flu shot today - He is interested in covid vaccine, and we encouraged him to get this at the pharmacy - Referral for colonoscopy today  Essential (primary) hypertension - Blood pressure is at goal today - Continue entresto, bisoprolol, spironolactone - no changes made - BMP  Low back pain - I think this is musculoskeletal pain, and there are no red flags on my history or exam today - I offered PT referral. Patient would like to try  the gym first, and will call if he wants to add PT - Capsaicin cream - Continue heat  Type 2 diabetes mellitus without complication (HCC) - check urine microalbumin today - A1C at goal of <7. It is 6.0 today - continue Jardiance '10mg'$  daily   Erectile dysfunction - He has multiple risk factors for ED, including HTN, DM, HLD, and smoking. Minimal alcohol use. - Will start with Sildenafil '50mg'$  daily. 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 No family history of tremor. He drinks alcohol rarely, so hasn't noticed any improvement or worsening with alcohol. Checking BMP, TSH, vitamin B12 levels today. If all is normal and tremor persists, can consider Neurology referral at next appointment.        Patient will follow up in 2 months.   Lottie Mussel, MD

## 2022-09-05 NOTE — Assessment & Plan Note (Signed)
-  He has multiple risk factors for ED, including HTN, DM, HLD, and smoking. Minimal alcohol use. - Will start with Sildenafil '50mg'$  daily. I counseled him never to use this with sl nitroglycerin, which he has never used

## 2022-09-05 NOTE — Assessment & Plan Note (Signed)
-  I think this is musculoskeletal pain, and there are no red flags on my history or exam today - I offered PT referral. Patient would like to try the gym first, and will call if he wants to add PT - Capsaicin cream - Continue heat

## 2022-09-05 NOTE — Assessment & Plan Note (Signed)
-  Patient has an action tremor in both hands. No family history of tremor. He drinks alcohol rarely, so hasn't noticed any improvement or worsening with alcohol. Checking BMP, TSH, vitamin B12 levels today. If all is normal and tremor persists, can consider Neurology referral at next appointment.

## 2022-09-06 ENCOUNTER — Telehealth: Payer: Self-pay

## 2022-09-06 NOTE — Telephone Encounter (Signed)
Incoming fax from pharmacy regarding

## 2022-09-07 ENCOUNTER — Telehealth: Payer: Self-pay

## 2022-09-07 LAB — MICROALBUMIN / CREATININE URINE RATIO
Creatinine, Urine: 104.4 mg/dL
Microalb/Creat Ratio: 12 mg/g{creat} (ref 0–29)
Microalbumin, Urine: 12.5 ug/mL

## 2022-09-07 LAB — BMP8+ANION GAP
Anion Gap: 18 mmol/L (ref 10.0–18.0)
BUN/Creatinine Ratio: 21 (ref 10–24)
BUN: 27 mg/dL (ref 8–27)
CO2: 21 mmol/L (ref 20–29)
Calcium: 10.1 mg/dL (ref 8.6–10.2)
Chloride: 104 mmol/L (ref 96–106)
Creatinine, Ser: 1.29 mg/dL — ABNORMAL HIGH (ref 0.76–1.27)
Glucose: 105 mg/dL — ABNORMAL HIGH (ref 70–99)
Potassium: 4.5 mmol/L (ref 3.5–5.2)
Sodium: 143 mmol/L (ref 134–144)
eGFR: 58 mL/min/{1.73_m2} — ABNORMAL LOW (ref >59)

## 2022-09-07 LAB — TSH: TSH: 1.76 u[IU]/mL (ref 0.450–4.500)

## 2022-09-07 LAB — VITAMIN B12: Vitamin B-12: 1412 pg/mL — ABNORMAL HIGH (ref 232–1245)

## 2022-09-07 LAB — VITAMIN D 25 HYDROXY (VIT D DEFICIENCY, FRACTURES): Vit D, 25-Hydroxy: 64.6 ng/mL (ref 30.0–100.0)

## 2022-09-07 NOTE — Telephone Encounter (Signed)
Pt is requesting a call back .Marland Kitchen He stated that his PCP left a vmail for him to give her a call back

## 2022-09-10 ENCOUNTER — Telehealth: Payer: Self-pay | Admitting: *Deleted

## 2022-09-10 NOTE — Telephone Encounter (Signed)
Pt had called stating [facility] would not fill his Sildenafil rx d/t an interaction. He stated Dr Cain Sieve had discussed with him. Also stated  in her office note on 09/05/22- "Will start with Sildenafil '50mg'$  daily. I counseled him never to use this with sl nitroglycerin, which he has never used ". And pt stated the pharmacy needs a call to verify this. After several telephone calls, I finally talked to Rhoderick Moody, pharmacist, to let him know pt was counseled by Dr Cain Sieve about using NTG. Stated they need documentstion. Stated he will fill rx and pt should received the med by Thurs or Fri.

## 2022-09-11 ENCOUNTER — Other Ambulatory Visit (HOSPITAL_COMMUNITY): Payer: Self-pay | Admitting: Cardiology

## 2022-09-11 NOTE — Progress Notes (Signed)
PCP: Lottie Mussel, MD Cardiology: Dr. Aundra Dubin  77 y.o. retired [employer] with history of COPD, DVT/PE, ischemic cardiomyopathy, and CAD.  Patient suffered a myocardial infarction complicated by cardiac arrest in 2002 and required stenting of the obtuse marginal.  He notes that he subsequently had LV dysfunction.  In 2013, he required repeat diagnostic catheterization which revealed moderate disease distal to previously placed obtuse marginal stent with a 75% stenosis in the mid LAD with aneurysmal post stenotic dilation.  Per notes, he was medically managed.  In the setting of LV dysfunction with an EF as low as 20%, he underwent Medtronic AICD placement in April 2016, and up until 2022, he was followed closely by cardiology in [place], [place].    He did require admission to a hospital in [facility] in June 2002, in the setting of COPD exacerbation while visiting his son.  Per discharge summary, he was noted to have mild troponin elevation which was felt to be secondary to demand ischemia.   Mr. Traver moved to [place] in December 2022 and has been living locally.  His ex-wife lives nearby and they are close friends.  Admitted to [facility] with NSTEMI 03/23.  LHC at [facility] revealed severe proximal to mid LAD disease with otherwise nonobstructive disease and patent obtuse marginal stent.  His LVEDP was approximately 40 mmHg and ventriculogram showed an EF of less than 25%.  He was given Lasix in the lab and after discussion with advanced heart failure team at [facility], he was transferred for further evaluation and potential inotropic support.  At [facility], he was diuresed and started on GDMT.  He had DES x 2 to proximal and mid LAD and was discharged home.   Admitted 10/23 with acute respiratory failure with hypoxia 2/2 COPD exacerbation. No evidence of PE on CTA chest. Treated with IV steroids, DuoNebs and antibiotics. Weaned off O2 prior to discharge.  Post hospital follow up  10/23, NYHA II and volume stable. Echo showed EF 30-35%, LV moderately reduced with mild LVH and grade II DD, RV low/normal function.  Today he returns for HF follow up. Overall feeling fine. He is not SOB with walking up steps or with lifting. Denies palpitations, CP, dizziness, edema, or PND/Orthopnea. Appetite ok. No fever or chills. Weight at home 249 pounds. Taking all medications. Continues to smoke 1/2 ppd, rare ETOH. Son is getting married in a few months in [place].   Medtronic ICD: OptiVol fluid index below threshold, thoracic impedance stable, no AT/AF or VT, 1 hr day/activity  Labs (1/24): K 4.5, creatinine 1.29  PMH: 1. COPD: Quit smoking in 3/23.  2. DVT/PE 3. Chronic systolic CHF: Ischemic cardiomyopathy.  Medtronic ICD from 4/16.  - Echo (3/23): EF 20-25% - RHC (3/23): mean RA 25, PA 62/47, mean PCWP 39, CI 2.03.  - Echo (10/23): EF 30-35% 4. CAD: MI with OM2 stent in 2002.  - NSTEMI 3/23 with DES x 2 to proximal and mid LAD.  5. 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  6. Bladder cancer 2012: Chemoradiation.  7. Type 2 DM 8. Polycythemia: Likely due to chronic smoking, JAK2 mutation negative.   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 No ETOH.   Family History  Problem Relation Age of Onset   Stroke Mother    Bladder Cancer Father    Heart attack Brother    ROS: All systems reviewed and negative except as per HPI.   Current Outpatient Medications  Medication  Sig Dispense Refill   albuterol (PROVENTIL) (2.5 MG/3ML) 0.083% nebulizer solution Take 3 mLs (2.5 mg total) by nebulization every 6 (six) hours as needed for wheezing or shortness of breath. 540 mL 3   apixaban (ELIQUIS) 2.5 MG TABS tablet Take 1 tablet (2.5 mg total) by mouth 2 (two) times daily. 180 tablet 3   bisoprolol (ZEBETA) 5 MG tablet Take 1.5 tablets (7.5 mg total) by mouth daily. 135 tablet 3   Capsaicin 0.035 % LOTN Apply 1 Film topically 3 (three) times daily as  needed (back pain). Apply thin film to lower back three times daily as needed for pain 113 g 3   Cholecalciferol (VITAMIN D-3) 125 MCG (5000 UT) TABS Take 5,000 Units by mouth daily.     clopidogrel (PLAVIX) 75 MG tablet TAKE 1 TABLET DAILY 90 tablet 3   fluticasone (FLONASE) 50 MCG/ACT nasal spray Place 2 sprays into both nostrils daily. As  needed     fluticasone furoate-vilanterol (BREO ELLIPTA) 100-25 MCG/ACT AEPB Inhale into the lungs.     JARDIANCE 10 MG TABS tablet TAKE 1 TABLET DAILY 90 tablet 3   Multiple Vitamins-Minerals (MULTIVITAMIN WITH MINERALS) tablet Take 1 tablet by mouth daily.     nitroGLYCERIN (NITROSTAT) 0.4 MG SL tablet Place 1 tablet (0.4 mg total) under the tongue every 5 (five) minutes as needed for chest pain. 25 tablet 3   nystatin (MYCOSTATIN) 100000 UNIT/ML suspension Take 2 mLs (200,000 Units total) by mouth 4 (four) times daily. 473 mL 0   Pitavastatin Calcium (LIVALO) 4 MG TABS Take 1 tablet (4 mg total) by mouth daily. 90 tablet 3   sacubitril-valsartan (ENTRESTO) 49-51 MG Take 1 tablet by mouth 2 (two) times daily. 180 tablet 3   sildenafil (VIAGRA) 50 MG tablet Take 1 tablet (50 mg total) by mouth daily as needed for erectile dysfunction. 30 tablet 0   spironolactone (ALDACTONE) 25 MG tablet TAKE 1 TABLET DAILY 90 tablet 3   torsemide (DEMADEX) 20 MG tablet Take 0.5 tablets (10 mg total) by mouth every other day. Take an extra half tablet PRN for weight gain/edema. (3lbs in 1 day or 5 lbs in 1 week) 15 tablet 3   No current facility-administered medications for this encounter.   Wt Readings from Last 3 Encounters:  09/14/22 117 kg (258 lb)  09/05/22 118.5 kg (261 lb 3.2 oz)  05/14/22 113 kg (249 lb 3.2 oz)   BP (!) 106/58   Pulse 65   Wt 117 kg (258 lb)   SpO2 95%   BMI 37.02 kg/m  Physical Exam General:  NAD. No resp difficulty, walked into clinic. HEENT: Normal Neck: Supple. No JVD, thick neck Carotids 2+ bilat; no bruits. No lymphadenopathy or  thryomegaly appreciated. Cor: PMI nondisplaced. Regular rate & rhythm. No rubs, gallops or murmurs. Lungs: Clear Abdomen: Soft, nontender, nondistended. No hepatosplenomegaly. No bruits or masses. Good bowel sounds. Extremities: No cyanosis, clubbing, rash, trace pedal edema Neuro: Alert & oriented x 3, cranial nerves grossly intact. Moves all 4 extremities w/o difficulty. Affect pleasant.  Assessment/Plan: 1.  CAD: Patient has history of CAD with prior PCI to OM in 2002.  NSTEMI in 3/23 with cath showing complex proximal LAD disease with serial 90% stenoses separated by small aneurysmal areas.  He had a 60% mid LCx stenosis.  RCA with mild disease.  He had DES x 2 to proximal and mid LAD. No chest pain.  - He will need to be on anticoagulation due to previous  DVT/PE (apixaban), on plavix for at least 6 months, ideally 1 year. Not on ASA.  - Good lipids in 5/23 on Livalo.   2. Chronic systolic CHF: Medtronic ICD.  Ischemic cardiomyopathy.  Echo in 3/23 with EF 20-25%, severe LV dilation, RV mildly dysfunctional.  RHC in 3/23 with markedly elevated filling pressures, CI 2.03, and PAPI < 1. Echo 10/23 showed EF improved to 30-35%. NYHA II. He is not volume overloaded on exam or by Optivol.  - Continue torsemide 10 mg qod. Can take extra torsemide as needed  - Continue bisoprolol 7.5 mg daily (beta-1 selective with COPD).  - Continue Entresto 49/51 mg bid. - Continue empagliflozin 10 mg daily.  No GU symptoms. - Continue spironolactone 25 mg daily.  - QRS does not appear wide enough to benefit from CRT upgrade.   3. 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   - Continue apixaban, dose lowered to 2.5 mg bid while on Plavix. CBC today. 4. COPD/smoking: Has started smoking again. Discussed cessation.  5. Thrombocytopenia: Mild, chronic.  6. Bladder cancer: Has been followed by cystoscopies   7. Polycythemia: JAK2 mutation negative, likely due to chronic smoking.  8. Thrush: has had this in the past  but no symptoms currently. He is asking for refill of nystatin suspension. Will give 1 courtesy refill today, but further refills will need to come from PCP.  Follow up 4 months with Dr. Wynema Birch [facility] FNP-BC 09/14/2022

## 2022-09-13 NOTE — Progress Notes (Signed)
Remote ICD transmission.   

## 2022-09-14 ENCOUNTER — Ambulatory Visit (HOSPITAL_COMMUNITY)
Admission: RE | Admit: 2022-09-14 | Discharge: 2022-09-14 | Disposition: A | Discharge status: Home / Self Care | Payer: Medicare Other | Source: Ambulatory Visit | Attending: Family Medicine | Admitting: Family Medicine

## 2022-09-14 ENCOUNTER — Encounter (HOSPITAL_COMMUNITY): Payer: Self-pay

## 2022-09-14 VITALS — BP 106/58 | HR 65 | Wt 258.0 lb

## 2022-09-14 DIAGNOSIS — Z8674 Personal history of sudden cardiac arrest: Secondary | ICD-10-CM | POA: Insufficient documentation

## 2022-09-14 DIAGNOSIS — J449 Chronic obstructive pulmonary disease, unspecified: Secondary | ICD-10-CM | POA: Diagnosis not present

## 2022-09-14 DIAGNOSIS — B370 Candidal stomatitis: Secondary | ICD-10-CM

## 2022-09-14 DIAGNOSIS — Z955 Presence of coronary angioplasty implant and graft: Secondary | ICD-10-CM | POA: Insufficient documentation

## 2022-09-14 DIAGNOSIS — Z8249 Family history of ischemic heart disease and other diseases of the circulatory system: Secondary | ICD-10-CM | POA: Insufficient documentation

## 2022-09-14 DIAGNOSIS — Z7901 Long term (current) use of anticoagulants: Secondary | ICD-10-CM | POA: Diagnosis not present

## 2022-09-14 DIAGNOSIS — Z760 Encounter for issue of repeat prescription: Secondary | ICD-10-CM | POA: Diagnosis not present

## 2022-09-14 DIAGNOSIS — E119 Type 2 diabetes mellitus without complications: Secondary | ICD-10-CM | POA: Diagnosis not present

## 2022-09-14 DIAGNOSIS — I255 Ischemic cardiomyopathy: Secondary | ICD-10-CM | POA: Diagnosis not present

## 2022-09-14 DIAGNOSIS — Z8551 Personal history of malignant neoplasm of bladder: Secondary | ICD-10-CM

## 2022-09-14 DIAGNOSIS — I252 Old myocardial infarction: Secondary | ICD-10-CM | POA: Diagnosis not present

## 2022-09-14 DIAGNOSIS — D751 Secondary polycythemia: Secondary | ICD-10-CM

## 2022-09-14 DIAGNOSIS — I2510 Atherosclerotic heart disease of native coronary artery without angina pectoris: Secondary | ICD-10-CM | POA: Insufficient documentation

## 2022-09-14 DIAGNOSIS — Z7984 Long term (current) use of oral hypoglycemic drugs: Secondary | ICD-10-CM | POA: Insufficient documentation

## 2022-09-14 DIAGNOSIS — D696 Thrombocytopenia, unspecified: Secondary | ICD-10-CM | POA: Diagnosis not present

## 2022-09-14 DIAGNOSIS — C679 Malignant neoplasm of bladder, unspecified: Secondary | ICD-10-CM | POA: Insufficient documentation

## 2022-09-14 DIAGNOSIS — I5022 Chronic systolic (congestive) heart failure: Secondary | ICD-10-CM

## 2022-09-14 DIAGNOSIS — Z86711 Personal history of pulmonary embolism: Secondary | ICD-10-CM | POA: Insufficient documentation

## 2022-09-14 DIAGNOSIS — Z86718 Personal history of other venous thrombosis and embolism: Secondary | ICD-10-CM | POA: Insufficient documentation

## 2022-09-14 DIAGNOSIS — J441 Chronic obstructive pulmonary disease with (acute) exacerbation: Secondary | ICD-10-CM | POA: Diagnosis not present

## 2022-09-14 LAB — BASIC METABOLIC PANEL
Anion gap: 9 (ref 5–15)
BUN: 15 mg/dL (ref 8–23)
CO2: 24 mmol/L (ref 22–32)
Calcium: 9.3 mg/dL (ref 8.9–10.3)
Chloride: 105 mmol/L (ref 98–111)
Creatinine, Ser: 1.27 mg/dL — ABNORMAL HIGH (ref 0.61–1.24)
GFR, Estimated: 59 mL/min — ABNORMAL LOW (ref >60)
Glucose, Bld: 112 mg/dL — ABNORMAL HIGH (ref 70–99)
Potassium: 4.6 mmol/L (ref 3.5–5.1)
Sodium: 138 mmol/L (ref 135–145)

## 2022-09-14 LAB — CBC
HCT: 53.7 % — ABNORMAL HIGH (ref 39.0–52.0)
Hemoglobin: 18.1 g/dL — ABNORMAL HIGH (ref 13.0–17.0)
MCH: 32.1 pg (ref 26.0–34.0)
MCHC: 33.7 g/dL (ref 30.0–36.0)
MCV: 95.4 fL (ref 80.0–100.0)
Platelets: 111 K/uL — ABNORMAL LOW (ref 150–400)
RBC: 5.63 MIL/uL (ref 4.22–5.81)
RDW: 13 % (ref 11.5–15.5)
WBC: 6.1 K/uL (ref 4.0–10.5)
nRBC: 0 % (ref 0.0–0.2)

## 2022-09-14 MED ORDER — NYSTATIN 100000 UNIT/ML MT SUSP: 2.0000 mL | Freq: Four times a day (QID) | OROMUCOSAL | 0 refills | Status: DC

## 2022-09-14 NOTE — Patient Instructions (Addendum)
Thank you for coming in today  Labs were done today, if any labs are abnormal the clinic will call you No news is good news   Your physician recommends that you schedule a follow-up appointment in:  4 months with Dr. Mclean  You will receive a reminder letter in the mail a few months in advance. If you don't receive a letter, please call our office to schedule the follow-up appointment.    Do the following things EVERYDAY: Weigh yourself in the morning before breakfast. Write it down and keep it in a log. Take your medicines as prescribed Eat low salt foods--Limit salt (sodium) to 2000 mg per day.  Stay as active as you can everyday Limit all fluids for the day to less than 2 liters  At the [facility], you and your health needs are our priority. As part of our continuing mission to provide you with exceptional heart care, we have created designated Provider Care Teams. These Care Teams include your primary Cardiologist (physician) and Advanced Practice Providers (APPs- Physician Assistants and Nurse Practitioners) who all work together to provide you with the care you need, when you need it.   You may see any of the following providers on your designated Care Team at your next follow up: Dr Daniel Bensimhon Dr Dalton McLean Dr. Aditya Sabharwal Amy Clegg, NP Brittainy Simmons, PA Jessica Milford,NP Lindsay Finch, PA Alma Diaz, NP Lauren Kemp, PharmD   Please be sure to bring in all your medications bottles to every appointment.    Thank you for choosing [facility]   If you have any questions or concerns before your next appointment please send us a message through mychart or call our office at [phone].    TO LEAVE A MESSAGE FOR THE NURSE SELECT OPTION 2, PLEASE LEAVE A MESSAGE INCLUDING: YOUR NAME DATE OF BIRTH CALL BACK NUMBER REASON FOR CALL**this is important as we prioritize the call backs  YOU WILL  RECEIVE A CALL BACK THE SAME DAY AS LONG AS YOU CALL BEFORE 4:00 PM  

## 2022-11-13 NOTE — Progress Notes (Unsigned)
[facility]: Clinic Note  Subjective:  History of Present Illness: Paul Bullock is a 77 y.o. year old male who presents for routine 2 month follow up. I saw him on 1/31.  # LBP - he'll consider PT referral. Capsaicin cream.   # weight gain  # ED- started silden 50  # tremor - he may want neuro referral, labs normal    HCM - needs to schedule colo   Please refer to Assessment and Plan below for full details in Problem-Based Charting.   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   Dyslipidemia 05/12/2022   Chronic systolic CHF (congestive heart failure) 05/12/2022   Acute respiratory failure with hypoxia 05/12/2022   Chronic kidney disease, stage 3a 05/12/2022   Obesity (BMI 30-39.9) 05/12/2022   COPD exacerbation 05/11/2022   COPD (chronic obstructive pulmonary disease) 03/28/2022   Healthcare maintenance 03/28/2022   Drug-induced gynecomastia 03/28/2022   Congestive heart failure 03/26/2022   Presence of automatic (implantable) cardiac defibrillator 03/26/2022   Primary malignant neoplasm of urethra 03/26/2022   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 Pertinent updates today include: ***  Family History: Reviewed in Epic. Pertinent updates today  include: None  Medications:  Current Outpatient Medications:    albuterol (PROVENTIL) (2.5 MG/3ML) 0.083% nebulizer solution, Take 3 mLs (2.5 mg total) by nebulization every 6 (six) hours as needed for wheezing or shortness of breath., Disp: 540 mL, Rfl: 3   apixaban (ELIQUIS) 2.5 MG TABS tablet, Take 1 tablet (2.5 mg total) by mouth 2 (two) times daily., Disp: 180 tablet, Rfl: 3   bisoprolol (ZEBETA) 5 MG tablet, Take 1.5 tablets (7.5 mg total) by mouth daily., Disp: 135 tablet, Rfl: 3   Capsaicin 0.035 % LOTN, Apply 1 Film topically 3 (three) times daily as needed (back pain). Apply thin film to lower back three times daily as needed for pain, Disp: 113 g, Rfl: 3   Cholecalciferol (VITAMIN D-3) 125 MCG (5000 UT) TABS, Take 5,000 Units by mouth daily., Disp: , Rfl:    clopidogrel (PLAVIX) 75 MG tablet, TAKE 1 TABLET DAILY, Disp: 90 tablet, Rfl: 3   fluticasone (FLONASE) 50 MCG/ACT nasal spray, Place 2 sprays into both nostrils daily. As  needed, Disp: , Rfl:    fluticasone furoate-vilanterol (BREO ELLIPTA) 100-25 MCG/ACT AEPB, Inhale into the lungs., Disp: , Rfl:    JARDIANCE 10 MG TABS tablet, TAKE 1 TABLET DAILY, Disp: 90 tablet, Rfl: 3   Multiple Vitamins-Minerals (MULTIVITAMIN WITH MINERALS) tablet, Take 1 tablet by mouth daily., Disp: , Rfl:    nitroGLYCERIN (NITROSTAT) 0.4 MG SL tablet, Place 1 tablet (0.4 mg total) under the tongue every 5 (five) minutes as needed for chest pain., Disp: 25 tablet, Rfl: 3   nystatin (MYCOSTATIN) 100000 UNIT/ML suspension, Take 2 mLs (200,000 Units total) by mouth 4 (four) times daily.,  Disp: 473 mL, Rfl: 0   Pitavastatin Calcium (LIVALO) 4 MG TABS, Take 1 tablet (4 mg total) by mouth daily., Disp: 90 tablet, Rfl: 3   sacubitril-valsartan (ENTRESTO) 49-51 MG, Take 1 tablet by mouth 2 (two) times daily., Disp: 180 tablet, Rfl: 3   sildenafil (VIAGRA) 50 MG tablet, Take 1 tablet (50 mg total) by mouth daily as needed for erectile dysfunction., Disp: 30 tablet,  Rfl: 0   spironolactone (ALDACTONE) 25 MG tablet, TAKE 1 TABLET DAILY, Disp: 90 tablet, Rfl: 3   torsemide (DEMADEX) 20 MG tablet, Take 0.5 tablets (10 mg total) by mouth every other day. Take an extra half tablet PRN for weight gain/edema. (3lbs in 1 day or 5 lbs in 1 week), Disp: 15 tablet, Rfl: 3   Allergies: Allergies  Allergen Reactions   Atorvastatin Other (See Comments)    Myalgias   Rosuvastatin Other (See Comments)    myalgias    Review of Systems: ROS   Objective:   Vitals: There were no vitals filed for this visit.  Physical Exam: Physical Exam   Data: Labs, imaging, and micro were reviewed in Epic. Refer to Assessment and Plan below for full details in Problem-Based Charting.  Assessment & Plan:  No problem-specific Assessment & Plan notes found for this encounter.     Patient will follow up in ***  Mercie Eon, MD

## 2022-11-14 ENCOUNTER — Ambulatory Visit (INDEPENDENT_AMBULATORY_CARE_PROVIDER_SITE_OTHER): Payer: Medicare Other | Admitting: Internal Medicine

## 2022-11-14 ENCOUNTER — Encounter: Payer: Self-pay | Admitting: Internal Medicine

## 2022-11-14 ENCOUNTER — Ambulatory Visit (INDEPENDENT_AMBULATORY_CARE_PROVIDER_SITE_OTHER): Payer: Medicare Other

## 2022-11-14 VITALS — BP 103/50 | HR 73 | Temp 97.7°F | Ht 70.0 in | Wt 261.7 lb

## 2022-11-14 VITALS — BP 103/50 | HR 73 | Temp 97.7°F | Ht 70.0 in | Wt 261.1 lb

## 2022-11-14 DIAGNOSIS — M545 Low back pain, unspecified: Secondary | ICD-10-CM | POA: Diagnosis present

## 2022-11-14 DIAGNOSIS — B370 Candidal stomatitis: Secondary | ICD-10-CM | POA: Diagnosis not present

## 2022-11-14 DIAGNOSIS — L304 Erythema intertrigo: Secondary | ICD-10-CM | POA: Diagnosis not present

## 2022-11-14 DIAGNOSIS — G8929 Other chronic pain: Secondary | ICD-10-CM

## 2022-11-14 DIAGNOSIS — Z0000 Encounter for general adult medical examination without abnormal findings: Secondary | ICD-10-CM

## 2022-11-14 DIAGNOSIS — Z1211 Encounter for screening for malignant neoplasm of colon: Secondary | ICD-10-CM

## 2022-11-14 MED ORDER — NYSTATIN 100000 UNIT/GM EX POWD: 1.0000 | Freq: Three times a day (TID) | CUTANEOUS | 0 refills | Status: DC

## 2022-11-14 NOTE — Patient Instructions (Signed)
Dear Mr Rask,  It was a pleasure seeing you today.  I have sent a nystatin powder in for your rash.  I have also placed a referral to physical therapy for your low back pain.  Please call to schedule your colonoscopy.   I'll see you back in about 4 months, sooner if needed.  Sincerely, Dr. Mercie Eon

## 2022-11-14 NOTE — Assessment & Plan Note (Signed)
-   He has a moist, red patch within the deep skin folds of his groin bilaterally. It is sometimes itchy and burning, associated with a malodorous discharge. He has no skin fissuring. Exam is consistent with intertrigo - I counseled him to get the area 100% dry after showering and bathing. Daily cleansing, followed by hair dryer on cool setting. Then using a barrier cream, such as petroleum jelly.  - I do not see active fungal infection, but since this is common, I have sent nystatin to his pharmacy

## 2022-11-14 NOTE — Patient Instructions (Addendum)
Health Maintenance, Male Adopting a healthy lifestyle and getting preventive care are important in promoting health and wellness. Ask your health care provider about: The right schedule for you to have regular tests and exams. Things you can do on your own to prevent diseases and keep yourself healthy. What should I know about diet, weight, and exercise? Eat a healthy diet  Eat a diet that includes plenty of vegetables, fruits, low-fat dairy products, and lean protein. Do not eat a lot of foods that are high in solid fats, added sugars, or sodium. Maintain a healthy weight Body mass index (BMI) is a measurement that can be used to identify possible weight problems. It estimates body fat based on height and weight. Your health care provider can help determine your BMI and help you achieve or maintain a healthy weight. Get regular exercise Get regular exercise. This is one of the most important things you can do for your health. Most adults should: Exercise for at least 150 minutes each week. The exercise should increase your heart rate and make you sweat (moderate-intensity exercise). Do strengthening exercises at least twice a week. This is in addition to the moderate-intensity exercise. Spend less time sitting. Even light physical activity can be beneficial. Watch cholesterol and blood lipids Have your blood tested for lipids and cholesterol at 75 years of age, then have this test every 5 years. You may need to have your cholesterol levels checked more often if: Your lipid or cholesterol levels are high. You are older than 75 years of age. You are at high risk for heart disease. What should I know about cancer screening? Many types of cancers can be detected early and may often be prevented. Depending on your health history and family history, you may need to have cancer screening at various ages. This may include screening for: Colorectal cancer. Prostate cancer. Skin cancer. Lung  cancer. What should I know about heart disease, diabetes, and high blood pressure? Blood pressure and heart disease High blood pressure causes heart disease and increases the risk of stroke. This is more likely to develop in people who have high blood pressure readings or are overweight. Talk with your health care provider about your target blood pressure readings. Have your blood pressure checked: Every 3-5 years if you are 18-39 years of age. Every year if you are 40 years old or older. If you are between the ages of 65 and 75 and are a current or former smoker, ask your health care provider if you should have a one-time screening for abdominal aortic aneurysm (AAA). Diabetes Have regular diabetes screenings. This checks your fasting blood sugar level. Have the screening done: Once every three years after age 45 if you are at a normal weight and have a low risk for diabetes. More often and at a younger age if you are overweight or have a high risk for diabetes. What should I know about preventing infection? Hepatitis B If you have a higher risk for hepatitis B, you should be screened for this virus. Talk with your health care provider to find out if you are at risk for hepatitis B infection. Hepatitis C Blood testing is recommended for: Everyone born from 1945 through 1965. Anyone with known risk factors for hepatitis C. Sexually transmitted infections (STIs) You should be screened each year for STIs, including gonorrhea and chlamydia, if: You are sexually active and are younger than 75 years of age. You are older than 75 years of age and your   health care provider tells you that you are at risk for this type of infection. Your sexual activity has changed since you were last screened, and you are at increased risk for chlamydia or gonorrhea. Ask your health care provider if you are at risk. Ask your health care provider about whether you are at high risk for HIV. Your health care provider  may recommend a prescription medicine to help prevent HIV infection. If you choose to take medicine to prevent HIV, you should first get tested for HIV. You should then be tested every 3 months for as long as you are taking the medicine. Follow these instructions at home: Alcohol use Do not drink alcohol if your health care provider tells you not to drink. If you drink alcohol: Limit how much you have to 0-2 drinks a day. Know how much alcohol is in your drink. In the [place] one drink equals one 12 oz bottle of beer (355 mL), one 5 oz glass of wine (148 mL), or one 1 oz glass of hard liquor (44 mL). Lifestyle Do not use any products that contain nicotine or tobacco. These products include cigarettes, chewing tobacco, and vaping devices, such as e-cigarettes. If you need help quitting, ask your health care provider. Do not use street drugs. Do not share needles. Ask your health care provider for help if you need support or information about quitting drugs. General instructions Schedule regular health, dental, and eye exams. Stay current with your vaccines. Tell your health care provider if: You often feel depressed. You have ever been abused or do not feel safe at home. Summary Adopting a healthy lifestyle and getting preventive care are important in promoting health and wellness. Follow your health care provider's instructions about healthy diet, exercising, and getting tested or screened for diseases. Follow your health care provider's instructions on monitoring your cholesterol and blood pressure. This information is not intended to replace advice given to you by your health care provider. Make sure you discuss any questions you have with your health care provider. Document Revised: 12/12/2020 Document Reviewed: 12/12/2020 Elsevier Patient Education  2023 [employer] Health Maintenance, Male Adopting a healthy lifestyle and getting preventive care are important in promoting health and  wellness. Ask your health care provider about: The right schedule for you to have regular tests and exams. Things you can do on your own to prevent diseases and keep yourself healthy. What should I know about diet, weight, and exercise? Eat a healthy diet  Eat a diet that includes plenty of vegetables, fruits, low-fat dairy products, and lean protein. Do not eat a lot of foods that are high in solid fats, added sugars, or sodium. Maintain a healthy weight Body mass index (BMI) is a measurement that can be used to identify possible weight problems. It estimates body fat based on height and weight. Your health care provider can help determine your BMI and help you achieve or maintain a healthy weight. Get regular exercise Get regular exercise. This is one of the most important things you can do for your health. Most adults should: Exercise for at least 150 minutes each week. The exercise should increase your heart rate and make you sweat (moderate-intensity exercise). Do strengthening exercises at least twice a week. This is in addition to the moderate-intensity exercise. Spend less time sitting. Even light physical activity can be beneficial. Watch cholesterol and blood lipids Have your blood tested for lipids and cholesterol at 75 years of age, then have this test   every 5 years. You may need to have your cholesterol levels checked more often if: Your lipid or cholesterol levels are high. You are older than 75 years of age. You are at high risk for heart disease. What should I know about cancer screening? Many types of cancers can be detected early and may often be prevented. Depending on your health history and family history, you may need to have cancer screening at various ages. This may include screening for: Colorectal cancer. Prostate cancer. Skin cancer. Lung cancer. What should I know about heart disease, diabetes, and high blood pressure? Blood pressure and heart disease High  blood pressure causes heart disease and increases the risk of stroke. This is more likely to develop in people who have high blood pressure readings or are overweight. Talk with your health care provider about your target blood pressure readings. Have your blood pressure checked: Every 3-5 years if you are 18-39 years of age. Every year if you are 40 years old or older. If you are between the ages of 65 and 75 and are a current or former smoker, ask your health care provider if you should have a one-time screening for abdominal aortic aneurysm (AAA). Diabetes Have regular diabetes screenings. This checks your fasting blood sugar level. Have the screening done: Once every three years after age 45 if you are at a normal weight and have a low risk for diabetes. More often and at a younger age if you are overweight or have a high risk for diabetes. What should I know about preventing infection? Hepatitis B If you have a higher risk for hepatitis B, you should be screened for this virus. Talk with your health care provider to find out if you are at risk for hepatitis B infection. Hepatitis C Blood testing is recommended for: Everyone born from 1945 through 1965. Anyone with known risk factors for hepatitis C. Sexually transmitted infections (STIs) You should be screened each year for STIs, including gonorrhea and chlamydia, if: You are sexually active and are younger than 75 years of age. You are older than 75 years of age and your health care provider tells you that you are at risk for this type of infection. Your sexual activity has changed since you were last screened, and you are at increased risk for chlamydia or gonorrhea. Ask your health care provider if you are at risk. Ask your health care provider about whether you are at high risk for HIV. Your health care provider may recommend a prescription medicine to help prevent HIV infection. If you choose to take medicine to prevent HIV, you  should first get tested for HIV. You should then be tested every 3 months for as long as you are taking the medicine. Follow these instructions at home: Alcohol use Do not drink alcohol if your health care provider tells you not to drink. If you drink alcohol: Limit how much you have to 0-2 drinks a day. Know how much alcohol is in your drink. In the [place] one drink equals one 12 oz bottle of beer (355 mL), one 5 oz glass of wine (148 mL), or one 1 oz glass of hard liquor (44 mL). Lifestyle Do not use any products that contain nicotine or tobacco. These products include cigarettes, chewing tobacco, and vaping devices, such as e-cigarettes. If you need help quitting, ask your health care provider. Do not use street drugs. Do not share needles. Ask your health care provider for help if you need support   or information about quitting drugs. General instructions Schedule regular health, dental, and eye exams. Stay current with your vaccines. Tell your health care provider if: You often feel depressed. You have ever been abused or do not feel safe at home. Summary Adopting a healthy lifestyle and getting preventive care are important in promoting health and wellness. Follow your health care provider's instructions about healthy diet, exercising, and getting tested or screened for diseases. Follow your health care provider's instructions on monitoring your cholesterol and blood pressure. This information is not intended to replace advice given to you by your health care provider. Make sure you discuss any questions you have with your health care provider. Document Revised: 12/12/2020 Document Reviewed: 12/12/2020 Elsevier Patient Education  2023 [employer] Inc[employer]  

## 2022-11-14 NOTE — Assessment & Plan Note (Signed)
-   This is in the setting of using an inhaled corticosteroid. He intermittently rinses mouth after use. White plaques scrape off easily on exam. I think this is oral thrush. He has nystatin swish & spit to use. Counseled him to rinse mouth out after every ICS use

## 2022-11-14 NOTE — Assessment & Plan Note (Signed)
-   He will get the covid booster through his outside pharmacy before his son's upcoming wedding in June - He will call to schedule his colonoscopy

## 2022-11-14 NOTE — Assessment & Plan Note (Signed)
-   This problem is chronic and worsening - He has a 5/10 baseline pain on his left and right lower back. No midline pain. It is worse with prolonged standing and activity, better with rest, heat pads, and capsaicin cream. It does not radiate. It is not associated with numbness, tingling, or weakness of his legs. - On exam, he has no midline pain, +bilateral paraspinal muscle tenderness, and 5/5 strength in bilateral lower extremities. - I think this is MSK pain. PT referral today - Continue Capsaicin cream and heat as needed. We discussed Robaxin prn, but decided to start with PT

## 2022-11-14 NOTE — Progress Notes (Signed)
Subjective:   Paul Bullock is a 77 y.o. male who presents for an Initial Medicare Annual Wellness Visit. I connected with  Paul Bullock on 11/14/22 by a  IN PERSON     Patient Location: Other:  IN PERSON   Provider Location: Office/Clinic  I discussed the limitations of evaluation and management by telemedicine. The patient expressed understanding and agreed to proceed.   Review of Systems    DEFERRED TO PCP    Cardiac Risk Factors include: advanced age (>50men, >87 women);diabetes mellitus;smoking/ tobacco exposure     Objective:    Today's Vitals   11/14/22 1345 11/14/22 1346  BP: (!) 103/50   Pulse: 73   Temp: 97.7 F (36.5 C)   TempSrc: Oral   SpO2: 100%   Weight: 261 lb 1.6 oz (118.4 kg)   Height: 5\' 10"  (1.778 m)   PainSc:  0-No pain   Body mass index is 37.46 kg/m.     11/14/2022    1:50 PM 11/14/2022   10:55 AM 05/11/2022    7:18 PM 03/28/2022   10:48 AM 01/18/2022    2:37 PM 12/28/2021    2:55 PM 12/20/2021    2:49 PM  Advanced Directives  Does Patient Have a Medical Advance Directive? No No No No No No No  Does patient want to make changes to medical advance directive?  No - Patient declined       Would patient like information on creating a medical advance directive? No - Patient declined No - Patient declined No - Patient declined No - Patient declined No - Patient declined No - Patient declined No - Patient declined    Current Medications (verified) Outpatient Encounter Medications as of 11/14/2022  Medication Sig   albuterol (PROVENTIL) (2.5 MG/3ML) 0.083% nebulizer solution Take 3 mLs (2.5 mg total) by nebulization every 6 (six) hours as needed for wheezing or shortness of breath.   apixaban (ELIQUIS) 2.5 MG TABS tablet Take 1 tablet (2.5 mg total) by mouth 2 (two) times daily.   bisoprolol (ZEBETA) 5 MG tablet Take 1.5 tablets (7.5 mg total) by mouth daily.   Capsaicin 0.035 % LOTN Apply 1 Film topically 3 (three) times daily as needed (back pain).  Apply thin film to lower back three times daily as needed for pain   Cholecalciferol (VITAMIN D-3) 125 MCG (5000 UT) TABS Take 5,000 Units by mouth daily.   clopidogrel (PLAVIX) 75 MG tablet TAKE 1 TABLET DAILY   fluticasone (FLONASE) 50 MCG/ACT nasal spray Place 2 sprays into both nostrils daily. As  needed   fluticasone furoate-vilanterol (BREO ELLIPTA) 100-25 MCG/ACT AEPB Inhale into the lungs.   JARDIANCE 10 MG TABS tablet TAKE 1 TABLET DAILY   Multiple Vitamins-Minerals (MULTIVITAMIN WITH MINERALS) tablet Take 1 tablet by mouth daily.   nitroGLYCERIN (NITROSTAT) 0.4 MG SL tablet Place 1 tablet (0.4 mg total) under the tongue every 5 (five) minutes as needed for chest pain.   nystatin (MYCOSTATIN) 100000 UNIT/ML suspension Take 2 mLs (200,000 Units total) by mouth 4 (four) times daily.   nystatin powder Apply 1 Application topically 3 (three) times daily.   Pitavastatin Calcium (LIVALO) 4 MG TABS Take 1 tablet (4 mg total) by mouth daily.   sacubitril-valsartan (ENTRESTO) 49-51 MG Take 1 tablet by mouth 2 (two) times daily.   sildenafil (VIAGRA) 50 MG tablet Take 1 tablet (50 mg total) by mouth daily as needed for erectile dysfunction.   spironolactone (ALDACTONE) 25 MG tablet TAKE 1 TABLET DAILY  torsemide (DEMADEX) 20 MG tablet Take 0.5 tablets (10 mg total) by mouth every other day. Take an extra half tablet PRN for weight gain/edema. (3lbs in 1 day or 5 lbs in 1 week)   No facility-administered encounter medications on file as of 11/14/2022.    Allergies (verified) Atorvastatin and Rosuvastatin   History: Past Medical History:  Diagnosis Date   AICD (automatic cardioverter/defibrillator) present    a. 11/2014 s/p MDT DDMB 1D4 [facility] MRI XT DR AICD (ser# [id number]).   Bladder cancer    a. 2013 s/p chemo/xrt.   CAD (coronary artery disease)    a. 11/2020 s/p MI/cardiac arrest-->DES to LCX x 1; b. 10/2011 Cath: LAD 45m w/ aneurysmal post-stenotic dilation-->med rx. LCX 50d to prior  stent; c. 10/2021 NSTEMI/Cath: LM 30ost/m, LAD 90p/m, 82m, LCX 20p/m, patent stent, LPAV 60, RCA mild diff dzs, EF<25%, LVEDP .   Chronic HFrEF (heart failure with reduced ejection fraction)    a. EF prev as low as 20% w/ subsequent improvement to 50-55%; b. 11/2020 limited echo: "windows were challenging...LVEF largely preserved."; c. 10/2021 LV gram: EF <25%, LVEDP .   COPD (chronic obstructive pulmonary disease)    Depression    Diverticulitis    DVT (deep venous thrombosis)    Essential hypertension    Hyperlipidemia LDL goal <70    Ischemic cardiomyopathy    a. EF prev as low as 20%; b. 11/2014 s/p MDT [id number] [facility] MRI XT DR AICD; c. subsequent improvement in EF to 50-55%; d. 11/2020 limited echo: "windows were challenging...LVEF largely preserved."; e. 10/2021 LV Gram: EF <25%.   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   Thrombocytopenia    Tobacco abuse    Varicose veins of left lower extremity    Past Surgical History:  Procedure Laterality Date   CARDIAC DEFIBRILLATOR PLACEMENT     CORONARY STENT INTERVENTION N/A 10/18/2021   Procedure: CORONARY STENT INTERVENTION;  Surgeon: Iran Ouch, MD;  Location: [facility];  Service: Cardiovascular;  Laterality: N/A;  Prox and Mid LAD   MOHS SURGERY     PERCUTANEOUS CORONARY STENT INTERVENTION (PCI-S)     RIGHT/LEFT HEART CATH AND CORONARY ANGIOGRAPHY N/A 10/13/2021   Procedure: RIGHT/LEFT HEART CATH AND CORONARY ANGIOGRAPHY;  Surgeon: Iran Ouch, MD;  Location: [facility] INVASIVE CV [facility];  Service: Cardiovascular;  Laterality: N/A;   VASECTOMY     VASECTOMY REVERSAL     Family History  Problem Relation Age of Onset   Stroke Mother    Bladder Cancer Father    Heart attack Brother    Social History   Socioeconomic History   Marital status: Single    Spouse name: Not on file   Number of children: Not on file   Years of  education: Not on file   Highest education level: Not on file  Occupational History   Not on file  Tobacco Use   Smoking status: Every Day    Packs/day: 0.75    Years: 55.00    Additional pack years: 0.00    Total pack years: 41.25    Types: Cigarettes   Smokeless tobacco: Never   Tobacco comments:    10 cigs per day  Vaping Use   Vaping Use: Never used  Substance and Sexual Activity   Alcohol use: Never   Drug use: Never   Sexual activity: Not Currently  Other Topics Concern   Not on file  Social History Narrative   Since Dec 2022, he has been living in [place]  His ex-wife lives here and they are very close friends.  He does not routinely exercise.   Social Determinants of Health   Financial Resource Strain: Low Risk  (11/14/2022)   Overall Financial Resource Strain (CARDIA)    Difficulty of Paying Living Expenses: Not hard at all  Food Insecurity: No Food Insecurity (11/14/2022)   Hunger Vital Sign    Worried About Running Out of Food in the Last Year: Never true    Ran Out of Food in the Last Year: Never true  Transportation Needs: No Transportation Needs (11/14/2022)   PRAPARE - Administrator, Civil Service (Medical): No    Lack of Transportation (Non-Medical): No  Physical Activity: Insufficiently Active (11/14/2022)   Exercise Vital Sign    Days of Exercise per Week: 2 days    Minutes of Exercise per Session: 30 min  Stress: No Stress Concern Present (11/14/2022)   [employer] of Occupational Health - Occupational Stress Questionnaire    Feeling of Stress : Not at all  Social Connections: Socially Isolated (11/14/2022)   Social Connection and Isolation Panel [NHANES]    Frequency of Communication with Friends and Family: More than three times a week    Frequency of Social Gatherings with Friends and Family: Once a week    Attends Religious Services: Never    Database administrator or Organizations: No    Attends Engineer, structural:  Never    Marital Status: Divorced    Tobacco Counseling Ready to quit: Not Answered Counseling given: Not Answered Tobacco comments: 10 cigs per day   Clinical Intake:  Pre-visit preparation completed: Yes  Pain : No/denies pain Pain Score: 0-No pain     BMI - recorded: 37 Nutritional Status: BMI > 30  Obese Nutritional Risks: None Diabetes: Yes CBG done?: No CBG resulted in Enter/ Edit results?: No Did pt. bring in CBG monitor from home?: No  How often do you need to have someone help you when you read instructions, pamphlets, or other written materials from your doctor or pharmacy?: 1 - Never What is the last grade level you completed in school?: COLLEGE  Diabetic?YES   Interpreter Needed?: No  Information entered by :: [facility] Kynadie Yaun   Activities of Daily Living    11/14/2022    1:50 PM 11/14/2022   10:49 AM  In your present state of health, do you have any difficulty performing the following activities:  Hearing? 0 0  Vision? 0 0  Difficulty concentrating or making decisions? 0 0  Walking or climbing stairs? 0 0  Dressing or bathing? 0 0  Doing errands, shopping? 0 0  Preparing Food and eating ? N   Using the Toilet? N   In the past six months, have you accidently leaked urine? N   Do you have problems with loss of bowel control? N   Managing your Medications? N   Managing your Finances? N   Housekeeping or managing your Housekeeping? N     Patient Care Team: Mercie Eon, MD as PCP - General (Internal Medicine) Iran Ouch, MD as PCP - Cardiology (Cardiology)  Indicate any recent Medical Services you may have received from other than [facility] providers in the past year (date may be approximate).     Assessment:   This is a routine wellness examination for Theodis.  Hearing/Vision screen No results found.  Dietary issues and exercise activities discussed: Current Exercise Habits: Home exercise routine, Type of exercise: walking, Time  (Minutes): 30, Frequency (Times/Week): 2, Weekly Exercise (Minutes/Week): 60, Intensity: Mild   Goals Addressed   None   Depression Screen    11/14/2022   10:49 AM 03/28/2022   10:49 AM 12/20/2021    2:49 PM 12/05/2021    9:26 AM  PHQ 2/9 Scores  PHQ - 2 Score 0 0 0 0  PHQ- 9 Score  0  0    Fall Risk    11/14/2022    1:50 PM 11/14/2022   10:49 AM 03/28/2022   10:49 AM 12/20/2021    2:49 PM 12/05/2021    9:26 AM  Fall Risk   Falls in the past year? 0 0 0 0 0  Number falls in past yr: 0 0 0  0  Injury with Fall? 0 0 0  0  Risk for fall due to : No Fall Risks   No Fall Risks No Fall Risks  Follow up Falls prevention discussed;Falls evaluation completed Falls evaluation completed Falls evaluation completed Falls evaluation completed Falls evaluation completed;Falls prevention discussed    FALL RISK PREVENTION PERTAINING TO THE HOME:  Any stairs in or around the home? Yes  If so, are there any without handrails? No  Home free of loose throw rugs in walkways, pet beds, electrical cords, etc? Yes  Adequate lighting in your home to reduce risk of falls? Yes   ASSISTIVE DEVICES UTILIZED TO PREVENT FALLS:  Life alert? No  Use of a cane, walker or w/c? No  Grab bars in the bathroom? No  Shower chair or bench in shower? No  Elevated toilet seat or a handicapped toilet? No   TIMED UP AND GO:  Was the test performed? No .  Length of time to ambulate 10 feet: N/A sec.     Cognitive Function:        11/14/2022    1:51 PM  6CIT Screen  What Year? 0 points  What month? 0 points  What time? 0 points  Count back from 20 0 points  Months in reverse 0 points  Repeat phrase 0 points  Total Score 0 points    Immunizations Immunization History  Administered Date(s) Administered   Fluad Quad(high Dose 65+) 09/05/2022   PFIZER Comirnaty(Gray Top)Covid-19 Tri-Sucrose Vaccine 01/31/2021   PFIZER(Purple Top)SARS-COV-2 Vaccination 05/23/2020   PNEUMOCOCCAL CONJUGATE-20 12/05/2021     TDAP status: Due, Education has been provided regarding the importance of this vaccine. Advised may receive this vaccine at local pharmacy or Health Dept. Aware to provide a copy of the vaccination record if obtained from local pharmacy or Health Dept. Verbalized acceptance and understanding.  Flu Vaccine status: Up to date  Pneumococcal vaccine status: Up to date  Covid-19 vaccine status: Completed vaccines  Qualifies for Shingles Vaccine? Yes   Zostavax completed No   Shingrix Completed?: No.    Education has been provided regarding the importance of this vaccine. Patient has been advised to call insurance company to determine out of pocket expense if they have not yet received this vaccine. Advised may also receive vaccine at local pharmacy or Health Dept. Verbalized acceptance and understanding.  Screening Tests Health Maintenance  Topic Date Due   Hepatitis C Screening  Never done   DTaP/Tdap/Td (1 - Tdap) Never done   Zoster Vaccines- Shingrix (1 of 2) Never done   COLONOSCOPY (Pts 45-38yrs Insurance coverage will  need to be confirmed)  Never done   COVID-19 Vaccine (5 - 2023-24 season) 04/06/2022   OPHTHALMOLOGY EXAM  11/15/2022   FOOT EXAM  12/06/2022   HEMOGLOBIN A1C  03/06/2023   INFLUENZA VACCINE  03/07/2023   Lung Cancer Screening  05/12/2023   Diabetic kidney evaluation - Urine ACR  09/06/2023   Diabetic kidney evaluation - eGFR measurement  09/15/2023   Medicare Annual Wellness (AWV)  11/14/2023   Pneumonia Vaccine 63+ Years old  Completed   HPV VACCINES  Aged Out    Health Maintenance  Health Maintenance Due  Topic Date Due   Hepatitis C Screening  Never done   DTaP/Tdap/Td (1 - Tdap) Never done   Zoster Vaccines- Shingrix (1 of 2) Never done   COLONOSCOPY (Pts 45-31yrs Insurance coverage will need to be confirmed)  Never done   COVID-19 Vaccine (5 - 2023-24 season) 04/06/2022    Colorectal cancer screening: DEFERRED TO PCP    Lung Cancer Screening:  (Low Dose CT Chest recommended if Age 29-80 years, 30 pack-year currently smoking OR have quit w/in 15years.) does qualify.   Lung Cancer Screening Referral: DEFERRED TO PCP   Additional Screening:  Hepatitis C Screening: does qualify; Completed DEFERRED TO PCP   Vision Screening: Recommended annual ophthalmology exams for early detection of glaucoma and other disorders of the eye. Is the patient up to date with their annual eye exam?  Yes  Who is the provider or what is the name of the office in which the patient attends annual eye exams? [facility]  If pt is not established with a provider, would they like to be referred to a provider to establish care? No .   Dental Screening: Recommended annual dental exams for proper oral hygiene  Community Resource Referral / Chronic Care Management: CRR required this visit?  No   CCM required this visit?  No      Plan:     I have personally reviewed and noted the following in the patient's chart:   Medical and social history Use of alcohol, tobacco or illicit drugs  Current medications and supplements including opioid prescriptions. Patient is not currently taking opioid prescriptions. Functional ability and status Nutritional status Physical activity Advanced directives List of other physicians Hospitalizations, surgeries, and ER visits in previous 12 months Vitals Screenings to include cognitive, depression, and falls Referrals and appointments  In addition, I have reviewed and discussed with patient certain preventive protocols, quality metrics, and best practice recommendations. A written personalized care plan for preventive services as well as general preventive health recommendations were provided to patient.     Derrell Lolling, CMA   11/14/2022   Nurse Notes: IN PERSON   Mr. Ezzell , Thank you for taking time to come for your Medicare Wellness Visit. I appreciate your ongoing commitment to your health goals.  Please review the following plan we discussed and let me know if I can assist you in the future.   These are the goals we discussed:  Goals   None     This is a list of the screening recommended for you and due dates:  Health Maintenance  Topic Date Due   Hepatitis C Screening: USPSTF Recommendation to screen - Ages 22-79 yo.  Never done   DTaP/Tdap/Td vaccine (1 - Tdap) Never done   Zoster (Shingles) Vaccine (1 of 2) Never done   Colon Cancer Screening  Never done   COVID-19 Vaccine (5 - 2023-24 season) 04/06/2022  Eye exam for diabetics  11/15/2022   Complete foot exam   12/06/2022   Hemoglobin A1C  03/06/2023   Flu Shot  03/07/2023   Screening for Lung Cancer  05/12/2023   Yearly kidney health urinalysis for diabetes  09/06/2023   Yearly kidney function blood test for diabetes  09/15/2023   Medicare Annual Wellness Visit  11/14/2023   Pneumonia Vaccine  Completed   HPV Vaccine  Aged Out

## 2022-11-14 NOTE — Progress Notes (Deleted)
Annual Wellness Visit     Patient: Paul Bullock, Male    DOB: 1946/08/03, 77 y.o.   MRN: [id number]  Subjective  No chief complaint on file.   Paul Bullock is a 77 y.o. male who presents today for his Annual Wellness Visit. He reports consuming a  diet.  He generally feels . He reports sleeping {well/fairly well/poorly:18703}. He  have additional problems to discuss today.   HPI  {VISON DENTAL STD PSA (Optional):27386}   {History (Optional):23778}  Medications: Outpatient Medications Prior to Visit  Medication Sig   albuterol (PROVENTIL) (2.5 MG/3ML) 0.083% nebulizer solution Take 3 mLs (2.5 mg total) by nebulization every 6 (six) hours as needed for wheezing or shortness of breath.   apixaban (ELIQUIS) 2.5 MG TABS tablet Take 1 tablet (2.5 mg total) by mouth 2 (two) times daily.   bisoprolol (ZEBETA) 5 MG tablet Take 1.5 tablets (7.5 mg total) by mouth daily.   Capsaicin 0.035 % LOTN Apply 1 Film topically 3 (three) times daily as needed (back pain). Apply thin film to lower back three times daily as needed for pain   Cholecalciferol (VITAMIN D-3) 125 MCG (5000 UT) TABS Take 5,000 Units by mouth daily.   clopidogrel (PLAVIX) 75 MG tablet TAKE 1 TABLET DAILY   fluticasone (FLONASE) 50 MCG/ACT nasal spray Place 2 sprays into both nostrils daily. As  needed   fluticasone furoate-vilanterol (BREO ELLIPTA) 100-25 MCG/ACT AEPB Inhale into the lungs.   JARDIANCE 10 MG TABS tablet TAKE 1 TABLET DAILY   Multiple Vitamins-Minerals (MULTIVITAMIN WITH MINERALS) tablet Take 1 tablet by mouth daily.   nitroGLYCERIN (NITROSTAT) 0.4 MG SL tablet Place 1 tablet (0.4 mg total) under the tongue every 5 (five) minutes as needed for chest pain.   nystatin (MYCOSTATIN) 100000 UNIT/ML suspension Take 2 mLs (200,000 Units total) by mouth 4 (four) times daily.   nystatin powder Apply 1 Application topically 3 (three) times daily.   Pitavastatin Calcium (LIVALO) 4 MG TABS Take 1 tablet (4 mg total) by  mouth daily.   sacubitril-valsartan (ENTRESTO) 49-51 MG Take 1 tablet by mouth 2 (two) times daily.   sildenafil (VIAGRA) 50 MG tablet Take 1 tablet (50 mg total) by mouth daily as needed for erectile dysfunction.   spironolactone (ALDACTONE) 25 MG tablet TAKE 1 TABLET DAILY   torsemide (DEMADEX) 20 MG tablet Take 0.5 tablets (10 mg total) by mouth every other day. Take an extra half tablet PRN for weight gain/edema. (3lbs in 1 day or 5 lbs in 1 week)   No facility-administered medications prior to visit.    Allergies  Allergen Reactions   Atorvastatin Other (See Comments)    Myalgias   Rosuvastatin Other (See Comments)    myalgias    Patient Care Team: Mercie Eon, MD as PCP - General (Internal Medicine) Iran Ouch, MD as PCP - Cardiology (Cardiology)  ROS      Objective  There were no vitals taken for this visit. {Vitals History (Optional):23777}  Physical Exam    Most recent functional status assessment:    11/14/2022   10:49 AM  In your present state of health, do you have any difficulty performing the following activities:  Hearing? 0  Vision? 0  Difficulty concentrating or making decisions? 0  Walking or climbing stairs? 0  Dressing or bathing? 0  Doing errands, shopping? 0   Most recent fall risk assessment:    11/14/2022   10:49 AM  Fall Risk   Falls in the past  year? 0  Number falls in past yr: 0  Injury with Fall? 0  Follow up Falls evaluation completed    Most recent depression screenings:    11/14/2022   10:49 AM 03/28/2022   10:49 AM  PHQ 2/9 Scores  PHQ - 2 Score 0 0  PHQ- 9 Score  0   Most recent cognitive screening:     No data to display         Most recent Audit-C alcohol use screening     No data to display         A score of 3 or more in women, and 4 or more in men indicates increased risk for alcohol abuse, EXCEPT if all of the points are from question 1   Vision/Hearing Screen: No results found.  {Labs  (Optional):23779}  No results found for any visits on 11/14/22.    Assessment & Plan   Annual wellness visit done today including the all of the following: Reviewed patient's Family Medical History Reviewed and updated list of patient's medical providers Assessment of cognitive impairment was done Assessed patient's functional ability Established a written schedule for health screening services Health Risk Assessent Completed and Reviewed  Exercise Activities and Dietary recommendations  Goals   None     Immunization History  Administered Date(s) Administered   Fluad Quad(high Dose 65+) 09/05/2022   PFIZER Comirnaty(Gray Top)Covid-19 Tri-Sucrose Vaccine 01/31/2021   PFIZER(Purple Top)SARS-COV-2 Vaccination 05/23/2020   PNEUMOCOCCAL CONJUGATE-20 12/05/2021    Health Maintenance  Topic Date Due   Hepatitis C Screening  Never done   DTaP/Tdap/Td (1 - Tdap) Never done   Zoster Vaccines- Shingrix (1 of 2) Never done   COLONOSCOPY (Pts 45-43yrs Insurance coverage will need to be confirmed)  Never done   COVID-19 Vaccine (5 - 2023-24 season) 04/06/2022   OPHTHALMOLOGY EXAM  11/15/2022   FOOT EXAM  12/06/2022   HEMOGLOBIN A1C  03/06/2023   INFLUENZA VACCINE  03/07/2023   Lung Cancer Screening  05/12/2023   Diabetic kidney evaluation - Urine ACR  09/06/2023   Diabetic kidney evaluation - eGFR measurement  09/15/2023   Medicare Annual Wellness (AWV)  11/14/2023   Pneumonia Vaccine 95+ Years old  Completed   HPV VACCINES  Aged Out     Discussed health benefits of physical activity, and encouraged him to engage in regular exercise appropriate for his age and condition.    Problem List Items Addressed This Visit   None Visit Diagnoses     Encounter for Medicare annual wellness exam    -  Primary       Return in 1 year (on 11/14/2023).     Paul Lolling, CMA     Subjective:   Paul Bullock is a 77 y.o. male who presents for an Initial Medicare Annual Wellness  Visit. I connected with  Paul Bullock on 11/14/22 by a  IN  PERSON [facility]   Patient Location : [facility]   Provider Location: Office/Clinic  I discussed the limitations of evaluation and management by telemedicine. The patient expressed understanding and agreed to proceed.   Review of Systems    DEFERRED TO PCP        Objective:    There were no vitals filed for this visit. There is no height or weight on file to calculate BMI.     11/14/2022   10:55 AM 05/11/2022    7:18 PM 03/28/2022   10:48 AM 01/18/2022    2:37 PM 12/28/2021  2:55 PM 12/20/2021    2:49 PM 12/05/2021    9:26 AM  Advanced Directives  Does Patient Have a Medical Advance Directive? No No No No No No No  Does patient want to make changes to medical advance directive? No - Patient declined        Would patient like information on creating a medical advance directive? No - Patient declined No - Patient declined No - Patient declined No - Patient declined No - Patient declined No - Patient declined No - Patient declined    Current Medications (verified) Outpatient Encounter Medications as of 11/14/2022  Medication Sig   albuterol (PROVENTIL) (2.5 MG/3ML) 0.083% nebulizer solution Take 3 mLs (2.5 mg total) by nebulization every 6 (six) hours as needed for wheezing or shortness of breath.   apixaban (ELIQUIS) 2.5 MG TABS tablet Take 1 tablet (2.5 mg total) by mouth 2 (two) times daily.   bisoprolol (ZEBETA) 5 MG tablet Take 1.5 tablets (7.5 mg total) by mouth daily.   Capsaicin 0.035 % LOTN Apply 1 Film topically 3 (three) times daily as needed (back pain). Apply thin film to lower back three times daily as needed for pain   Cholecalciferol (VITAMIN D-3) 125 MCG (5000 UT) TABS Take 5,000 Units by mouth daily.   clopidogrel (PLAVIX) 75 MG tablet TAKE 1 TABLET DAILY   fluticasone (FLONASE) 50 MCG/ACT nasal spray Place 2 sprays into both nostrils daily. As  needed   fluticasone furoate-vilanterol (BREO ELLIPTA) 100-25  MCG/ACT AEPB Inhale into the lungs.   JARDIANCE 10 MG TABS tablet TAKE 1 TABLET DAILY   Multiple Vitamins-Minerals (MULTIVITAMIN WITH MINERALS) tablet Take 1 tablet by mouth daily.   nitroGLYCERIN (NITROSTAT) 0.4 MG SL tablet Place 1 tablet (0.4 mg total) under the tongue every 5 (five) minutes as needed for chest pain.   nystatin (MYCOSTATIN) 100000 UNIT/ML suspension Take 2 mLs (200,000 Units total) by mouth 4 (four) times daily.   nystatin powder Apply 1 Application topically 3 (three) times daily.   Pitavastatin Calcium (LIVALO) 4 MG TABS Take 1 tablet (4 mg total) by mouth daily.   sacubitril-valsartan (ENTRESTO) 49-51 MG Take 1 tablet by mouth 2 (two) times daily.   sildenafil (VIAGRA) 50 MG tablet Take 1 tablet (50 mg total) by mouth daily as needed for erectile dysfunction.   spironolactone (ALDACTONE) 25 MG tablet TAKE 1 TABLET DAILY   torsemide (DEMADEX) 20 MG tablet Take 0.5 tablets (10 mg total) by mouth every other day. Take an extra half tablet PRN for weight gain/edema. (3lbs in 1 day or 5 lbs in 1 week)   No facility-administered encounter medications on file as of 11/14/2022.    Allergies (verified) Atorvastatin and Rosuvastatin   History: Past Medical History:  Diagnosis Date   AICD (automatic cardioverter/defibrillator) present    a. 11/2014 s/p MDT DDMB 1D4 [facility] MRI XT DR AICD (ser# [id number]).   Bladder cancer    a. 2013 s/p chemo/xrt.   CAD (coronary artery disease)    a. 11/2020 s/p MI/cardiac arrest-->DES to LCX x 1; b. 10/2011 Cath: LAD 34m w/ aneurysmal post-stenotic dilation-->med rx. LCX 50d to prior stent; c. 10/2021 NSTEMI/Cath: LM 30ost/m, LAD 90p/m, 40m, LCX 20p/m, patent stent, LPAV 60, RCA mild diff dzs, EF<25%, LVEDP .   Chronic HFrEF (heart failure with reduced ejection fraction)    a. EF prev as low as 20% w/ subsequent improvement to 50-55%; b. 11/2020 limited echo: "windows were challenging...LVEF largely preserved."; c. 10/2021 LV gram: EF <25%,  LVEDP .   COPD (chronic obstructive pulmonary disease)    Depression    Diverticulitis    DVT (deep venous thrombosis)    Essential hypertension    Hyperlipidemia LDL goal <70    Ischemic cardiomyopathy    a. EF prev as low as 20%; b. 11/2014 s/p MDT [id number] [facility] MRI XT DR AICD; c. subsequent improvement in EF to 50-55%; d. 11/2020 limited echo: "windows were challenging...LVEF largely preserved."; e. 10/2021 LV Gram: EF <25%.   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   Thrombocytopenia    Tobacco abuse    Varicose veins of left lower extremity    Past Surgical History:  Procedure Laterality Date   CARDIAC DEFIBRILLATOR PLACEMENT     CORONARY STENT INTERVENTION N/A 10/18/2021   Procedure: CORONARY STENT INTERVENTION;  Surgeon: Iran Ouch, MD;  Location: [facility] INVASIVE CV LAB;  Service: Cardiovascular;  Laterality: N/A;  Prox and Mid LAD   MOHS SURGERY     PERCUTANEOUS CORONARY STENT INTERVENTION (PCI-S)     RIGHT/LEFT HEART CATH AND CORONARY ANGIOGRAPHY N/A 10/13/2021   Procedure: RIGHT/LEFT HEART CATH AND CORONARY ANGIOGRAPHY;  Surgeon: Iran Ouch, MD;  Location: [facility] INVASIVE CV LAB;  Service: Cardiovascular;  Laterality: N/A;   VASECTOMY     VASECTOMY REVERSAL     Family History  Problem Relation Age of Onset   Stroke Mother    Bladder Cancer Father    Heart attack Brother    Social History   Socioeconomic History   Marital status: Single    Spouse name: Not on file   Number of children: Not on file   Years of education: Not on file   Highest education level: Not on file  Occupational History   Not on file  Tobacco Use   Smoking status: Every Day    Packs/day: 0.75    Years: 55.00    Additional pack years: 0.00    Total pack years: 41.25    Types: Cigarettes   Smokeless tobacco: Never   Tobacco comments:    10 cigs per day  Vaping Use   Vaping Use: Never used   Substance and Sexual Activity   Alcohol use: Never   Drug use: Never   Sexual activity: Not Currently  Other Topics Concern   Not on file  Social History Narrative   Since Dec 2022, he has been living in [place].  His ex-wife lives here and they are very close friends.  He does not routinely exercise.   Social Determinants of Health   Financial Resource Strain: Not on file  Food Insecurity: No Food Insecurity (05/12/2022)   Hunger Vital Sign    Worried About Running Out of Food in the Last Year: Never true    Ran Out of Food in the Last Year: Never true  Transportation Needs: No Transportation Needs (05/12/2022)   PRAPARE - Administrator, Civil Service (Medical): No    Lack of Transportation (Non-Medical): No  Physical Activity: Not on file  Stress: Not on file  Social Connections: Not on file    Tobacco Counseling Ready to quit: Not Answered Counseling given: Not Answered Tobacco comments: 10 cigs per day   Clinical Intake:                 Diabetic?YES          Activities  of Daily Living    11/14/2022   10:49 AM 05/12/2022    5:29 PM  In your present state of health, do you have any difficulty performing the following activities:  Hearing? 0 0  Vision? 0 0  Difficulty concentrating or making decisions? 0 0  Walking or climbing stairs? 0 0  Dressing or bathing? 0 0  Doing errands, shopping? 0 0    Patient Care Team: Mercie Eon, MD as PCP - General (Internal Medicine) Iran Ouch, MD as PCP - Cardiology (Cardiology)  Indicate any recent Medical Services you may have received from other than [facility] providers in the past year (date may be approximate).     Assessment:   This is a routine wellness examination for Paul Bullock.  Hearing/Vision screen No results found.  Dietary issues and exercise activities discussed:     Goals Addressed   None   Depression Screen    11/14/2022   10:49 AM 03/28/2022   10:49 AM 12/20/2021     2:49 PM 12/05/2021    9:26 AM  PHQ 2/9 Scores  PHQ - 2 Score 0 0 0 0  PHQ- 9 Score  0  0    Fall Risk    11/14/2022   10:49 AM 03/28/2022   10:49 AM 12/20/2021    2:49 PM 12/05/2021    9:26 AM  Fall Risk   Falls in the past year? 0 0 0 0  Number falls in past yr: 0 0  0  Injury with Fall? 0 0  0  Risk for fall due to :   No Fall Risks No Fall Risks  Follow up Falls evaluation completed Falls evaluation completed Falls evaluation completed Falls evaluation completed;Falls prevention discussed    FALL RISK PREVENTION PERTAINING TO THE HOME:  Any stairs in or around the home? Yes  If so, are there any without handrails? No  Home free of loose throw rugs in walkways, pet beds, electrical cords, etc? Yes  Adequate lighting in your home to reduce risk of falls? Yes   ASSISTIVE DEVICES UTILIZED TO PREVENT FALLS:  Life alert? No  Use of a cane, walker or w/c? No  Grab bars in the bathroom? No  Shower chair or bench in shower? No  Elevated toilet seat or a handicapped toilet? No   TIMED UP AND GO:  Was the test performed? No .  Length of time to ambulate 10 feet: N/A sec.     Cognitive Function:        Immunizations Immunization History  Administered Date(s) Administered   Fluad Quad(high Dose 65+) 09/05/2022   PFIZER Comirnaty(Gray Top)Covid-19 Tri-Sucrose Vaccine 01/31/2021   PFIZER(Purple Top)SARS-COV-2 Vaccination 05/23/2020   PNEUMOCOCCAL CONJUGATE-20 12/05/2021    {TDAP status:2101805}  {Flu Vaccine status:2101806}  {Pneumococcal vaccine status:2101807}  {Covid-19 vaccine status:2101808}  Qualifies for Shingles Vaccine? {YES/NO:21197}  Zostavax completed {YES/NO:21197}  {Shingrix Completed?:2101804}  Screening Tests Health Maintenance  Topic Date Due   Hepatitis C Screening  Never done   DTaP/Tdap/Td (1 - Tdap) Never done   Zoster Vaccines- Shingrix (1 of 2) Never done   COLONOSCOPY (Pts 45-38yrs Insurance coverage will need to be confirmed)  Never  done   COVID-19 Vaccine (5 - 2023-24 season) 04/06/2022   OPHTHALMOLOGY EXAM  11/15/2022   FOOT EXAM  12/06/2022   HEMOGLOBIN A1C  03/06/2023   INFLUENZA VACCINE  03/07/2023   Lung Cancer Screening  05/12/2023   Diabetic kidney evaluation - Urine ACR  09/06/2023   Diabetic  kidney evaluation - eGFR measurement  09/15/2023   Medicare Annual Wellness (AWV)  11/14/2023   Pneumonia Vaccine 5865+ Years old  Completed   HPV VACCINES  Aged Out    Health Maintenance  Health Maintenance Due  Topic Date Due   Hepatitis C Screening  Never done   DTaP/Tdap/Td (1 - Tdap) Never done   Zoster Vaccines- Shingrix (1 of 2) Never done   COLONOSCOPY (Pts 45-4044yrs Insurance coverage will need to be confirmed)  Never done   COVID-19 Vaccine (5 - 2023-24 season) 04/06/2022    {Colorectal cancer screening:2101809}  Lung Cancer Screening: (Low Dose CT Chest recommended if Age 80-80 years, 30 pack-year currently smoking OR have quit w/in 15years.) {DOES NOT does:27190::"does not"} qualify.   Lung Cancer Screening Referral: ***  Additional Screening:  Hepatitis C Screening: {DOES NOT does:27190::"does not"} qualify; Completed ***  Vision Screening: Recommended annual ophthalmology exams for early detection of glaucoma and other disorders of the eye. Is the patient up to date with their annual eye exam?  YES  Who is the provider or what is the name of the office in which the patient attends annual eye exams? [facility]  If pt is not established with a provider, would they like to be referred to a provider to establish care? No .   Dental Screening: Recommended annual dental exams for proper oral hygiene  Community Resource Referral / Chronic Care Management: CRR required this visit?  No   CCM required this visit?  No      Plan:     I have personally reviewed and noted the following in the patient's chart:   Medical and social history Use of alcohol, tobacco or illicit drugs  Current  medications and supplements including opioid prescriptions. Patient is not currently taking opioid prescriptions. Functional ability and status Nutritional status Physical activity Advanced directives List of other physicians Hospitalizations, surgeries, and ER visits in previous 12 months Vitals Screenings to include cognitive, depression, and falls Referrals and appointments  In addition, I have reviewed and discussed with patient certain preventive protocols, quality metrics, and best practice recommendations. A written personalized care plan for preventive services as well as general preventive health recommendations were provided to patient.     Paul Lollingannmaria  Cristin Bullock, CMA   11/14/2022   Nurse Notes: IN PERSON   Mr. Langston MaskerMorris , Thank you for taking time to come for your Medicare Wellness Visit. I appreciate your ongoing commitment to your health goals. Please review the following plan we discussed and let me know if I can assist you in the future.   These are the goals we discussed:  Goals   None     This is a list of the screening recommended for you and due dates:  Health Maintenance  Topic Date Due   Hepatitis C Screening: USPSTF Recommendation to screen - Ages 3918-79 yo.  Never done   DTaP/Tdap/Td vaccine (1 - Tdap) Never done   Zoster (Shingles) Vaccine (1 of 2) Never done   Colon Cancer Screening  Never done   COVID-19 Vaccine (5 - 2023-24 season) 04/06/2022   Eye exam for diabetics  11/15/2022   Complete foot exam   12/06/2022   Hemoglobin A1C  03/06/2023   Flu Shot  03/07/2023   Screening for Lung Cancer  05/12/2023   Yearly kidney health urinalysis for diabetes  09/06/2023   Yearly kidney function blood test for diabetes  09/15/2023   Medicare Annual Wellness Visit  11/14/2023  Pneumonia Vaccine  Completed   HPV Vaccine  Aged Out

## 2022-11-20 NOTE — Addendum Note (Signed)
Addended by: Derrek Monaco on: 11/20/2022 08:09 PM   Modules accepted: Orders

## 2022-11-21 ENCOUNTER — Ambulatory Visit (INDEPENDENT_AMBULATORY_CARE_PROVIDER_SITE_OTHER): Payer: Medicare Other

## 2022-11-21 DIAGNOSIS — I255 Ischemic cardiomyopathy: Secondary | ICD-10-CM

## 2022-11-21 LAB — CUP PACEART REMOTE DEVICE CHECK
Battery Remaining Longevity: 25 mo
Battery Voltage: 2.93 V
Brady Statistic AP VP Percent: 0.01 %
Brady Statistic AP VS Percent: 16.33 %
Brady Statistic AS VP Percent: 0.05 %
Brady Statistic AS VS Percent: 83.61 %
Brady Statistic RA Percent Paced: 15.96 %
Brady Statistic RV Percent Paced: 0.06 %
Date Time Interrogation Session: 20240417082725
HighPow Impedance: 86 Ohm
Implantable Lead Connection Status: 753985
Implantable Lead Connection Status: 753985
Implantable Lead Implant Date: 20160412
Implantable Lead Implant Date: 20160412
Implantable Lead Location: 753859
Implantable Lead Location: 753860
Implantable Lead Model: 5076
Implantable Lead Model: 6935
Implantable Pulse Generator Implant Date: 20160412
Lead Channel Impedance Value: 304 Ohm
Lead Channel Impedance Value: 361 Ohm
Lead Channel Impedance Value: 456 Ohm
Lead Channel Pacing Threshold Amplitude: 0.625 V
Lead Channel Pacing Threshold Amplitude: 2.125 V
Lead Channel Pacing Threshold Pulse Width: 0.4 ms
Lead Channel Pacing Threshold Pulse Width: 0.4 ms
Lead Channel Sensing Intrinsic Amplitude: 6 mV
Lead Channel Sensing Intrinsic Amplitude: 6 mV
Lead Channel Sensing Intrinsic Amplitude: 7.25 mV
Lead Channel Sensing Intrinsic Amplitude: 7.25 mV
Lead Channel Setting Pacing Amplitude: 1.5 V
Lead Channel Setting Pacing Amplitude: 2.5 V
Lead Channel Setting Pacing Pulse Width: 1 ms
Lead Channel Setting Sensing Sensitivity: 0.3 mV
Zone Setting Status: 755011

## 2022-11-22 ENCOUNTER — Telehealth: Payer: Self-pay

## 2022-11-22 ENCOUNTER — Other Ambulatory Visit: Payer: Self-pay

## 2022-11-22 DIAGNOSIS — K635 Polyp of colon: Secondary | ICD-10-CM

## 2022-11-22 MED ORDER — GOLYTELY 236 G PO SOLR: 4000.0000 mL | Freq: Once | ORAL | 0 refills | Status: AC

## 2022-11-22 NOTE — Telephone Encounter (Signed)
Gastroenterology Pre-Procedure Review  Request Date: 12/11/22 Requesting Physician: Dr. Allegra Lai  PATIENT REVIEW QUESTIONS: The patient responded to the following health history questions as indicated:    1. Are you having any GI issues? no 2. Do you have a personal history of Polyps? yes ( It was done in [facility] 2019?) 3. Do you have a family history of Colon Cancer or Polyps? no 4. 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 Joint replacements in the past 12 months?no 6. Major health problems in the past 3 months?no 7. Any artificial heart valves, MVP, or defibrillator?no    MEDICATIONS & ALLERGIES:    Patient reports the following regarding taking any anticoagulation/antiplatelet therapy:   Plavix, Coumadin, Eliquis, Xarelto, Lovenox, Pradaxa, Brilinta, or Effient? yes (Patient takes Eliquis and Plavix blood thinner request sent to [facility] in cardiology) Aspirin? no  Patient confirms/reports the following medications:  Current Outpatient Medications  Medication Sig Dispense Refill   albuterol (PROVENTIL) (2.5 MG/3ML) 0.083% nebulizer solution Take 3 mLs (2.5 mg total) by nebulization every 6 (six) hours as needed for wheezing or shortness of breath. 540 mL 3   apixaban (ELIQUIS) 2.5 MG TABS tablet Take 1 tablet (2.5 mg total) by mouth 2 (two) times daily. 180 tablet 3   bisoprolol (ZEBETA) 5 MG tablet Take 1.5 tablets (7.5 mg total) by mouth daily. 135 tablet 3   Capsaicin 0.035 % LOTN Apply 1 Film topically 3 (three) times daily as needed (back pain). Apply thin film to lower back three times daily as needed for pain 113 g 3   Cholecalciferol (VITAMIN D-3) 125 MCG (5000 UT) TABS Take 5,000 Units by mouth daily.     clopidogrel (PLAVIX) 75 MG tablet TAKE 1 TABLET DAILY 90 tablet 3   fluticasone (FLONASE) 50 MCG/ACT nasal spray Place 2 sprays into both nostrils daily. As  needed     fluticasone furoate-vilanterol (BREO ELLIPTA)  100-25 MCG/ACT AEPB Inhale into the lungs.     JARDIANCE 10 MG TABS tablet TAKE 1 TABLET DAILY 90 tablet 3   Multiple Vitamins-Minerals (MULTIVITAMIN WITH MINERALS) tablet Take 1 tablet by mouth daily.     nitroGLYCERIN (NITROSTAT) 0.4 MG SL tablet Place 1 tablet (0.4 mg total) under the tongue every 5 (five) minutes as needed for chest pain. 25 tablet 3   nystatin (MYCOSTATIN) 100000 UNIT/ML suspension Take 2 mLs (200,000 Units total) by mouth 4 (four) times daily. 473 mL 0   nystatin powder Apply 1 Application topically 3 (three) times daily. 15 g 0   Pitavastatin Calcium (LIVALO) 4 MG TABS Take 1 tablet (4 mg total) by mouth daily. 90 tablet 3   sacubitril-valsartan (ENTRESTO) 49-51 MG Take 1 tablet by mouth 2 (two) times daily. 180 tablet 3   sildenafil (VIAGRA) 50 MG tablet Take 1 tablet (50 mg total) by mouth daily as needed for erectile dysfunction. 30 tablet 0   spironolactone (ALDACTONE) 25 MG tablet TAKE 1 TABLET DAILY 90 tablet 3   torsemide (DEMADEX) 20 MG tablet Take 0.5 tablets (10 mg total) by mouth every other day. Take an extra half tablet PRN for weight gain/edema. (3lbs in 1 day or 5 lbs in 1 week) 15 tablet 3   No current facility-administered medications for this visit.    Patient confirms/reports the following allergies:  Allergies  Allergen Reactions   Atorvastatin Other (See Comments)    Myalgias   Rosuvastatin Other (See Comments)  myalgias    No orders of the defined types were placed in this encounter.   AUTHORIZATION INFORMATION Primary Insurance: 1D#: Group #:  Secondary Insurance: 1D#: Group #:  SCHEDULE INFORMATION: Date: 12/11/22 Time: Location: [facility]

## 2022-11-23 NOTE — Progress Notes (Unsigned)
Cardiology Office Note Date:  11/26/2022  Patient ID:  Paul Bullock, Paul Bullock Dec 06, 1945, MRN [id number] PCP:  Mercie Eon, MD  Cardiologist:  Lorine Bears, MD' HF Cardiologist: Marca Ancona, MD Electrophysiologist: Sherryl Manges, MD    Chief Complaint: 1 year ICD follow-up  History of Present Illness: [name] is a 77 y.o. male with PMH notable for CAD, ICM, HFrEF s/p ICD, HTN, COPD; seen today for Sherryl Manges, MD for routine electrophysiology followup.  Last saw Dr. Graciela Husbands 11/2021 to establish ICD care after moving to [facility]. Has seen HF cardiology team regularly since then.   Today, he tells me that he is doing very well.  He does not check BP at home, but has frequent MD appointments and readings are always 90-110s. Diligent about taking medications, uses alarm 8a and 8p to remember.   No bleeding concerns    he denies chest pain, palpitations, dyspnea, PND, orthopnea, nausea, vomiting, dizziness, syncope, edema, weight gain, or early satiety.     Device Information: MDT dual chamber ICD, imp 11/2014 (in [place]), dx ICD  Past Medical History:  Diagnosis Date   AICD (automatic cardioverter/defibrillator) present    a. 11/2014 s/p MDT DDMB 1D4 [facility] MRI XT DR AICD (ser# [id number]).   Bladder cancer    a. 2013 s/p chemo/xrt.   CAD (coronary artery disease)    a. 11/2020 s/p MI/cardiac arrest-->DES to LCX x 1; b. 10/2011 Cath: LAD 18m w/ aneurysmal post-stenotic dilation-->med rx. LCX 50d to prior stent; c. 10/2021 NSTEMI/Cath: LM 30ost/m, LAD 90p/m, 72m, LCX 20p/m, patent stent, LPAV 60, RCA mild diff dzs, EF<25%, LVEDP .   Chronic HFrEF (heart failure with reduced ejection fraction)    a. EF prev as low as 20% w/ subsequent improvement to 50-55%; b. 11/2020 limited echo: "windows were challenging...LVEF largely preserved."; c. 10/2021 LV gram: EF <25%, LVEDP .   COPD (chronic obstructive pulmonary disease)    Depression    Diverticulitis    DVT (deep venous thrombosis)     Essential hypertension    Hyperlipidemia LDL goal <70    Ischemic cardiomyopathy    a. EF prev as low as 20%; b. 11/2014 s/p MDT [id number] [facility] MRI XT DR AICD; c. subsequent improvement in EF to 50-55%; d. 11/2020 limited echo: "windows were challenging...LVEF largely preserved."; e. 10/2021 LV Gram: EF <25%.   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   Thrombocytopenia    Tobacco abuse    Varicose veins of left lower extremity     Past Surgical History:  Procedure Laterality Date   CARDIAC DEFIBRILLATOR PLACEMENT     CORONARY STENT INTERVENTION N/A 10/18/2021   Procedure: CORONARY STENT INTERVENTION;  Surgeon: Iran Ouch, MD;  Location: [facility] INVASIVE CV [facility];  Service: Cardiovascular;  Laterality: N/A;  Prox and Mid LAD   MOHS SURGERY     PERCUTANEOUS CORONARY STENT INTERVENTION (PCI-S)     RIGHT/LEFT HEART CATH AND CORONARY ANGIOGRAPHY N/A 10/13/2021   Procedure: RIGHT/LEFT HEART CATH AND CORONARY ANGIOGRAPHY;  Surgeon: Iran Ouch, MD;  Location: [facility] INVASIVE CV LAB;  Service: Cardiovascular;  Laterality: N/A;   VASECTOMY     VASECTOMY REVERSAL      Current Outpatient Medications  Medication Instructions   albuterol (PROVENTIL) 2.5 mg, Nebulization, Every 6 hours PRN   apixaban (ELIQUIS) 2.5 mg, Oral, 2 times daily   bisoprolol (ZEBETA) 7.5 mg,  Oral, Daily   clopidogrel (PLAVIX) 75 mg, Oral, Daily   fluticasone (FLONASE) 50 MCG/ACT nasal spray 2 sprays, Each Nare, Daily, As  needed   fluticasone furoate-vilanterol (BREO ELLIPTA) 100-25 MCG/ACT AEPB Inhalation   Jardiance 10 mg, Oral, Daily   Livalo 4 mg, Oral, Daily   Multiple Vitamins-Minerals (MULTIVITAMIN WITH MINERALS) tablet 1 tablet, Oral, Daily   nitroGLYCERIN (NITROSTAT) 0.4 mg, Sublingual, Every 5 min PRN   nystatin (MYCOSTATIN) 200,000 Units, Oral, 4 times daily   nystatin powder 1 Application, Topical, 3 times daily    sacubitril-valsartan (ENTRESTO) 49-51 MG 1 tablet, Oral, 2 times daily   sildenafil (VIAGRA) 50 mg, Oral, Daily PRN   spironolactone (ALDACTONE) 25 mg, Oral, Daily   torsemide (DEMADEX) 10 mg, Oral, Every other day, Take an extra half tablet PRN for weight gain/edema. (3lbs in 1 day or 5 lbs in 1 week)   Vitamin D-3 5,000 Units, Oral, Daily    Social History:  The patient  reports that he has been smoking cigarettes. He has a 41.25 pack-year smoking history. He has never used smokeless tobacco. He reports that he does not currently use alcohol. He reports that he does not use drugs.   Family History: The patient's family history includes Bladder Cancer in his father; Heart attack in his brother; Stroke in his mother.  ROS:  Please see the history of present illness. All other systems are reviewed and otherwise negative.   PHYSICAL EXAM:  VS:  BP (!) 90/56 (BP Location: Left Arm, Patient Position: Sitting, Cuff Size: Large)   Pulse 77   Ht  (1.778 m)   Wt 258 lb (117 kg)   SpO2 96%   BMI 37.02 kg/m  BMI: Body mass index is 37.02 kg/m.  GEN- The patient is well appearing, alert and oriented x 3 today.   Lungs- Clear to ausculation bilaterally, normal work of breathing.  Heart- Regular rate and rhythm, no murmurs, rubs or gallops, JVD at clavicle Extremities- 1-2+ peripheral edema, warm, dry Skin-   device pocket well-healed   Device interrogation done today and reviewed by myself:  Battery good Lead thresholds, impedence, sensing stable  Brief NSVT episodes, longest 10 seconds  changes made today  EKG is ordered. Personal review of EKG from today shows:  NSR, rate 77bpm; LAD  Recent Labs: 12/28/2021: ALT 19 02/26/2022: B Natriuretic Peptide 175.8 09/05/2022: TSH 1.760 09/14/2022: BUN 15; Creatinine, Ser 1.27; Hemoglobin 18.1; Platelets 111; Potassium 4.6; Sodium 138  12/08/2021: Cholesterol 112; HDL 36; LDL Cholesterol 56; Total CHOL/HDL Ratio 3.1; Triglycerides 100; VLDL 20    CrCl cannot be calculated (Patient's most recent lab result is older than the maximum 21 days allowed.).   Wt Readings from Last 3 Encounters:  11/26/22 258 lb (117 kg)  11/14/22 261 lb 1.6 oz (118.4 kg)  11/14/22 261 lb 11.2 oz (118.7 kg)     Additional studies reviewed include: Previous EP, cardiology notes.   TTE, 05/28/2022  1. Left ventricular ejection fraction, by estimation, is 30 to 35%. The left ventricle has moderate to severely decreased function. The left ventricle demonstrates global hypokinesis. There is mild left ventricular hypertrophy. Left ventricular diastolic parameters are consistent with Grade II diastolic dysfunction (pseudonormalization).   2. Right ventricular systolic function is low normal. The right ventricular size is normal.   3. The mitral valve is normal in structure. No evidence of mitral valve  regurgitation.   4. The aortic valve was not well visualized. Aortic valve regurgitation is  mild.   5. The inferior vena cava is normal in size with greater than 50% respiratory variability, suggesting right atrial pressure of 3 mmHg.   Comparison(s): Echocardiogram done 10/13/21 showed an EF of 20-25%.    ASSESSMENT AND PLAN:  #) s/p medtronic ICD in situ Device functioning well, see paceart for details Chronically elevated ventricular output, unchanged  #) NSVT By device, patient asymptomatic Very brief episodes Cont to monitor  #) HFrEF Warm and dry on exam Some lower extremity edema, but patient states this is stable NYHA class III GDMT: bisoprolol, jardiance, entresto, spiro Diuretic: torsemide 10mg  every other day   #) ICM #) CAD s/p PCI No ischemic s/s Cont statin   Current medicines are reviewed at length with the patient today.   The patient does not have concerns regarding his medicines.  The following changes were made today:  none  Labs/ tests ordered today include:  Orders Placed This Encounter  Procedures   EKG 12-Lead      Disposition: Follow up with Dr. Graciela Husbands or EP APP in 12 months   Signed, Sherie Don, NP  11/26/22  3:56 PM  Electrophysiology [facility]

## 2022-11-26 ENCOUNTER — Ambulatory Visit: Payer: Medicare Other | Attending: Cardiology | Admitting: Cardiology

## 2022-11-26 ENCOUNTER — Encounter: Payer: Self-pay | Admitting: Cardiology

## 2022-11-26 VITALS — BP 90/56 | HR 77 | Ht 70.0 in | Wt 258.0 lb

## 2022-11-26 DIAGNOSIS — Z95810 Presence of automatic (implantable) cardiac defibrillator: Secondary | ICD-10-CM

## 2022-11-26 DIAGNOSIS — I5022 Chronic systolic (congestive) heart failure: Secondary | ICD-10-CM

## 2022-11-26 DIAGNOSIS — I4729 Other ventricular tachycardia: Secondary | ICD-10-CM | POA: Diagnosis not present

## 2022-11-26 DIAGNOSIS — I255 Ischemic cardiomyopathy: Secondary | ICD-10-CM | POA: Diagnosis present

## 2022-11-26 LAB — CUP PACEART INCLINIC DEVICE CHECK
Date Time Interrogation Session: 20240422161341
Implantable Lead Connection Status: 753985
Implantable Lead Connection Status: 753985
Implantable Lead Implant Date: 20160412
Implantable Lead Implant Date: 20160412
Implantable Lead Location: 753859
Implantable Lead Location: 753860
Implantable Lead Model: 5076
Implantable Lead Model: 6935
Implantable Pulse Generator Implant Date: 20160412

## 2022-11-26 NOTE — Patient Instructions (Signed)
Medication Instructions:  Your physician recommends that you continue on your current medications as directed. Please refer to the Current Medication list given to you today.  *If you need a refill on your cardiac medications before your next appointment, please call your pharmacy*  Lab Work: No labs ordered  If you have labs (blood work) drawn today and your tests are completely normal, you will receive your results only by: MyChart Message (if you have MyChart) OR A paper copy in the mail If you have any lab test that is abnormal or we need to change your treatment, we will call you to review the results.   Testing/Procedures: No testing ordered  Follow-Up: At [facility], you and your health needs are our priority.  As part of our continuing mission to provide you with exceptional heart care, we have created designated Provider Care Teams.  These Care Teams include your primary Cardiologist (physician) and Advanced Practice Providers (APPs -  Physician Assistants and Nurse Practitioners) who all work together to provide you with the care you need, when you need it.  We recommend signing up for the patient portal called "MyChart".  Sign up information is provided on this After Visit Summary.  MyChart is used to connect with patients for Virtual Visits (Telemedicine).  Patients are able to view lab/test results, encounter notes, upcoming appointments, etc.  Non-urgent messages can be sent to your provider as well.   To learn more about what you can do with MyChart, go to [email or web address].    Your next appointment:   1 year(s)  Provider:   Sherryl Manges, MD or Sherie Don, NP

## 2022-11-26 NOTE — Telephone Encounter (Signed)
Phone call made to ensure that clearance and blood thinner was sent to correct provider NP Prince Rome at [facility].  Front office informed me that it was sent to the right office as he is a patient of [employer].  She said she will put a note in for me.  Informed her that his procedure is on 12/11/22.  Thanks, [place], [place]

## 2022-12-05 ENCOUNTER — Telehealth: Payer: Self-pay

## 2022-12-05 ENCOUNTER — Telehealth (HOSPITAL_COMMUNITY): Payer: Self-pay

## 2022-12-05 NOTE — Telephone Encounter (Signed)
Surgical clearance form faxed to [facility] on 12/05/22 [phone]

## 2022-12-05 NOTE — Telephone Encounter (Signed)
Patient left a voicemail and wants to make ure that his colonoscopy is cancel for 12/11/2022

## 2022-12-05 NOTE — Telephone Encounter (Signed)
Returned patients call.  Informed him that his colonoscopy was canceled for 12/11/22.  Thanks, [place], [place]

## 2022-12-11 ENCOUNTER — Encounter: Admission: RE | Payer: Self-pay | Source: Home / Self Care

## 2022-12-11 ENCOUNTER — Ambulatory Visit: Admission: RE | Admit: 2022-12-11 | Payer: Medicare Other | Source: Home / Self Care | Admitting: Gastroenterology

## 2022-12-11 SURGERY — COLONOSCOPY WITH PROPOFOL
Anesthesia: General

## 2022-12-24 NOTE — Progress Notes (Signed)
Remote ICD transmission.   

## 2022-12-26 ENCOUNTER — Ambulatory Visit: Payer: Medicare Other | Admitting: Physical Therapy

## 2023-01-02 ENCOUNTER — Encounter: Payer: Medicare Other | Admitting: Physical Therapy

## 2023-01-07 ENCOUNTER — Encounter: Payer: Medicare Other | Admitting: Physical Therapy

## 2023-01-09 ENCOUNTER — Encounter: Payer: Medicare Other | Admitting: Physical Therapy

## 2023-01-14 ENCOUNTER — Encounter: Payer: Medicare Other | Admitting: Physical Therapy

## 2023-01-14 ENCOUNTER — Ambulatory Visit: Payer: Medicare Other | Admitting: Physical Therapy

## 2023-01-16 ENCOUNTER — Encounter: Payer: Medicare Other | Admitting: Physical Therapy

## 2023-01-19 ENCOUNTER — Other Ambulatory Visit (HOSPITAL_COMMUNITY): Payer: Self-pay | Admitting: Cardiology

## 2023-01-20 ENCOUNTER — Other Ambulatory Visit (HOSPITAL_COMMUNITY): Payer: Self-pay | Admitting: Cardiology

## 2023-01-20 DIAGNOSIS — I5022 Chronic systolic (congestive) heart failure: Secondary | ICD-10-CM

## 2023-01-21 ENCOUNTER — Other Ambulatory Visit: Payer: Self-pay

## 2023-01-21 ENCOUNTER — Encounter: Payer: Medicare Other | Admitting: Physical Therapy

## 2023-01-21 DIAGNOSIS — J449 Chronic obstructive pulmonary disease, unspecified: Secondary | ICD-10-CM

## 2023-01-21 MED ORDER — FLUTICASONE FUROATE-VILANTEROL 100-25 MCG/ACT IN AEPB
1.0000 | INHALATION_SPRAY | Freq: Every day | RESPIRATORY_TRACT | 2 refills | Status: DC
Start: 2023-01-21 — End: 2023-01-28

## 2023-01-24 ENCOUNTER — Encounter: Payer: Medicare Other | Admitting: Physical Therapy

## 2023-01-28 ENCOUNTER — Encounter: Payer: Medicare Other | Admitting: Physical Therapy

## 2023-01-28 MED ORDER — FLUTICASONE FUROATE-VILANTEROL 100-25 MCG/ACT IN AEPB
1.0000 | INHALATION_SPRAY | Freq: Every day | RESPIRATORY_TRACT | 2 refills | Status: DC
Start: 2023-01-28 — End: 2023-04-22

## 2023-01-28 NOTE — Addendum Note (Signed)
Addended by: Derrek Monaco on: 01/28/2023 02:55 PM   Modules accepted: Orders

## 2023-01-28 NOTE — Telephone Encounter (Signed)
Hey Dr.Machen looks like you sent rx to an alternative pharmacy, please send rx to pharmacy attached to the note.  [facility] - [place], [place] - [address] (Pharmacy) contacted Me (Fax)

## 2023-01-30 ENCOUNTER — Ambulatory Visit (HOSPITAL_COMMUNITY)
Admission: RE | Admit: 2023-01-30 | Discharge: 2023-01-30 | Disposition: A | Discharge status: Home / Self Care | Payer: Medicare Other | Source: Ambulatory Visit | Attending: Cardiology | Admitting: Cardiology

## 2023-01-30 ENCOUNTER — Encounter (HOSPITAL_COMMUNITY): Payer: Self-pay | Admitting: Cardiology

## 2023-01-30 VITALS — BP 102/60 | HR 75 | Wt 263.2 lb

## 2023-01-30 DIAGNOSIS — Z8249 Family history of ischemic heart disease and other diseases of the circulatory system: Secondary | ICD-10-CM | POA: Insufficient documentation

## 2023-01-30 DIAGNOSIS — Z7902 Long term (current) use of antithrombotics/antiplatelets: Secondary | ICD-10-CM | POA: Diagnosis not present

## 2023-01-30 DIAGNOSIS — Z7901 Long term (current) use of anticoagulants: Secondary | ICD-10-CM | POA: Insufficient documentation

## 2023-01-30 DIAGNOSIS — D751 Secondary polycythemia: Secondary | ICD-10-CM | POA: Insufficient documentation

## 2023-01-30 DIAGNOSIS — Z955 Presence of coronary angioplasty implant and graft: Secondary | ICD-10-CM | POA: Insufficient documentation

## 2023-01-30 DIAGNOSIS — Z86711 Personal history of pulmonary embolism: Secondary | ICD-10-CM | POA: Diagnosis not present

## 2023-01-30 DIAGNOSIS — Z79899 Other long term (current) drug therapy: Secondary | ICD-10-CM | POA: Diagnosis not present

## 2023-01-30 DIAGNOSIS — I2510 Atherosclerotic heart disease of native coronary artery without angina pectoris: Secondary | ICD-10-CM | POA: Insufficient documentation

## 2023-01-30 DIAGNOSIS — I255 Ischemic cardiomyopathy: Secondary | ICD-10-CM | POA: Insufficient documentation

## 2023-01-30 DIAGNOSIS — Z86718 Personal history of other venous thrombosis and embolism: Secondary | ICD-10-CM | POA: Diagnosis not present

## 2023-01-30 DIAGNOSIS — I5022 Chronic systolic (congestive) heart failure: Secondary | ICD-10-CM | POA: Diagnosis not present

## 2023-01-30 DIAGNOSIS — C679 Malignant neoplasm of bladder, unspecified: Secondary | ICD-10-CM | POA: Insufficient documentation

## 2023-01-30 DIAGNOSIS — Z7984 Long term (current) use of oral hypoglycemic drugs: Secondary | ICD-10-CM | POA: Insufficient documentation

## 2023-01-30 DIAGNOSIS — Z87891 Personal history of nicotine dependence: Secondary | ICD-10-CM | POA: Insufficient documentation

## 2023-01-30 DIAGNOSIS — D696 Thrombocytopenia, unspecified: Secondary | ICD-10-CM | POA: Diagnosis not present

## 2023-01-30 DIAGNOSIS — J449 Chronic obstructive pulmonary disease, unspecified: Secondary | ICD-10-CM | POA: Insufficient documentation

## 2023-01-30 DIAGNOSIS — I252 Old myocardial infarction: Secondary | ICD-10-CM | POA: Insufficient documentation

## 2023-01-30 LAB — LIPID PANEL
Cholesterol: 134 mg/dL (ref 0–200)
HDL: 44 mg/dL (ref >40)
LDL Cholesterol: 81 mg/dL (ref 0–99)
Total CHOL/HDL Ratio: 3 RATIO
Triglycerides: 43 mg/dL (ref <150)
VLDL: 9 mg/dL (ref 0–40)

## 2023-01-30 LAB — HEPATIC FUNCTION PANEL
ALT: 22 U/L (ref 0–44)
AST: 18 U/L (ref 15–41)
Albumin: 3.7 g/dL (ref 3.5–5.0)
Alkaline Phosphatase: 55 U/L (ref 38–126)
Bilirubin, Direct: 0.3 mg/dL — ABNORMAL HIGH (ref 0.0–0.2)
Indirect Bilirubin: 1.5 mg/dL — ABNORMAL HIGH (ref 0.3–0.9)
Total Bilirubin: 1.8 mg/dL — ABNORMAL HIGH (ref 0.3–1.2)
Total Protein: 6.6 g/dL (ref 6.5–8.1)

## 2023-01-30 LAB — BASIC METABOLIC PANEL
Anion gap: 10 (ref 5–15)
BUN: 16 mg/dL (ref 8–23)
CO2: 23 mmol/L (ref 22–32)
Calcium: 9.2 mg/dL (ref 8.9–10.3)
Chloride: 107 mmol/L (ref 98–111)
Creatinine, Ser: 1.4 mg/dL — ABNORMAL HIGH (ref 0.61–1.24)
GFR, Estimated: 53 mL/min — ABNORMAL LOW (ref >60)
Glucose, Bld: 122 mg/dL — ABNORMAL HIGH (ref 70–99)
Potassium: 4.6 mmol/L (ref 3.5–5.1)
Sodium: 140 mmol/L (ref 135–145)

## 2023-01-30 LAB — BRAIN NATRIURETIC PEPTIDE: B Natriuretic Peptide: 107.7 pg/mL — ABNORMAL HIGH (ref 0.0–100.0)

## 2023-01-30 MED ORDER — BISOPROLOL FUMARATE 10 MG PO TABS
10.0000 mg | ORAL_TABLET | Freq: Every day | ORAL | 3 refills | Status: DC
Start: 2023-01-30 — End: 2023-12-24

## 2023-01-30 MED ORDER — APIXABAN 5 MG PO TABS: 5.0000 mg | ORAL_TABLET | Freq: Two times a day (BID) | ORAL | 11 refills | Status: DC

## 2023-01-30 MED ORDER — VARENICLINE TARTRATE (STARTER) 0.5 MG X 11 & 1 MG X 42 PO TBPK: ORAL_TABLET | ORAL | 0 refills | Status: DC

## 2023-01-30 NOTE — Patient Instructions (Signed)
START Chantix as directed on box.  INCREASE Bisoprolol to 10 mg daily.  INCREASE Eliquis to 5 mg Twice daily  STOP Plavix.  INCREASE Torsemide to 10 mg daily for 5 days, then go back to 10 mg every other day.  Labs done today, your results will be available in MyChart, we will contact you for abnormal readings.  Your physician recommends that you schedule a follow-up appointment in: 3 months at the [facility] office  If you have any questions or concerns before your next appointment please send [place] a message through [place] or call our office at [phone].    TO LEAVE A MESSAGE FOR THE NURSE SELECT OPTION 2, PLEASE LEAVE A MESSAGE INCLUDING: YOUR NAME DATE OF BIRTH CALL BACK NUMBER REASON FOR CALL**this is important as we prioritize the call backs  YOU WILL RECEIVE A CALL BACK THE SAME DAY AS LONG AS YOU CALL BEFORE 4:00 PM  At the [facility], you and your health needs are our priority. As part of our continuing mission to provide you with exceptional heart care, we have created designated Provider Care Teams. These Care Teams include your primary Cardiologist (physician) and Advanced Practice Providers (APPs- Physician Assistants and Nurse Practitioners) who all work together to provide you with the care you need, when you need it.   You may see any of the following providers on your designated Care Team at your next follow up: Dr Arvilla Meres Dr Marca Ancona Dr. Marcos Eke, NP Robbie Lis, [place] [facility] [place], [place] Brynda Peon, NP Karle Plumber, PharmD   Please be sure to bring in all your medications bottles to every appointment.    Thank you for choosing [facility]

## 2023-01-30 NOTE — Progress Notes (Signed)
PCP: Mercie Eon, MD Cardiology: Dr. Shirlee Latch  77 y.o. with history of COPD, DVT/PE, ischemic cardiomyopathy, and CAD presents for followup of CHF.  Patient suffered a myocardial infarction complicated by cardiac arrest in 2002 and required stenting of the obtuse marginal.  He notes that he subsequently had LV dysfunction.  In 2013, he required repeat diagnostic catheterization which revealed moderate disease distal to previously placed obtuse marginal stent with a 75% stenosis in the mid LAD with aneurysmal post stenotic dilation.  Per notes, he was medically managed.  In the setting of LV dysfunction with an EF as low as 20%, he underwent Medtronic AICD placement in April 2016, and up until 2022, he was followed closely by cardiology in [place], [place].  He did require admission to a hospital in [facility] in June 2002, in the setting of COPD exacerbation while visiting his son.  Per discharge summary, he was noted to have mild troponin elevation which was felt to be secondary to demand ischemia.   Mr. Cordoba moved to [place] in December 2022 and has been living locally.  His ex-wife lives nearby and they are close friends.  On October 12 2021, he developed recurrent chest discomfort and was admitted to [facility] with NSTEMI.  He underwent diagnostic catheterization at [facility] regional revealing severe proximal to mid LAD disease with otherwise nonobstructive disease and patent obtuse marginal stent.  His LVEDP was approximately 40 mmHg and ventriculogram showed an EF of less than 25%.  He was given Lasix in the lab and after discussion with advanced heart failure team at [facility], he was transferred for further evaluation and potential inotropic support.  At [facility], he was diuresed and started on GDMT.  He had DES x 2 to proximal and mid LAD and was discharged home.   Patient is still smoking 3/4 ppd.  Weight up about 5 lbs.  Just got back from a week in [place] for his son's wedding. No  chest pain. No dyspnea walking on flat ground.  Has a hard time tolerating the heat.  No dyspnea walking up stairs.    ECG (personally reviewed): NSR, IVCD 126 msec, inferior Qs  Medtronic ICD: stable thoracic impedance. No VT/AF.  12% a-pacing  Labs (3/23): K 4.6, creatinine 1.02, LDL 59, hgb 17.7, plts 98K Labs (5/23): K 4.6, creatinine 1.44, LDL 56, JAK2 mutation negative, hgb 17.9, plts 123 Labs (2/24): K 4.6, creatinine 1.27  PMH: 1. COPD: Quit smoking in 3/23.  2. DVT/PE 3. Chronic systolic CHF: Ischemic cardiomyopathy.  Medtronic ICD from 4/16.  - Echo (3/23): EF 20-25% - RHC (3/23): mean RA 25, PA 62/47, mean PCWP 39, CI 2.03.  4. CAD: MI with OM2 stent in 2002.  - NSTEMI 3/23 with DES x 2 to proximal and mid LAD.  5. 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  6. Bladder cancer 2012: Chemoradiation.  7. Type 2 DM 8. Polycythemia: Likely due to chronic smoking, JAK2 mutation negative.   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  Quit smoking in 6/23.  No ETOH.   Family History  Problem Relation Age of Onset   Stroke Mother    Bladder Cancer Father    Heart attack Brother    ROS: All systems reviewed and negative except as per HPI.   Current Outpatient Medications  Medication Sig Dispense Refill   albuterol (PROVENTIL) (2.5 MG/3ML) 0.083% nebulizer solution Take 3 mLs (2.5 mg total) by nebulization every 6 (six) hours as needed  for wheezing or shortness of breath. 540 mL 3   Cholecalciferol (VITAMIN D-3) 25 MCG (1000 UT) CAPS Take 1 capsule by mouth daily.     ENTRESTO 49-51 MG TAKE 1 TABLET TWICE A DAY (CHANGE IN DOSAGE ) 180 tablet 3   fluticasone furoate-vilanterol (BREO ELLIPTA) 100-25 MCG/ACT AEPB Inhale 1 puff into the lungs daily. 60 each 2   JARDIANCE 10 MG TABS tablet TAKE 1 TABLET DAILY 90 tablet 3   Multiple Vitamins-Minerals (MULTIVITAMIN WITH MINERALS) tablet Take 1 tablet by mouth daily.     nitroGLYCERIN (NITROSTAT) 0.4 MG SL  tablet Place 1 tablet (0.4 mg total) under the tongue every 5 (five) minutes as needed for chest pain. 25 tablet 3   nystatin (MYCOSTATIN) 100000 UNIT/ML suspension Take 2 mLs (200,000 Units total) by mouth 4 (four) times daily. 473 mL 0   Pitavastatin Calcium 4 MG TABS TAKE 1 TABLET DAILY 90 tablet 3   sildenafil (VIAGRA) 50 MG tablet Take 1 tablet (50 mg total) by mouth daily as needed for erectile dysfunction. 30 tablet 0   spironolactone (ALDACTONE) 25 MG tablet TAKE 1 TABLET DAILY 90 tablet 3   torsemide (DEMADEX) 20 MG tablet Take 0.5 tablets (10 mg total) by mouth every other day. Take an extra half tablet PRN for weight gain/edema. (3lbs in 1 day or 5 lbs in 1 week) 15 tablet 3   apixaban (ELIQUIS) 5 MG TABS tablet Take 1 tablet (5 mg total) by mouth 2 (two) times daily. 60 tablet 11   bisoprolol (ZEBETA) 10 MG tablet Take 1 tablet (10 mg total) by mouth daily. 90 tablet 3   Varenicline Tartrate, Starter, (CHANTIX STARTING MONTH PAK) 0.5 MG X 11 & 1 MG X 42 TBPK Day 1-3 .5mg  daily, Day 4-7 .5mg  Twice daily, Day 8 onward 1 mg Twice daily 53 each 0   No current facility-administered medications for this encounter.   BP 102/60   Pulse 75   Wt 119.4 kg (263 lb 3.2 oz)   SpO2 94%   BMI 37.77 kg/m  General: NAD Neck: JVP 8 cm, no thyromegaly or thyroid nodule.  Lungs: Clear to auscultation bilaterally with normal respiratory effort. CV: Nondisplaced PMI.  Heart regular S1/S2, no S3/S4, no murmur.  1+ ankle edema.  No carotid bruit.  Normal pedal pulses.  Abdomen: Soft, nontender, no hepatosplenomegaly, no distention.  Skin: Intact without lesions or rashes.  Neurologic: Alert and oriented x 3.  Psych: Normal affect. Extremities: No clubbing or cyanosis.  HEENT: Normal.   Assessment/Plan: 1.  CAD: Patient has history of CAD with prior PCI to OM in 2002.  NSTEMI in 3/23 with cath showing complex proximal LAD disease with serial 90% stenoses separated by small aneurysmal areas.  He had a  60% mid LCx stenosis.  RCA with mild disease.  He had DES x 2 to proximal and mid LAD. No chest pain.  - Stop Plavix at this point and increase apixaban back to 5 mg bid.  - Continue Livalo, check lipids today.   2. Chronic systolic CHF: Medtronic ICD.  Ischemic cardiomyopathy.  Echo in 3/23 with EF 20-25%, severe LV dilation, RV mildly dysfunctional.  RHC in 3/23 with markedly elevated filling pressures, CI 2.03, and PAPI < 1. NYHA class II.  On exam, he looks mildly volume overloaded though Optivol does not suggest significant volume.  - Increase torsemide to 10 mg daily x 5 days then every other day after that. BMET/BNP today.   - Increase  bisoprolol to 10 mg daily (beta-1 selective with COPD).  - Continue Entresto 49/51 bid.    - Continue empagliflozin 10 mg daily.  - Continue spironolactone 25 mg daily.  - QRS does not appear wide enough to benefit from CRT upgrade.   - Set up echo at next appt.  3. 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   - Increase apixaban back to 5 mg bid after stopping Plavix.   4. COPD: He restarted smoking.  - I will provide prescription for Chantix.   5. Thrombocytopenia: Mild, chronic.  6. Bladder cancer: Has been followed by cystoscopies   7. Polycythemia: JAK2 mutation negative, likely due to chronic smoking.   Followup in 3 months at [facility] CHF clinic with me.   Marca Ancona 01/30/2023

## 2023-01-31 ENCOUNTER — Encounter: Payer: Medicare Other | Admitting: Physical Therapy

## 2023-02-01 ENCOUNTER — Other Ambulatory Visit (HOSPITAL_COMMUNITY): Payer: Self-pay | Admitting: Cardiology

## 2023-02-01 MED ORDER — TORSEMIDE 20 MG PO TABS: 10.0000 mg | ORAL_TABLET | ORAL | 3 refills | Status: DC

## 2023-02-01 MED ORDER — APIXABAN 5 MG PO TABS: 5.0000 mg | ORAL_TABLET | Freq: Two times a day (BID) | ORAL | 3 refills | Status: DC

## 2023-02-04 ENCOUNTER — Encounter: Payer: Medicare Other | Admitting: Physical Therapy

## 2023-02-06 ENCOUNTER — Encounter: Payer: Self-pay | Admitting: Cardiology

## 2023-02-06 ENCOUNTER — Other Ambulatory Visit (HOSPITAL_COMMUNITY): Payer: Self-pay

## 2023-02-06 ENCOUNTER — Encounter: Payer: Medicare Other | Admitting: Physical Therapy

## 2023-02-06 DIAGNOSIS — I5022 Chronic systolic (congestive) heart failure: Secondary | ICD-10-CM

## 2023-02-06 MED ORDER — EZETIMIBE 10 MG PO TABS
10.0000 mg | ORAL_TABLET | Freq: Every day | ORAL | 3 refills | Status: DC
Start: 2023-02-06 — End: 2023-12-24

## 2023-02-11 ENCOUNTER — Encounter: Payer: Medicare Other | Admitting: Physical Therapy

## 2023-02-13 ENCOUNTER — Encounter: Payer: Medicare Other | Admitting: Physical Therapy

## 2023-02-18 ENCOUNTER — Encounter: Payer: Medicare Other | Admitting: Physical Therapy

## 2023-02-20 ENCOUNTER — Encounter: Payer: Medicare Other | Admitting: Physical Therapy

## 2023-02-20 ENCOUNTER — Ambulatory Visit (INDEPENDENT_AMBULATORY_CARE_PROVIDER_SITE_OTHER): Payer: Medicare Other

## 2023-02-20 DIAGNOSIS — I5022 Chronic systolic (congestive) heart failure: Secondary | ICD-10-CM

## 2023-02-21 LAB — CUP PACEART REMOTE DEVICE CHECK
Battery Remaining Longevity: 23 mo
Battery Voltage: 2.93 V
Brady Statistic AP VP Percent: 0.02 %
Brady Statistic AP VS Percent: 10.23 %
Brady Statistic AS VP Percent: 0.05 %
Brady Statistic AS VS Percent: 89.7 %
Brady Statistic RA Percent Paced: 10.11 %
Brady Statistic RV Percent Paced: 0.06 %
Date Time Interrogation Session: 20240717031706
HighPow Impedance: 83 Ohm
Implantable Lead Connection Status: 753985
Implantable Lead Connection Status: 753985
Implantable Lead Implant Date: 20160412
Implantable Lead Implant Date: 20160412
Implantable Lead Location: 753859
Implantable Lead Location: 753860
Implantable Lead Model: 5076
Implantable Lead Model: 6935
Implantable Pulse Generator Implant Date: 20160412
Lead Channel Impedance Value: 266 Ohm
Lead Channel Impedance Value: 361 Ohm
Lead Channel Impedance Value: 418 Ohm
Lead Channel Pacing Threshold Amplitude: 0.625 V
Lead Channel Pacing Threshold Amplitude: 1.875 V
Lead Channel Pacing Threshold Pulse Width: 0.4 ms
Lead Channel Pacing Threshold Pulse Width: 0.4 ms
Lead Channel Sensing Intrinsic Amplitude: 4 mV
Lead Channel Sensing Intrinsic Amplitude: 4 mV
Lead Channel Sensing Intrinsic Amplitude: 4.5 mV
Lead Channel Sensing Intrinsic Amplitude: 4.5 mV
Lead Channel Setting Pacing Amplitude: 1.5 V
Lead Channel Setting Pacing Amplitude: 2.5 V
Lead Channel Setting Pacing Pulse Width: 1 ms
Lead Channel Setting Sensing Sensitivity: 0.3 mV
Zone Setting Status: 755011

## 2023-02-25 ENCOUNTER — Encounter: Payer: Medicare Other | Admitting: Physical Therapy

## 2023-02-27 ENCOUNTER — Encounter: Payer: Medicare Other | Admitting: Physical Therapy

## 2023-03-04 ENCOUNTER — Encounter: Payer: Medicare Other | Admitting: Physical Therapy

## 2023-03-06 ENCOUNTER — Encounter: Payer: Medicare Other | Admitting: Physical Therapy

## 2023-03-07 NOTE — Progress Notes (Signed)
Remote ICD transmission.   

## 2023-03-11 ENCOUNTER — Encounter: Payer: Medicare Other | Admitting: Physical Therapy

## 2023-03-13 ENCOUNTER — Encounter: Payer: Medicare Other | Admitting: Physical Therapy

## 2023-04-18 ENCOUNTER — Other Ambulatory Visit
Admission: RE | Admit: 2023-04-18 | Discharge: 2023-04-18 | Disposition: A | Discharge status: Home / Self Care | Payer: Medicare Other | Source: Ambulatory Visit | Attending: Cardiology | Admitting: Cardiology

## 2023-04-18 ENCOUNTER — Encounter: Payer: Medicare Other | Admitting: Cardiology

## 2023-04-18 ENCOUNTER — Ambulatory Visit (HOSPITAL_BASED_OUTPATIENT_CLINIC_OR_DEPARTMENT_OTHER): Payer: Medicare Other | Admitting: Cardiology

## 2023-04-18 VITALS — BP 104/58 | HR 79 | Wt 268.0 lb

## 2023-04-18 DIAGNOSIS — I5022 Chronic systolic (congestive) heart failure: Secondary | ICD-10-CM

## 2023-04-18 LAB — COMPREHENSIVE METABOLIC PANEL
ALT: 27 U/L (ref 0–44)
AST: 20 U/L (ref 15–41)
Albumin: 3.6 g/dL (ref 3.5–5.0)
Alkaline Phosphatase: 55 U/L (ref 38–126)
Anion gap: 10 (ref 5–15)
BUN: 25 mg/dL — ABNORMAL HIGH (ref 8–23)
CO2: 20 mmol/L — ABNORMAL LOW (ref 22–32)
Calcium: 8.9 mg/dL (ref 8.9–10.3)
Chloride: 107 mmol/L (ref 98–111)
Creatinine, Ser: 1.23 mg/dL (ref 0.61–1.24)
GFR, Estimated: >60 mL/min (ref >60)
Glucose, Bld: 94 mg/dL (ref 70–99)
Potassium: 4.5 mmol/L (ref 3.5–5.1)
Sodium: 137 mmol/L (ref 135–145)
Total Bilirubin: 1.5 mg/dL — ABNORMAL HIGH (ref 0.3–1.2)
Total Protein: 6.5 g/dL (ref 6.5–8.1)

## 2023-04-18 LAB — LIPID PANEL
Cholesterol: 106 mg/dL (ref 0–200)
HDL: 38 mg/dL — ABNORMAL LOW (ref >40)
LDL Cholesterol: 32 mg/dL (ref 0–99)
Total CHOL/HDL Ratio: 2.8 ratio
Triglycerides: 178 mg/dL — ABNORMAL HIGH (ref <150)
VLDL: 36 mg/dL (ref 0–40)

## 2023-04-18 LAB — CBC
HCT: 52.6 % — ABNORMAL HIGH (ref 39.0–52.0)
Hemoglobin: 18 g/dL — ABNORMAL HIGH (ref 13.0–17.0)
MCH: 32.1 pg (ref 26.0–34.0)
MCHC: 34.2 g/dL (ref 30.0–36.0)
MCV: 93.8 fL (ref 80.0–100.0)
Platelets: 121 10*3/uL — ABNORMAL LOW (ref 150–400)
RBC: 5.61 MIL/uL (ref 4.22–5.81)
RDW: 12.5 % (ref 11.5–15.5)
WBC: 8.5 10*3/uL (ref 4.0–10.5)
nRBC: 0 % (ref 0.0–0.2)

## 2023-04-18 LAB — BRAIN NATRIURETIC PEPTIDE: B Natriuretic Peptide: 186.4 pg/mL — ABNORMAL HIGH (ref 0.0–100.0)

## 2023-04-18 MED ORDER — TORSEMIDE 20 MG PO TABS: 10.0000 mg | ORAL_TABLET | ORAL | Status: DC | PRN

## 2023-04-18 MED ORDER — ENTRESTO 97-103 MG PO TABS: 1.0000 | ORAL_TABLET | Freq: Two times a day (BID) | ORAL | 3 refills | Status: DC

## 2023-04-18 MED ORDER — SPIRONOLACTONE 25 MG PO TABS: 25.0000 mg | ORAL_TABLET | Freq: Every day | ORAL | 3 refills | Status: DC

## 2023-04-18 MED ORDER — EMPAGLIFLOZIN 10 MG PO TABS: 10.0000 mg | ORAL_TABLET | Freq: Every day | ORAL | 3 refills | Status: DC

## 2023-04-18 NOTE — Progress Notes (Signed)
PCP: Mercie Eon, MD Cardiology: Dr. Shirlee Latch  77 y.o. with history of COPD, DVT/PE, ischemic cardiomyopathy, and CAD presents for followup of CHF.  Patient suffered a myocardial infarction complicated by cardiac arrest in 2002 and required stenting of the obtuse marginal.  He notes that he subsequently had LV dysfunction.  In 2013, he required repeat diagnostic catheterization which revealed moderate disease distal to previously placed obtuse marginal stent with a 75% stenosis in the mid LAD with aneurysmal post stenotic dilation.  Per notes, he was medically managed.  In the setting of LV dysfunction with an EF as low as 20%, he underwent Medtronic AICD placement in April 2016, and up until 2022, he was followed closely by cardiology in [place], [place].  He did require admission to a hospital in [facility] in June 2002, in the setting of COPD exacerbation while visiting his son.  Per discharge summary, he was noted to have mild troponin elevation which was felt to be secondary to demand ischemia.   Mr. Coupland moved to [place] in December 2022 and has been living locally.  His ex-wife lives nearby and they are close friends.  On October 12 2021, he developed recurrent chest discomfort and was admitted to [facility] with NSTEMI.  He underwent diagnostic catheterization at [facility] regional revealing severe proximal to mid LAD disease with otherwise nonobstructive disease and patent obtuse marginal stent.  His LVEDP was approximately 40 mmHg and ventriculogram showed an EF of less than 25%.  He was given Lasix in the lab and after discussion with advanced heart failure team at [facility], he was transferred for further evaluation and potential inotropic support.  At [facility], he was diuresed and started on GDMT.  He had DES x 2 to proximal and mid LAD and was discharged home.   Echo in 10/23 showed EF 30-35%, low normal RV function.   Patient is still smoking but has cut back.  Did not want to try  Chantix.  Weight up about 5 lbs.  Short of breath with heavy activity.  Does fine walking on flat ground and up stairs.  No chest pain.  No lightheadedness.  No orthopnea/PND.   Labs (3/23): K 4.6, creatinine 1.02, LDL 59, hgb 17.7, plts 98K Labs (5/23): K 4.6, creatinine 1.44, LDL 56, JAK2 mutation negative, hgb 17.9, plts 123 Labs (2/24): K 4.6, creatinine 1.27 Labs (6/24): K 4.6, creatinine 1.4, BNP 108, LDL 81  PMH: 1. COPD: Still smoking 2. DVT/PE 3. Chronic systolic CHF: Ischemic cardiomyopathy.  Medtronic ICD from 4/16.  - Echo (3/23): EF 20-25% - RHC (3/23): mean RA 25, PA 62/47, mean PCWP 39, CI 2.03.  - Echo (10/23): EF 30-35%, low normal RV function.  4. CAD: MI with OM2 stent in 2002.  - NSTEMI 3/23 with DES x 2 to proximal and mid LAD.  5. 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  6. Bladder cancer 2012: Chemoradiation.  7. Type 2 DM 8. Polycythemia: Likely due to chronic smoking, JAK2 mutation negative.  9. 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  Quit smoking in 6/23 then restarted.  No ETOH.   Family History  Problem Relation Age of Onset   Stroke Mother    Bladder Cancer Father    Heart attack Brother    ROS: All systems reviewed and negative except as per HPI.   Current Outpatient Medications  Medication Sig Dispense Refill   albuterol (PROVENTIL) (2.5 MG/3ML) 0.083% nebulizer solution Take 3 mLs (  2.5 mg total) by nebulization every 6 (six) hours as needed for wheezing or shortness of breath. 540 mL 3   apixaban (ELIQUIS) 5 MG TABS tablet Take 1 tablet (5 mg total) by mouth 2 (two) times daily. 180 tablet 3   bisoprolol (ZEBETA) 10 MG tablet Take 1 tablet (10 mg total) by mouth daily. 90 tablet 3   Cholecalciferol (VITAMIN D-3) 25 MCG (1000 UT) CAPS Take 1 capsule by mouth daily.     ENTRESTO 49-51 MG TAKE 1 TABLET TWICE A DAY (CHANGE IN DOSAGE ) 180 tablet 3   ezetimibe (ZETIA) 10 MG tablet Take 1 tablet (10 mg  total) by mouth daily. 90 tablet 3   fluticasone furoate-vilanterol (BREO ELLIPTA) 100-25 MCG/ACT AEPB Inhale 1 puff into the lungs daily. 60 each 2   Multiple Vitamins-Minerals (MULTIVITAMIN WITH MINERALS) tablet Take 1 tablet by mouth daily.     nitroGLYCERIN (NITROSTAT) 0.4 MG SL tablet Place 1 tablet (0.4 mg total) under the tongue every 5 (five) minutes as needed for chest pain. 25 tablet 3   nystatin (MYCOSTATIN) 100000 UNIT/ML suspension Take 2 mLs (200,000 Units total) by mouth 4 (four) times daily. 473 mL 0   Pitavastatin Calcium 4 MG TABS TAKE 1 TABLET DAILY 90 tablet 3   sacubitril-valsartan (ENTRESTO) 97-103 MG Take 1 tablet by mouth 2 (two) times daily. 180 tablet 3   sildenafil (VIAGRA) 50 MG tablet Take 1 tablet (50 mg total) by mouth daily as needed for erectile dysfunction. 30 tablet 0   empagliflozin (JARDIANCE) 10 MG TABS tablet Take 1 tablet (10 mg total) by mouth daily. 90 tablet 3   spironolactone (ALDACTONE) 25 MG tablet Take 1 tablet (25 mg total) by mouth daily. 90 tablet 3   torsemide (DEMADEX) 20 MG tablet Take 0.5 tablets (10 mg total) by mouth as needed. Take PRN for weight gain/edema. (3lbs in 1 day or 5 lbs in 1 week)     Varenicline Tartrate, Starter, (CHANTIX STARTING MONTH PAK) 0.5 MG X 11 & 1 MG X 42 TBPK Day 1-3 .5mg  daily, Day 4-7 .5mg  Twice daily, Day 8 onward 1 mg Twice daily (Patient not taking: Reported on 04/18/2023) 53 each 0   No current facility-administered medications for this visit.   BP (!) 104/58   Pulse 79   Wt 268 lb (121.6 kg)   SpO2 94%   BMI 38.45 kg/m  General: NAD Neck: No JVD, no thyromegaly or thyroid nodule.  Lungs: Distant breath sounds.  CV: Nondisplaced PMI.  Heart regular S1/S2, no S3/S4, no murmur.  1+ ankle edema.  No carotid bruit.  Normal pedal pulses.  Abdomen: Soft, nontender, no hepatosplenomegaly, no distention.  Skin: Intact without lesions or rashes.  Neurologic: Alert and oriented x 3.  Psych: Normal  affect. Extremities: No clubbing or cyanosis.  HEENT: Normal.   Assessment/Plan: 1.  CAD: Patient has history of CAD with prior PCI to OM in 2002.  NSTEMI in 3/23 with cath showing complex proximal LAD disease with serial 90% stenoses separated by small aneurysmal areas.  He had a 60% mid LCx stenosis.  RCA with mild disease.  He had DES x 2 to proximal and mid LAD. No chest pain.  - No ASA given stable CAD with Eliquis use.  - Continue Livalo and Zetia, check lipids/LFTs today.   2. Chronic systolic CHF: Medtronic ICD.  Ischemic cardiomyopathy.  Echo in 3/23 with EF 20-25%, severe LV dilation, RV mildly dysfunctional.  RHC in 3/23 with markedly  elevated filling pressures, CI 2.03, and PAPI < 1. Echo in 10/23 with EF 30-35%, low normal RV function. NYHA class II.  Not volume overloaded on exam.  - He can cut back torsemide to prn as we will increase Entresto today.   - Continue bisoprolol 10 mg daily (beta-1 selective with COPD).  - Increase Entresto to 97/103 bid.  BMET/BNP today, BMET in 10 days.     - Continue empagliflozin 10 mg daily.  - Continue spironolactone 25 mg daily.  - QRS does not appear wide enough to benefit from CRT upgrade.   - Will get echo at followup in 3 months.  3. 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   - Continue apixaban.  4. COPD: He restarted smoking. Does not want Chantix, trying to quit on his own.   5. Thrombocytopenia: Mild, chronic.  6. Bladder cancer: Has been followed by cystoscopies   7. Polycythemia: JAK2 mutation negative, likely due to chronic smoking.   Followup in 3 months with echo   Marca Ancona 04/18/2023

## 2023-04-18 NOTE — Patient Instructions (Addendum)
START ENTRESTO 97-103 TWICE DAILY   (You can take ENTRESTO 49-51 MG 2 tablets twice daily until you run out or get your new prescription)  Only take your TORSEMIDE AS NEEDED for the future. You do NOT need to continue tasking this every other day.  Go over to the MEDICAL MALL. Go pass the gift shop and have your blood work completed.  You will also repeat your blood work in 10 days due to medication changes. This will also be in the MEDICAL MALL.  Have your echocardiogram completed in December. You will check in at the  [facility]ICAL MALL for this 15 mins before your appt time for preparation.  Dr. Alford Highland schedule isn't yet open for December. We will put you on our recall list and give you a call closer to that time once the schedule is open.

## 2023-04-19 ENCOUNTER — Encounter: Payer: Medicare Other | Admitting: Cardiology

## 2023-04-22 ENCOUNTER — Other Ambulatory Visit: Payer: Self-pay

## 2023-04-22 DIAGNOSIS — J449 Chronic obstructive pulmonary disease, unspecified: Secondary | ICD-10-CM

## 2023-04-23 MED ORDER — FLUTICASONE FUROATE-VILANTEROL 100-25 MCG/ACT IN AEPB
1.0000 | INHALATION_SPRAY | Freq: Every day | RESPIRATORY_TRACT | 3 refills | Status: DC
Start: 2023-04-23 — End: 2024-03-02

## 2023-04-29 ENCOUNTER — Other Ambulatory Visit
Admission: RE | Admit: 2023-04-29 | Discharge: 2023-04-29 | Disposition: A | Discharge status: Home / Self Care | Payer: Medicare Other | Source: Ambulatory Visit | Attending: Cardiology | Admitting: Cardiology

## 2023-04-29 DIAGNOSIS — I5022 Chronic systolic (congestive) heart failure: Secondary | ICD-10-CM | POA: Diagnosis present

## 2023-04-29 LAB — BASIC METABOLIC PANEL
Anion gap: 9 (ref 5–15)
BUN: 18 mg/dL (ref 8–23)
CO2: 23 mmol/L (ref 22–32)
Calcium: 9.1 mg/dL (ref 8.9–10.3)
Chloride: 108 mmol/L (ref 98–111)
Creatinine, Ser: 1.16 mg/dL (ref 0.61–1.24)
GFR, Estimated: >60 mL/min (ref >60)
Glucose, Bld: 125 mg/dL — ABNORMAL HIGH (ref 70–99)
Potassium: 4.2 mmol/L (ref 3.5–5.1)
Sodium: 140 mmol/L (ref 135–145)

## 2023-05-22 ENCOUNTER — Ambulatory Visit (INDEPENDENT_AMBULATORY_CARE_PROVIDER_SITE_OTHER): Payer: Medicare Other

## 2023-05-22 DIAGNOSIS — I255 Ischemic cardiomyopathy: Secondary | ICD-10-CM | POA: Diagnosis not present

## 2023-05-22 DIAGNOSIS — I5022 Chronic systolic (congestive) heart failure: Secondary | ICD-10-CM

## 2023-05-22 LAB — CUP PACEART REMOTE DEVICE CHECK
Battery Remaining Longevity: 20 mo
Battery Voltage: 2.92 V
Brady Statistic AP VP Percent: 0.01 %
Brady Statistic AP VS Percent: 16.69 %
Brady Statistic AS VP Percent: 0.05 %
Brady Statistic AS VS Percent: 83.25 %
Brady Statistic RA Percent Paced: 16.27 %
Brady Statistic RV Percent Paced: 0.06 %
Date Time Interrogation Session: 20241016001605
HighPow Impedance: 89 Ohm
Implantable Lead Connection Status: 753985
Implantable Lead Connection Status: 753985
Implantable Lead Implant Date: 20160412
Implantable Lead Implant Date: 20160412
Implantable Lead Location: 753859
Implantable Lead Location: 753860
Implantable Lead Model: 5076
Implantable Lead Model: 6935
Implantable Pulse Generator Implant Date: 20160412
Lead Channel Impedance Value: 304 Ohm
Lead Channel Impedance Value: 361 Ohm
Lead Channel Impedance Value: 475 Ohm
Lead Channel Pacing Threshold Amplitude: 0.625 V
Lead Channel Pacing Threshold Amplitude: 2.375 V
Lead Channel Pacing Threshold Pulse Width: 0.4 ms
Lead Channel Pacing Threshold Pulse Width: 0.4 ms
Lead Channel Sensing Intrinsic Amplitude: 4.625 mV
Lead Channel Sensing Intrinsic Amplitude: 4.625 mV
Lead Channel Sensing Intrinsic Amplitude: 5 mV
Lead Channel Sensing Intrinsic Amplitude: 5 mV
Lead Channel Setting Pacing Amplitude: 1.5 V
Lead Channel Setting Pacing Amplitude: 2.5 V
Lead Channel Setting Pacing Pulse Width: 1 ms
Lead Channel Setting Sensing Sensitivity: 0.3 mV
Zone Setting Status: 755011

## 2023-06-11 NOTE — Progress Notes (Signed)
Remote ICD transmission.   

## 2023-07-11 ENCOUNTER — Ambulatory Visit
Admission: RE | Admit: 2023-07-11 | Discharge: 2023-07-11 | Disposition: A | Discharge status: Home / Self Care | Payer: Medicare Other | Source: Ambulatory Visit | Attending: Cardiology | Admitting: Cardiology

## 2023-07-11 DIAGNOSIS — I5022 Chronic systolic (congestive) heart failure: Secondary | ICD-10-CM | POA: Insufficient documentation

## 2023-07-11 DIAGNOSIS — Z95810 Presence of automatic (implantable) cardiac defibrillator: Secondary | ICD-10-CM | POA: Insufficient documentation

## 2023-07-11 DIAGNOSIS — I2699 Other pulmonary embolism without acute cor pulmonale: Secondary | ICD-10-CM | POA: Diagnosis not present

## 2023-07-11 DIAGNOSIS — I110 Hypertensive heart disease with heart failure: Secondary | ICD-10-CM | POA: Insufficient documentation

## 2023-07-11 DIAGNOSIS — J449 Chronic obstructive pulmonary disease, unspecified: Secondary | ICD-10-CM | POA: Diagnosis not present

## 2023-07-11 LAB — ECHOCARDIOGRAM COMPLETE
AR max vel: 2.46 cm2
AV Area VTI: 2.87 cm2
AV Area mean vel: 2.55 cm2
AV Mean grad: 1 mm[Hg]
AV Peak grad: 2.5 mm[Hg]
Ao pk vel: 0.8 m/s
Area-P 1/2: 3.43 cm2
Calc EF: 24.7 %
S' Lateral: 4.9 cm
Single Plane A2C EF: 17.2 %
Single Plane A4C EF: 30.1 %

## 2023-07-11 NOTE — Progress Notes (Signed)
*  PRELIMINARY RESULTS* Echocardiogram 2D Echocardiogram has been performed.  Cristela Blue 07/11/2023, 11:16 AM

## 2023-07-17 ENCOUNTER — Ambulatory Visit: Payer: Medicare Other | Attending: Cardiology | Admitting: Cardiology

## 2023-07-17 VITALS — BP 102/58 | HR 78 | Wt 268.0 lb

## 2023-07-17 DIAGNOSIS — I255 Ischemic cardiomyopathy: Secondary | ICD-10-CM

## 2023-07-17 DIAGNOSIS — I5022 Chronic systolic (congestive) heart failure: Secondary | ICD-10-CM

## 2023-07-17 NOTE — Patient Instructions (Signed)
 Medication Changes:  None, continue current medications  Lab Work:  Labs done today, your results will be available in MyChart, we will contact you for abnormal readings.   Special Instructions // Education:  Do the following things EVERYDAY: Weigh yourself in the morning before breakfast. Write it down and keep it in a log. Take your medicines as prescribed Eat low salt foods--Limit salt (sodium) to 2000 mg per day.  Stay as active as you can everyday Limit all fluids for the day to less than 2 liters   Follow-Up in: 4 months (April 2025), **WE WILL CALL YOU CLOSER TO THIS TIME TO SCHEDULE    If you have any questions or concerns before your next appointment please send [place] a message through mychart or call our office at [phone] Monday-Friday 8 am-5 pm.   If you have an urgent need after hours on the weekend please call your Primary Cardiologist or the [facility] in [place] at [phone].   At the [facility], you and your health needs are our priority. We have a designated team specialized in the treatment of Heart Failure. This Care Team includes your primary Heart Failure Specialized Cardiologist (physician), Advanced Practice Providers (APPs- Physician Assistants and Nurse Practitioners), and Pharmacist who all work together to provide you with the care you need, when you need it.   You may see any of the following providers on your designated Care Team at your next follow up:  Dr. Arvilla Meres Dr. Marca Ancona Dr. Dorthula Nettles Dr. Theresia Bough Tonye Becket, NP Robbie Lis, [place] [address] [place], [place] Brynda Peon, NP [place] Lee, NP Clarisa Kindred, NP Enos Fling, PharmD

## 2023-07-17 NOTE — Progress Notes (Signed)
PCP: Mercie Eon, MD Cardiology: Dr. Shirlee Latch  77 y.o. with history of COPD, DVT/PE, ischemic cardiomyopathy, and CAD presents for followup of CHF.  Patient suffered a myocardial infarction complicated by cardiac arrest in 2002 and required stenting of the obtuse marginal.  He notes that he subsequently had LV dysfunction.  In 2013, he required repeat diagnostic catheterization which revealed moderate disease distal to previously placed obtuse marginal stent with a 75% stenosis in the mid LAD with aneurysmal post stenotic dilation.  Per notes, he was medically managed.  In the setting of LV dysfunction with an EF as low as 20%, he underwent Medtronic AICD placement in April 2016, and up until 2022, he was followed closely by cardiology in [place], [place].  He did require admission to a hospital in [facility] in June 2002, in the setting of COPD exacerbation while visiting his son.  Per discharge summary, he was noted to have mild troponin elevation which was felt to be secondary to demand ischemia.   Mr. Berdan moved to [place] in December 2022.  His ex-wife lives nearby and they are close friends.  On October 12 2021, he developed recurrent chest discomfort and was admitted to [facility] with NSTEMI.  He underwent diagnostic catheterization at [facility] regional revealing severe proximal to mid LAD disease with otherwise nonobstructive disease and patent obtuse marginal stent.  His LVEDP was approximately 40 mmHg and ventriculogram showed an EF of less than 25%.  He was given Lasix in the lab and after discussion with advanced heart failure team at [facility], he was transferred for further evaluation and potential inotropic support.  At [facility], he was diuresed and started on GDMT.  He had DES x 2 to proximal and mid LAD and was discharged home.   Echo in 10/23 showed EF 30-35%, low normal RV function.  Echo in 12/24 showed EF 25-30%, normal RV, no MR, normal IVC.   Patient is still smoking but  has cut back.  Did not want to try Chantix.  Weight is stable.  He is mildly short of breath with a long walk, noted some dyspnea after spending 3 hrs in the Medtronic in [place] recently. No problems with stairs.  No orthopnea/PND.  No chest pain.  No lightheadedness.   Labs (3/23): K 4.6, creatinine 1.02, LDL 59, hgb 17.7, plts 98K Labs (5/23): K 4.6, creatinine 1.44, LDL 56, JAK2 mutation negative, hgb 17.9, plts 123 Labs (2/24): K 4.6, creatinine 1.27 Labs (6/24): K 4.6, creatinine 1.4, BNP 108, LDL 81 Labs (9/24): K 4.2, creatinine 1.16, BNP 186, LDL 32, TGs 178  ECG (personally reviewed): NSR, old inferior MI  PMH: 1. COPD: Still smoking 2. DVT/PE 3. Chronic systolic CHF: Ischemic cardiomyopathy.  Medtronic ICD from 4/16.  - Echo (3/23): EF 20-25% - RHC (3/23): mean RA 25, PA 62/47, mean PCWP 39, CI 2.03.  - Echo (10/23): EF 30-35%, low normal RV function.  - Echo (12/24): EF 25-30%, normal RV, no MR, normal IVC. 4. CAD: MI with OM2 stent in 2002.  - NSTEMI 3/23 with DES x 2 to proximal and mid LAD.  5. 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  6. Bladder cancer 2012: Chemoradiation.  7. Type 2 DM 8. Polycythemia: Likely due to chronic smoking, JAK2 mutation negative.  9. 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  Quit smoking in 6/23 then restarted.  No ETOH.   Family History  Problem Relation Age of Onset  Stroke Mother    Bladder Cancer Father    Heart attack Brother    ROS: All systems reviewed and negative except as per HPI.   Current Outpatient Medications  Medication Sig Dispense Refill   albuterol (PROVENTIL) (2.5 MG/3ML) 0.083% nebulizer solution Take 3 mLs (2.5 mg total) by nebulization every 6 (six) hours as needed for wheezing or shortness of breath. 540 mL 3   apixaban (ELIQUIS) 5 MG TABS tablet Take 1 tablet (5 mg total) by mouth 2 (two) times daily. 180 tablet 3   bisoprolol (ZEBETA) 10 MG tablet Take 1  tablet (10 mg total) by mouth daily. 90 tablet 3   Cholecalciferol (VITAMIN D-3) 25 MCG (1000 UT) CAPS Take 1 capsule by mouth daily.     empagliflozin (JARDIANCE) 10 MG TABS tablet Take 1 tablet (10 mg total) by mouth daily. 90 tablet 3   fluticasone furoate-vilanterol (BREO ELLIPTA) 100-25 MCG/ACT AEPB Inhale 1 puff into the lungs daily. 90 each 3   Multiple Vitamins-Minerals (MULTIVITAMIN WITH MINERALS) tablet Take 1 tablet by mouth daily.     nystatin (MYCOSTATIN) 100000 UNIT/ML suspension Take 2 mLs (200,000 Units total) by mouth 4 (four) times daily. 473 mL 0   Pitavastatin Calcium 4 MG TABS TAKE 1 TABLET DAILY 90 tablet 3   sacubitril-valsartan (ENTRESTO) 97-103 MG Take 1 tablet by mouth 2 (two) times daily. 180 tablet 3   spironolactone (ALDACTONE) 25 MG tablet Take 1 tablet (25 mg total) by mouth daily. 90 tablet 3   torsemide (DEMADEX) 20 MG tablet Take 0.5 tablets (10 mg total) by mouth as needed. Take PRN for weight gain/edema. (3lbs in 1 day or 5 lbs in 1 week)     ezetimibe (ZETIA) 10 MG tablet Take 1 tablet (10 mg total) by mouth daily. 90 tablet 3   No current facility-administered medications for this visit.   BP (!) 102/58   Pulse 78   Wt 268 lb (121.6 kg)   SpO2 94%   BMI 38.45 kg/m  General: NAD Neck: Thick, neck. No JVD, no thyromegaly or thyroid nodule.  Lungs: Clear to auscultation bilaterally with normal respiratory effort. CV: Nondisplaced PMI.  Heart regular S1/S2, no S3/S4, no murmur.  1+ ankle edema.  No carotid bruit.  Normal pedal pulses.  Abdomen: Soft, nontender, no hepatosplenomegaly, no distention.  Skin: Intact without lesions or rashes.  Neurologic: Alert and oriented x 3.  Psych: Normal affect. Extremities: No clubbing or cyanosis.  HEENT: Normal.   Assessment/Plan: 1.  CAD: Patient has history of CAD with prior PCI to OM in 2002.  NSTEMI in 3/23 with cath showing complex proximal LAD disease with serial 90% stenoses separated by small aneurysmal  areas.  He had a 60% mid LCx stenosis.  RCA with mild disease.  He had DES x 2 to proximal and mid LAD. No chest pain.  - No ASA given stable CAD with Eliquis use.  - Continue Livalo and Zetia, good lipids in 9/24.   2. Chronic systolic CHF: Medtronic ICD.  Ischemic cardiomyopathy.  Echo in 3/23 with EF 20-25%, severe LV dilation, RV mildly dysfunctional.  RHC in 3/23 with markedly elevated filling pressures, CI 2.03, and PAPI < 1. Echo in 10/23 with EF 30-35%, low normal RV function.  Echo in 12/24 showed EF 25-30%, normal RV, no MR, normal IVC.  NYHA class II.  He is not volume overloaded on exam and weight is stable.  - Continue torsemide prn.   - Continue bisoprolol 10  mg daily (beta-1 selective with COPD).  - Continue Entresto 97/103 bid.  BMET/BNP today.     - Continue empagliflozin 10 mg daily.  - Continue spironolactone 25 mg daily.  - QRS does not appear wide enough to benefit from CRT upgrade.   - He would be a reasonable candidate for barostimulation activation therapy or CCM if symptoms worsen.  3. 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   - Continue apixaban.  4. COPD: He continues to smoke. Does not want Chantix, trying to quit on his own.   5. Thrombocytopenia: Mild, chronic.  6. Bladder cancer: Has been followed by cystoscopies   7. Polycythemia: JAK2 mutation negative, likely due to chronic smoking.   Followup in 4 months.   Marca Ancona 07/17/2023

## 2023-07-19 LAB — BASIC METABOLIC PANEL
BUN/Creatinine Ratio: 13 (ref 10–24)
BUN: 15 mg/dL (ref 8–27)
CO2: 23 mmol/L (ref 20–29)
Calcium: 9.3 mg/dL (ref 8.6–10.2)
Chloride: 106 mmol/L (ref 96–106)
Creatinine, Ser: 1.2 mg/dL (ref 0.76–1.27)
Glucose: 132 mg/dL — ABNORMAL HIGH (ref 70–99)
Potassium: 4.6 mmol/L (ref 3.5–5.2)
Sodium: 142 mmol/L (ref 134–144)
eGFR: 63 mL/min/{1.73_m2} (ref >59)

## 2023-07-19 LAB — BRAIN NATRIURETIC PEPTIDE: BNP: 147.1 pg/mL — ABNORMAL HIGH (ref 0.0–100.0)

## 2023-08-21 ENCOUNTER — Ambulatory Visit (INDEPENDENT_AMBULATORY_CARE_PROVIDER_SITE_OTHER): Payer: Medicare Other

## 2023-08-21 DIAGNOSIS — I255 Ischemic cardiomyopathy: Secondary | ICD-10-CM | POA: Diagnosis not present

## 2023-08-21 LAB — CUP PACEART REMOTE DEVICE CHECK
Battery Remaining Longevity: 18 mo
Battery Voltage: 2.91 V
Brady Statistic AP VP Percent: 0.01 %
Brady Statistic AP VS Percent: 18.31 %
Brady Statistic AS VP Percent: 0.06 %
Brady Statistic AS VS Percent: 81.63 %
Brady Statistic RA Percent Paced: 18.12 %
Brady Statistic RV Percent Paced: 0.07 %
Date Time Interrogation Session: 20250115043626
HighPow Impedance: 84 Ohm
Implantable Lead Connection Status: 753985
Implantable Lead Connection Status: 753985
Implantable Lead Implant Date: 20160412
Implantable Lead Implant Date: 20160412
Implantable Lead Location: 753859
Implantable Lead Location: 753860
Implantable Lead Model: 5076
Implantable Lead Model: 6935
Implantable Pulse Generator Implant Date: 20160412
Lead Channel Impedance Value: 304 Ohm
Lead Channel Impedance Value: 361 Ohm
Lead Channel Impedance Value: 456 Ohm
Lead Channel Pacing Threshold Amplitude: 0.625 V
Lead Channel Pacing Threshold Amplitude: 2.375 V
Lead Channel Pacing Threshold Pulse Width: 0.4 ms
Lead Channel Pacing Threshold Pulse Width: 0.4 ms
Lead Channel Sensing Intrinsic Amplitude: 5 mV
Lead Channel Sensing Intrinsic Amplitude: 5 mV
Lead Channel Sensing Intrinsic Amplitude: 7.125 mV
Lead Channel Sensing Intrinsic Amplitude: 7.125 mV
Lead Channel Setting Pacing Amplitude: 1.5 V
Lead Channel Setting Pacing Amplitude: 2.5 V
Lead Channel Setting Pacing Pulse Width: 1 ms
Lead Channel Setting Sensing Sensitivity: 0.3 mV
Zone Setting Status: 755011

## 2023-09-28 IMAGING — DX DG CHEST 1V PORT
1 series · 1 of 1 positions shown · non-contrast
Comparison: None available.

CLINICAL DATA: Shortness of breath.  History of COPD.

EXAM:
PORTABLE CHEST 1 VIEW

[chest ap]
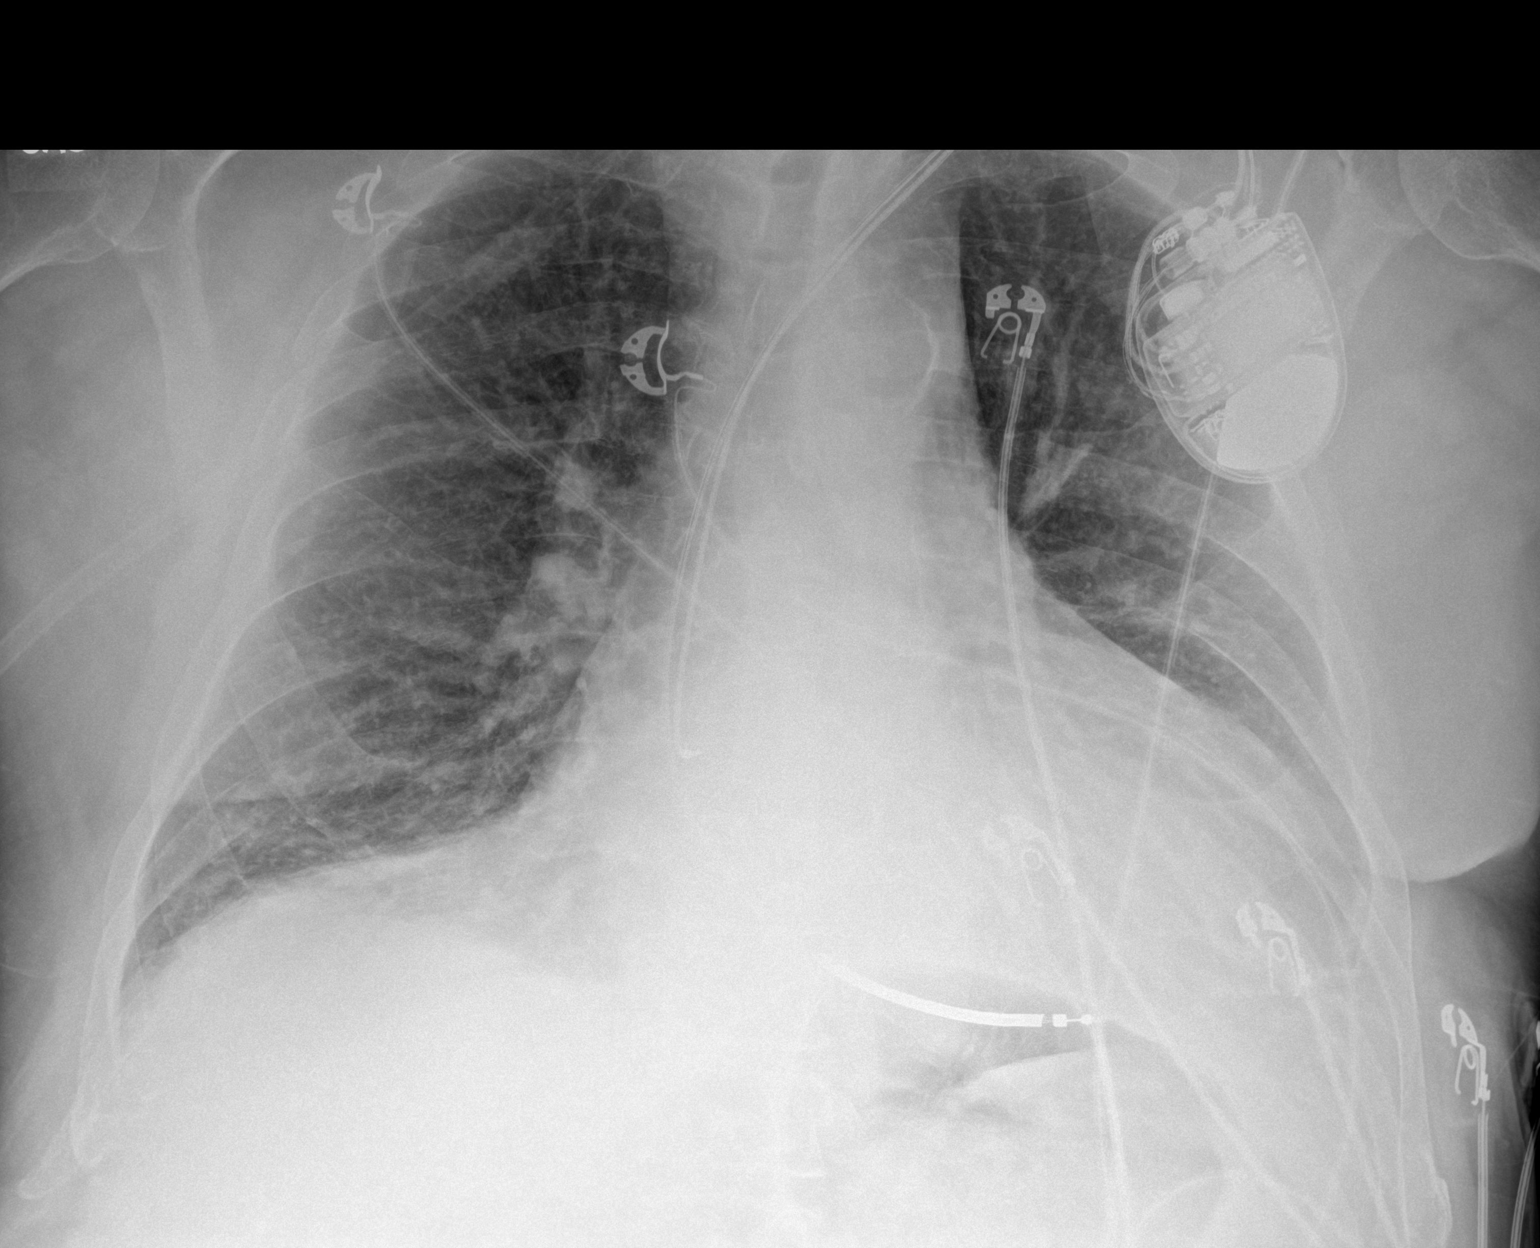

[1 of 1 positions shown; findings below may reference images not displayed]

FINDINGS: Dual lead left-sided pacemaker in place. The heart is enlarged.
Aortic atherosclerosis. There is diffuse peribronchial thickening
and interstitial coarsening. Limited left basilar assessment due to
soft tissue attenuation from habitus. No confluent airspace disease.
No large pleural effusion. No pneumothorax.
IMPRESSION: Cardiomegaly. Diffuse peribronchial thickening and interstitial
coarsening, may represent pulmonary edema, acute bronchitis, or
chronic lung disease. No prior exams available for comparison.

Aortic Atherosclerosis ([id number]).

## 2023-10-01 NOTE — Progress Notes (Signed)
 Remote ICD transmission.

## 2023-10-02 ENCOUNTER — Encounter: Payer: Self-pay | Admitting: Internal Medicine

## 2023-11-20 ENCOUNTER — Ambulatory Visit (INDEPENDENT_AMBULATORY_CARE_PROVIDER_SITE_OTHER): Payer: Medicare Other

## 2023-11-20 DIAGNOSIS — I255 Ischemic cardiomyopathy: Secondary | ICD-10-CM

## 2023-11-21 LAB — CUP PACEART REMOTE DEVICE CHECK
Battery Remaining Longevity: 16 mo
Battery Voltage: 2.9 V
Brady Statistic AP VP Percent: 0.02 %
Brady Statistic AP VS Percent: 31.63 %
Brady Statistic AS VP Percent: 0.04 %
Brady Statistic AS VS Percent: 68.31 %
Brady Statistic RA Percent Paced: 31.32 %
Brady Statistic RV Percent Paced: 0.06 %
Date Time Interrogation Session: 20250416052824
HighPow Impedance: 82 Ohm
Implantable Lead Connection Status: 753985
Implantable Lead Connection Status: 753985
Implantable Lead Implant Date: 20160412
Implantable Lead Implant Date: 20160412
Implantable Lead Location: 753859
Implantable Lead Location: 753860
Implantable Lead Model: 5076
Implantable Lead Model: 6935
Implantable Pulse Generator Implant Date: 20160412
Lead Channel Impedance Value: 266 Ohm
Lead Channel Impedance Value: 323 Ohm
Lead Channel Impedance Value: 456 Ohm
Lead Channel Pacing Threshold Amplitude: 0.625 V
Lead Channel Pacing Threshold Amplitude: 1.75 V
Lead Channel Pacing Threshold Pulse Width: 0.4 ms
Lead Channel Pacing Threshold Pulse Width: 0.4 ms
Lead Channel Sensing Intrinsic Amplitude: 5.375 mV
Lead Channel Sensing Intrinsic Amplitude: 5.375 mV
Lead Channel Sensing Intrinsic Amplitude: 5.875 mV
Lead Channel Sensing Intrinsic Amplitude: 5.875 mV
Lead Channel Setting Pacing Amplitude: 1.5 V
Lead Channel Setting Pacing Amplitude: 2.5 V
Lead Channel Setting Pacing Pulse Width: 1 ms
Lead Channel Setting Sensing Sensitivity: 0.3 mV
Zone Setting Status: 755011

## 2023-12-04 ENCOUNTER — Telehealth: Payer: Self-pay | Admitting: Internal Medicine

## 2023-12-04 NOTE — Telephone Encounter (Signed)
-----   Message from Nurse Marquette Sites sent at 12/02/2023 11:20 AM EDT ----- Regarding: Needs follow up with Suzann for device adjustment per SK Per Dr. Rodolfo Clan:  Paul Goodwill, MD sent to Paul Ike, RN Device remote reviewed. Remote is normal.  Battery status is good.  Lead measurements unchanged.  Histograms are appropriate.  Paul Bullock could we reprogram this device with three  zones 240 and 210 (10 sec) And 180 with 60 sec duration

## 2023-12-04 NOTE — Telephone Encounter (Signed)
Called to schedule  No answer no VM  

## 2023-12-23 ENCOUNTER — Other Ambulatory Visit (HOSPITAL_COMMUNITY): Payer: Self-pay | Admitting: Cardiology

## 2023-12-23 DIAGNOSIS — I5022 Chronic systolic (congestive) heart failure: Secondary | ICD-10-CM

## 2023-12-24 NOTE — Progress Notes (Addendum)
 [facility]: Clinic Note  Subjective:  History of Present Illness: Paul Bullock is a 78 y.o. year old male who presents for routine follow up of chronic medical conditions. I saw him a year ago.  He has 2 main concerns today.  First, he has noticed a non-painful, hard swelling over his right cheek and jaw. He recently shaved his beard, so had not noticed this before. He's not sure how long the swelling has been there. It is not painful. No history of salivary gland issues. NO trouble breathing or swallowing. He has mild seasonal allergies, but these haven't been active. No weight loss.   Second, for the past 6 weeks, he has experienced changes in his bowel habits. Prior to 6 weeks ago, he always had well formed stools. Now, he is having mushy stools that are light brown in color. He has to use the bathroom consistently 20-30 minutes after eating, no matter what food. Some stools have been hard to flush. Not associated with nausea, vomiting, bloody stools, or abdominal pain. He has had some intermittent LLQ discomfort. He has not had any recent changes to meds or diet, and no recent sick contacts.   We also reviewed his medicines and checked in on his chronic conditions, which are stable.   Please refer to Assessment and Plan below for full details in Problem-Based Charting.   Past Medical History:  Patient Active Problem List   Diagnosis Date Noted   NSVT (nonsustained ventricular tachycardia) (HCC) 12/25/2023   Obesity, morbid (HCC) 12/25/2023   Diabetes due to undrl condition w oth diabetic neuro comp (HCC) 12/25/2023   Swelling of right parotid gland 12/25/2023   Diarrhea 12/25/2023   Oral thrush 11/14/2022   Intertrigo 11/14/2022   Low back pain 09/05/2022   Erectile dysfunction 09/05/2022   Action tremor 09/05/2022   Dyslipidemia 05/12/2022   Chronic kidney disease, stage 3a (HCC) 05/12/2022   Obesity (BMI 30-39.9) 05/12/2022   COPD exacerbation (HCC)  05/11/2022   COPD (chronic obstructive pulmonary disease) (HCC) 03/28/2022   Healthcare maintenance 03/28/2022   Drug-induced gynecomastia 03/28/2022   Congestive heart failure (HCC) 03/26/2022   Presence of automatic (implantable) cardiac defibrillator 03/26/2022   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   Colon polyps 12/05/2021   Acrochordon 12/05/2021   Thrombocytopenia (HCC) 10/13/2021   Polycythemia 10/13/2021   History of pulmonary embolus (PE) 10/12/2021   Rectal bleeding 10/12/2021   Atherosclerotic heart disease of native coronary artery without angina pectoris 01/06/2021   Essential (primary) hypertension 01/06/2021   Hyperlipidemia 01/06/2021   Type 2 diabetes mellitus without complication (HCC) 01/06/2021   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   Chronic systolic CHF (congestive heart failure) (HCC) 01/06/2021   Tobacco dependence 04/11/2018   History of bladder cancer 03/11/2018      Medications:  Current Outpatient Medications:    albuterol  (PROVENTIL ) (2.5 MG/3ML) 0.083% nebulizer solution, Take 3 mLs (2.5 mg total) by nebulization every 6 (six) hours as needed for wheezing or shortness of breath., Disp: 540 mL, Rfl: 3   bisoprolol  (ZEBETA ) 10 MG tablet, TAKE 1 TABLET DAILY (CHANGE IN DOSAGE OR PILL SIZE), Disp: 90 tablet, Rfl: 3   Cholecalciferol  (VITAMIN D -3) 25 MCG (1000 UT) CAPS, Take 1 capsule by mouth daily., Disp: , Rfl:    clotrimazole  (CLOTRIMAZOLE  ANTI-FUNGAL) 1 % cream, Apply 1 Application topically 2 (two) times daily., Disp: , Rfl:    ELIQUIS  5 MG TABS tablet, TAKE 1 TABLET TWICE  A DAY (CHANGE IN DOSE OR TABLET SIZE), Disp: 180 tablet, Rfl: 3   empagliflozin  (JARDIANCE ) 10 MG TABS tablet, Take 1 tablet (10 mg total) by mouth daily., Disp: 90 tablet, Rfl: 3   ezetimibe  (ZETIA ) 10 MG tablet, TAKE 1 TABLET DAILY, Disp: 90 tablet, Rfl: 3   fluticasone  furoate-vilanterol (BREO ELLIPTA ) 100-25 MCG/ACT AEPB, Inhale 1  puff into the lungs daily., Disp: 90 each, Rfl: 3   Multiple Vitamins-Minerals (MULTIVITAMIN WITH MINERALS) tablet, Take 1 tablet by mouth daily., Disp: , Rfl:    Pitavastatin  Calcium  4 MG TABS, TAKE 1 TABLET DAILY, Disp: 90 tablet, Rfl: 3   sacubitril -valsartan  (ENTRESTO ) 97-103 MG, Take 1 tablet by mouth 2 (two) times daily., Disp: 180 tablet, Rfl: 3   spironolactone  (ALDACTONE ) 25 MG tablet, Take 1 tablet (25 mg total) by mouth daily., Disp: 90 tablet, Rfl: 3   torsemide  (DEMADEX ) 20 MG tablet, Take 0.5 tablets (10 mg total) by mouth as needed. Take PRN for weight gain/edema. (3lbs in 1 day or 5 lbs in 1 week), Disp: , Rfl:    varenicline  (CHANTIX ) 1 MG tablet, Take 0.5 mg by mouth daily x 3 days, then 0.5 mg twice daily x 4 days, then 1 mg twice daily after, Disp: 56 tablet, Rfl: 3   Allergies: Allergies  Allergen Reactions   Atorvastatin Other (See Comments)    Myalgias   Rosuvastatin  Other (See Comments)    myalgias       Objective:   Vitals: Vitals:   12/25/23 0925  BP: 118/62  Pulse: 69  Temp: 97.8 F (36.6 C)  SpO2: 94%     Physical Exam: Physical Exam Constitutional:      Appearance: He is obese.  HENT:     Head:     Comments: +6cm area of swollen, firm tissue over R parotid gland    Mouth/Throat:     Comments: Normal OP, no exudate or lesions inside the mouth Cardiovascular:     Rate and Rhythm: Normal rate and regular rhythm.  Pulmonary:     Effort: Pulmonary effort is normal.     Breath sounds: Normal breath sounds.  Abdominal:     General: Bowel sounds are normal. There is no distension.     Palpations: Abdomen is soft. There is no mass.     Tenderness: There is no abdominal tenderness.  Musculoskeletal:     Comments: +trace b/l symmetric LE edema with warm extremities   Skin:    General: Skin is warm and dry.  Neurological:     Mental Status: He is alert.      Data: Labs, imaging, and micro were reviewed in Epic. Refer to Assessment and Plan  below for full details in Problem-Based Charting.  Assessment & Plan:  Swelling of right parotid gland Ddx of this painless swelling of right parotid gland includes pleomorphic adenoma, Warthin tumor, sialadenitis, or less likely carcinoma.  - Will plan to image, pending labs results. I think will likely get CT (rather than U/S) because subsequent MRI may be difficult with his PPM and I would like to classify the lesion with more detail  Diarrhea - The cause of patient's diarrhea is not clear. He has not had recent infection, changes to medicines, or changes to diet. Not typical for an inflammatory diarrhea. Some of his symptoms seem like steatorrhea - We will start with CBC, CMP, and he is due for his colonoscopy (this got cancelled last year because he had an acute viral illness when  it was scheduled) - If this persists, next steps will be TSH, fecal elastase, and possibly additional stool studies  Atherosclerotic heart disease of native coronary artery without angina pectoris - chronic and stable - Continue Eliquis  5mg  BID, Pitavastatin  4mg  daily, and Zetia  10mg  daily - Check lipids today, goal LDL<70  History of pulmonary embolus (PE) - Patient has had multiple PEs in the setting of non-transient risk factors - Continue Eliquis  5mg  BID indefinitely   Essential (primary) hypertension - chronic and stable - continue bisoprolol  10mg  daily, Entresto  97-103mg  BID, and spironolactone  25mg  daily  Type 2 diabetes mellitus without complication (HCC) - chronic and stable - A1C today - Continue Jardiance  10mg  daily  Chronic systolic CHF (congestive heart failure) (HCC) - Chronic and stable - Had some LE edema today, I encouraged him to take his torsemide  as needed for this swelling - Continue GDMT with bisprolo, entresto , spironolactone , and jardiance  - Established with Dr Rolan and repeat TTE is ordered  Chronic kidney disease, stage 3a (HCC) - chronic and stable - BMP today -  continue jardiance , spironolactone , and entresto  for now. Carefully watch for hyperkalemia  Polycythemia - Chronic and stable - Negative JAK2 mutation - Likely from ongoing smoking - CBC today  Colon polyps - Reviewed report from GI. He had 19 polyps, all adenomas, at 02/2024 colonoscopy, so GI has arranged 4-6 month follow up with him in 05/2024 with plans for repeat colonoscopy    Patient will follow up in 3 months  Mliss Foot, MD

## 2023-12-25 ENCOUNTER — Ambulatory Visit (INDEPENDENT_AMBULATORY_CARE_PROVIDER_SITE_OTHER): Payer: Self-pay | Admitting: Internal Medicine

## 2023-12-25 ENCOUNTER — Encounter: Payer: Self-pay | Admitting: Internal Medicine

## 2023-12-25 VITALS — BP 118/62 | HR 69 | Temp 97.8°F | Wt 266.6 lb

## 2023-12-25 DIAGNOSIS — J449 Chronic obstructive pulmonary disease, unspecified: Secondary | ICD-10-CM

## 2023-12-25 DIAGNOSIS — R197 Diarrhea, unspecified: Secondary | ICD-10-CM | POA: Diagnosis not present

## 2023-12-25 DIAGNOSIS — I130 Hypertensive heart and chronic kidney disease with heart failure and stage 1 through stage 4 chronic kidney disease, or unspecified chronic kidney disease: Secondary | ICD-10-CM

## 2023-12-25 DIAGNOSIS — E0849 Diabetes mellitus due to underlying condition with other diabetic neurological complication: Secondary | ICD-10-CM | POA: Insufficient documentation

## 2023-12-25 DIAGNOSIS — Z7984 Long term (current) use of oral hypoglycemic drugs: Secondary | ICD-10-CM

## 2023-12-25 DIAGNOSIS — C680 Malignant neoplasm of urethra: Secondary | ICD-10-CM

## 2023-12-25 DIAGNOSIS — E119 Type 2 diabetes mellitus without complications: Secondary | ICD-10-CM

## 2023-12-25 DIAGNOSIS — R600 Localized edema: Secondary | ICD-10-CM | POA: Insufficient documentation

## 2023-12-25 DIAGNOSIS — E1122 Type 2 diabetes mellitus with diabetic chronic kidney disease: Secondary | ICD-10-CM | POA: Diagnosis not present

## 2023-12-25 DIAGNOSIS — Z86711 Personal history of pulmonary embolism: Secondary | ICD-10-CM

## 2023-12-25 DIAGNOSIS — Z1211 Encounter for screening for malignant neoplasm of colon: Secondary | ICD-10-CM

## 2023-12-25 DIAGNOSIS — I2510 Atherosclerotic heart disease of native coronary artery without angina pectoris: Secondary | ICD-10-CM

## 2023-12-25 DIAGNOSIS — D751 Secondary polycythemia: Secondary | ICD-10-CM

## 2023-12-25 DIAGNOSIS — D696 Thrombocytopenia, unspecified: Secondary | ICD-10-CM | POA: Diagnosis not present

## 2023-12-25 DIAGNOSIS — K635 Polyp of colon: Secondary | ICD-10-CM

## 2023-12-25 DIAGNOSIS — I5022 Chronic systolic (congestive) heart failure: Secondary | ICD-10-CM | POA: Diagnosis not present

## 2023-12-25 DIAGNOSIS — N1831 Chronic kidney disease, stage 3a: Secondary | ICD-10-CM | POA: Diagnosis not present

## 2023-12-25 DIAGNOSIS — R599 Enlarged lymph nodes, unspecified: Secondary | ICD-10-CM | POA: Diagnosis not present

## 2023-12-25 DIAGNOSIS — I4729 Other ventricular tachycardia: Secondary | ICD-10-CM | POA: Insufficient documentation

## 2023-12-25 DIAGNOSIS — I10 Essential (primary) hypertension: Secondary | ICD-10-CM

## 2023-12-25 DIAGNOSIS — Z6838 Body mass index (BMI) 38.0-38.9, adult: Secondary | ICD-10-CM | POA: Diagnosis not present

## 2023-12-25 MED ORDER — ALBUTEROL SULFATE (2.5 MG/3ML) 0.083% IN NEBU: 2.5000 mg | INHALATION_SOLUTION | Freq: Four times a day (QID) | RESPIRATORY_TRACT | 3 refills | Status: DC | PRN

## 2023-12-25 MED ORDER — NYSTATIN 100000 UNIT/ML MT SUSP: 2.0000 mL | Freq: Four times a day (QID) | OROMUCOSAL | 0 refills | Status: DC

## 2023-12-25 NOTE — Assessment & Plan Note (Signed)
-   The cause of patient's diarrhea is not clear. He has not had recent infection, changes to medicines, or changes to diet. Not typical for an inflammatory diarrhea. Some of his symptoms seem like steatorrhea - We will start with CBC, CMP, and he is due for his colonoscopy (this got cancelled last year because he had an acute viral illness when it was scheduled) - If this persists, next steps will be TSH, fecal elastase, and possibly additional stool studies

## 2023-12-25 NOTE — Assessment & Plan Note (Addendum)
 Ddx of this painless swelling of right parotid gland includes pleomorphic adenoma, Warthin tumor, sialadenitis, or less likely carcinoma.  - Will plan to image, pending labs results. I think will likely get CT (rather than U/S) because subsequent MRI may be difficult with his PPM and I would like to classify the lesion with more detail

## 2023-12-25 NOTE — Assessment & Plan Note (Signed)
-   chronic and stable - continue bisoprolol  10mg  daily, Entresto  97-103mg  BID, and spironolactone  25mg  daily

## 2023-12-25 NOTE — Assessment & Plan Note (Signed)
-   chronic and stable - Continue Eliquis  5mg  BID, Pitavastatin  4mg  daily, and Zetia  10mg  daily - Check lipids today, goal LDL<70

## 2023-12-25 NOTE — Assessment & Plan Note (Signed)
-   Chronic and stable - Had some LE edema today, I encouraged him to take his torsemide  as needed for this swelling - Continue GDMT with bisprolo, entresto , spironolactone , and jardiance  - Established with Dr Mitzie Anda and repeat TTE is ordered

## 2023-12-25 NOTE — Assessment & Plan Note (Signed)
-   Patient has had multiple PEs in the setting of non-transient risk factors - Continue Eliquis  5mg  BID indefinitely

## 2023-12-25 NOTE — Assessment & Plan Note (Signed)
-   chronic and stable - BMP today - continue jardiance , spironolactone , and entresto  for now. Carefully watch for hyperkalemia

## 2023-12-25 NOTE — Assessment & Plan Note (Signed)
-   Chronic and stable - Negative JAK2 mutation - Likely from ongoing smoking - CBC today

## 2023-12-25 NOTE — Assessment & Plan Note (Signed)
-   chronic and stable - A1C today - Continue Jardiance  10mg  daily

## 2023-12-25 NOTE — Patient Instructions (Addendum)
 Thank you, [name] for allowing us  to provide your care today. Today we discussed your stool changes and the lump on your neck.    I have ordered the following labs for you:   Lab Orders         Microalbumin / Creatinine Urine Ratio         Lipid Profile         CBC no Diff         Hemoglobin A1c         CMP14 + Anion Gap      Referrals ordered today:    Referral Orders         Ambulatory referral to Ophthalmology         Ambulatory referral to Gastroenterology      I have ordered the following medication/changed the following medications:    Start the following medications: Meds ordered this encounter  Medications   albuterol  (PROVENTIL ) (2.5 MG/3ML) 0.083% nebulizer solution    Sig: Take 3 mLs (2.5 mg total) by nebulization every 6 (six) hours as needed for wheezing or shortness of breath.    Dispense:  540 mL    Refill:  3   nystatin  (MYCOSTATIN ) 100000 UNIT/ML suspension    Sig: Take 2 mLs (200,000 Units total) by mouth 4 (four) times daily.    Dispense:  240 mL    Refill:  0     Return in about 3 months (around 03/26/2024) for Chronic medical conditions.    Remember:  - I am checking labs today to look for the cause of your stool changes. I also recommend that you get a colonoscopy with GI, and I have placed a referral for this - I am concerned about the lump on your face. I think this is a problem with your salivary gland (also called your parotid gland). We will check labs today, and I will call you with these results and to let you know if I recommend an ultrasound or a CT or the area. In the meantime, you can try using sugar-free sour candies to try to stimulate more saliva production  Should you have any questions or concerns please call the internal medicine clinic at [phone].     Paul Cocke, MD Faculty, Internal Medicine Teaching Progam [facility]

## 2023-12-26 ENCOUNTER — Ambulatory Visit: Payer: Self-pay | Admitting: Internal Medicine

## 2023-12-26 ENCOUNTER — Other Ambulatory Visit: Payer: Self-pay | Admitting: Internal Medicine

## 2023-12-26 DIAGNOSIS — R600 Localized edema: Secondary | ICD-10-CM

## 2023-12-26 DIAGNOSIS — Z1211 Encounter for screening for malignant neoplasm of colon: Secondary | ICD-10-CM

## 2023-12-26 DIAGNOSIS — R197 Diarrhea, unspecified: Secondary | ICD-10-CM

## 2023-12-26 LAB — CMP14 + ANION GAP
ALT: 20 IU/L (ref 0–44)
AST: 19 IU/L (ref 0–40)
Albumin: 4.2 g/dL (ref 3.8–4.8)
Alkaline Phosphatase: 74 IU/L (ref 44–121)
Anion Gap: 20 mmol/L — ABNORMAL HIGH (ref 10.0–18.0)
BUN/Creatinine Ratio: 15 (ref 10–24)
BUN: 18 mg/dL (ref 8–27)
Bilirubin Total: 1.6 mg/dL — ABNORMAL HIGH (ref 0.0–1.2)
CO2: 18 mmol/L — ABNORMAL LOW (ref 20–29)
Calcium: 9.9 mg/dL (ref 8.6–10.2)
Chloride: 104 mmol/L (ref 96–106)
Creatinine, Ser: 1.21 mg/dL (ref 0.76–1.27)
Globulin, Total: 2.4 g/dL (ref 1.5–4.5)
Glucose: 111 mg/dL — ABNORMAL HIGH (ref 70–99)
Potassium: 4.6 mmol/L (ref 3.5–5.2)
Sodium: 142 mmol/L (ref 134–144)
Total Protein: 6.6 g/dL (ref 6.0–8.5)
eGFR: 62 mL/min/{1.73_m2} (ref >59)

## 2023-12-26 LAB — HEMOGLOBIN A1C
Est. average glucose Bld gHb Est-mCnc: 137 mg/dL
Hgb A1c MFr Bld: 6.4 % — ABNORMAL HIGH (ref 4.8–5.6)

## 2023-12-26 LAB — LIPID PANEL
Chol/HDL Ratio: 2.6 ratio (ref 0.0–5.0)
Cholesterol, Total: 112 mg/dL (ref 100–199)
HDL: 43 mg/dL (ref >39)
LDL Chol Calc (NIH): 49 mg/dL (ref 0–99)
Triglycerides: 107 mg/dL (ref 0–149)
VLDL Cholesterol Cal: 20 mg/dL (ref 5–40)

## 2023-12-26 LAB — CBC
Hematocrit: 58.2 % — ABNORMAL HIGH (ref 37.5–51.0)
Hemoglobin: 19.7 g/dL — ABNORMAL HIGH (ref 13.0–17.7)
MCH: 32.9 pg (ref 26.6–33.0)
MCHC: 33.8 g/dL (ref 31.5–35.7)
MCV: 97 fL (ref 79–97)
Platelets: 113 10*3/uL — ABNORMAL LOW (ref 150–450)
RBC: 5.98 x10E6/uL — ABNORMAL HIGH (ref 4.14–5.80)
RDW: 12.3 % (ref 11.6–15.4)
WBC: 8.8 10*3/uL (ref 3.4–10.8)

## 2023-12-26 NOTE — Progress Notes (Unsigned)
 Called patient to review results, but no answer, and VM not set up. Will message.  Placed new order for GI and ordered maxillofacial CT for parotid mass

## 2023-12-27 ENCOUNTER — Telehealth: Payer: Self-pay

## 2023-12-27 ENCOUNTER — Telehealth: Payer: Self-pay | Admitting: Oncology

## 2023-12-27 LAB — MICROALBUMIN / CREATININE URINE RATIO
Creatinine, Urine: 119.7 mg/dL
Microalb/Creat Ratio: 30 mg/g{creat} — ABNORMAL HIGH (ref 0–29)
Microalbumin, Urine: 36.2 ug/mL

## 2023-12-27 NOTE — Telephone Encounter (Signed)
 The patient called back inquiring whether we could transfer his referral to [facility]. I informed him that he would need to contact his referring provider directly to have the referral sent to [facility]. The patient mentioned that [facility] advised him to call us . I reiterated that the referral must come from his provider and provided him with [facility] fax number: [phone].

## 2023-12-27 NOTE — Telephone Encounter (Signed)
 The patient called and left a voicemail requesting to schedule his colonoscopy. I returned the call to confirm that we received his message and transferred him to the scheduler. I advised the patient that if the scheduler does not answer, he should leave a voicemail and allow 24 to 48 hours for a return call.

## 2023-12-27 NOTE — Telephone Encounter (Signed)
 Patient was advised by his PCP to schedule a follow-up appointment with Dr. Adrian Alba. Please advise on scheduling, thanks!  "Your blood counts are pretty high at 19.7. I recommend that you call your Hematologist to schedule a visit with them. They may want to run some more tests or get blood drawn"

## 2023-12-27 NOTE — Telephone Encounter (Signed)
 Patient has been made aware that his chart was reviewed and his pcp noted that he was experiencing some diarrhea for the past few weeks he should be evaluated by physician, however since we are not taking new patients at this time I've asked his PCP to send a new referral to [facility] for him.  New referral was placed by his PCP yesterday and phone number was given for him to contact [facility].  Thanks,  [place], CMA

## 2024-01-01 ENCOUNTER — Ambulatory Visit: Attending: Cardiology | Admitting: Cardiology

## 2024-01-01 ENCOUNTER — Telehealth: Payer: Self-pay | Admitting: Cardiology

## 2024-01-01 ENCOUNTER — Encounter: Payer: Self-pay | Admitting: Cardiology

## 2024-01-01 ENCOUNTER — Ambulatory Visit: Payer: Self-pay | Admitting: Cardiology

## 2024-01-01 VITALS — BP 79/48 | HR 71 | Ht 70.0 in | Wt 261.4 lb

## 2024-01-01 DIAGNOSIS — I4729 Other ventricular tachycardia: Secondary | ICD-10-CM | POA: Diagnosis not present

## 2024-01-01 DIAGNOSIS — I5022 Chronic systolic (congestive) heart failure: Secondary | ICD-10-CM

## 2024-01-01 DIAGNOSIS — Z95810 Presence of automatic (implantable) cardiac defibrillator: Secondary | ICD-10-CM

## 2024-01-01 LAB — CUP PACEART INCLINIC DEVICE CHECK
Date Time Interrogation Session: 20250528153019
Implantable Lead Connection Status: 753985
Implantable Lead Connection Status: 753985
Implantable Lead Implant Date: 20160412
Implantable Lead Implant Date: 20160412
Implantable Lead Location: 753859
Implantable Lead Location: 753860
Implantable Lead Model: 5076
Implantable Lead Model: 6935
Implantable Pulse Generator Implant Date: 20160412

## 2024-01-01 NOTE — Progress Notes (Signed)
 Electrophysiology Clinic Note    Date:  01/01/2024  Patient ID:  Paul Bullock 01-Oct-1945, MRN [id number] PCP:  Driscilla George, MD  Cardiologist:  Ardeen Kohler, MD Electrophysiologist: None  Discussed the use of AI scribe software for clinical note transcription with the patient, who gave verbal consent to proceed.   Patient Profile    Chief Complaint: 1 year ICD follow-up  History of Present Illness: Paul Bullock is a 78 y.o. male with PMH notable for CAD s/p PCI c/b cardiac arrest, s/p ICD, HFrEF, HTN, COPD, T2DM; seen today for None for routine electrophysiology followup.   I last saw him 11/2022 where he was doing well. He most recently saw Dr. Mitzie Anda 07/2023 where he was euvolemic, having mild dyspnea with walking long distances.  On 11/2023 remote device transmission, he had 40 beat NSVT.   On follow-up today, he did not eat breakfast or lunch today prior to appt, was planning to eat after appt. He denies dizziness, LH, presyncope.  He continues to overall feel well, rarely uses PRN torsemide . He denies chest pain, chest pressure, palpitations.     Arrhythmia/Device History MDT dual chamber ICD, imp 11/2014; dx ICM    ROS:  Please see the history of present illness. All other systems are reviewed and otherwise negative.    Physical Exam    VS:  BP (!) 79/48 (BP Location: Left Arm, Patient Position: Sitting, Cuff Size: Normal)   Pulse 71   Ht 5\' 10"  (1.778 m)   Wt 261 lb 6.4 oz (118.6 kg)   SpO2 93%   BMI 37.51 kg/m  BMI: Body mass index is 37.51 kg/m.  Wt Readings from Last 3 Encounters:  01/01/24 261 lb 6.4 oz (118.6 kg)  12/25/23 266 lb 9.6 oz (120.9 kg)  07/17/23 268 lb (121.6 kg)     GEN- The patient is well appearing, alert and oriented x 3 today.   Lungs- Clear to ausculation bilaterally, normal work of breathing.  Heart- Regular rate and rhythm, no murmurs, rubs or gallops Extremities- Trace peripheral edema, warm, dry Skin-  device pocket  well-healed, no tethering   Device interrogation done today and reviewed by myself:  Battery 10 months Lead thresholds, impedence, sensing stable  Frequent NSVT episodes, usual less than 5 seconds No changes made today  Added VT monitor zone at 182bpm   Studies Reviewed   Previous EP, cardiology notes.    EKG is ordered. Personal review of EKG from today shows:    EKG Interpretation Date/Time:  Wednesday Jan 01 2024 13:55:11 EDT Ventricular Rate:  71 PR Interval:  198 QRS Duration:  128 QT Interval:  408 QTC Calculation: 443 R Axis:   -66  Text Interpretation: Normal sinus rhythm Left axis deviation Non-specific intra-ventricular conduction block Confirmed by [name] [phone]) on 01/01/2024 2:03:27 PM     TTE, 07/11/2023  1. Left ventricular ejection fraction, by estimation, is 25 to 30%. Left ventricular ejection fraction by 2D MOD biplane is 24.7 %. The left ventricle has severely decreased function. The left ventricle demonstrates global hypokinesis. The left ventricular internal cavity size was mildly dilated. Left ventricular diastolic parameters are consistent with Grade I diastolic dysfunction (impaired relaxation). The average left ventricular global longitudinal strain is -3.9%.   2. Right ventricular systolic function is normal. The right ventricular size is normal. There is normal pulmonary artery systolic pressure. The estimated right ventricular systolic pressure is 14.7 mmHg.   3. The mitral valve is normal in  structure. No evidence of mitral valve regurgitation. No evidence of mitral stenosis.   4. The aortic valve is normal in structure. Aortic valve regurgitation is not visualized. No aortic stenosis is present.   5. The inferior vena cava is normal in size with greater than 50% respiratory variability, suggesting right atrial pressure of 3 mmHg.   TTE, 05/28/2022  1. Left ventricular ejection fraction, by estimation, is 30 to 35%. The left ventricle has  moderate to severely decreased function. The left ventricle demonstrates global hypokinesis. There is mild left ventricular hypertrophy. Left ventricular diastolic parameters are consistent with Grade II diastolic dysfunction  (pseudonormalization).   2. Right ventricular systolic function is low normal. The right ventricular size is normal.   3. The mitral valve is normal in structure. No evidence of mitral valve regurgitation.   4. The aortic valve was not well visualized. Aortic valve regurgitation is mild.   5. The inferior vena cava is normal in size with greater than 50% respiratory variability, suggesting right atrial pressure of 3 mmHg.   Comparison(s): Echocardiogram done 10/13/21 showed an EF of 20-25%.    Assessment and Plan     #) ICM #) ICD in situ #) NSVT Having asymptomatic NSVT episodes Device functioning well, added slow VT zone Battery about 10 months  #) HFrEF BP low today in office, but patient asymptomatic Per home readings, his readings are 90-110 systolic Follows regularly with Dr. Mitzie Anda, due for follow-up and will help facilitate Continue 10mg  bisprolol, 10mg  jardiance , 97/103 entresto , 25mg  spiro Continue 10mg  torsemide  PRN, needing rarely      Current medicines are reviewed at length with the patient today.   The patient does not have concerns regarding his medicines.  The following changes were made today:  none  Labs/ tests ordered today include:  Orders Placed This Encounter  Procedures   EKG 12-Lead     Disposition: Follow up with Dr. Daneil Dunker  in 6 months to establish with new MD and discuss gen change   Signed, Adaline Holly, NP  01/01/24  3:02 PM  Electrophysiology [facility]

## 2024-01-01 NOTE — Progress Notes (Signed)
 Remote ICD transmission.

## 2024-01-01 NOTE — Telephone Encounter (Signed)
 Called to confirm/remind patient of their appointment at the [facility] on 01/02/24.   Appointment:   [] Confirmed  [] Left mess   [x] No answer/No voice mail  [] VM Full/unable to leave message  [] Phone not in service  Patient reminded to bring all medications and/or complete list.  Confirmed patient has transportation. Gave directions, instructed to utilize valet parking.

## 2024-01-01 NOTE — Patient Instructions (Addendum)
 Medication Instructions:  Your physician recommends that you continue on your current medications as directed. Please refer to the Current Medication list given to you today.  *If you need a refill on your cardiac medications before your next appointment, please call your pharmacy*  Lab Work: No labs ordered today  If you have labs (blood work) drawn today and your tests are completely normal, you will receive your results only by: MyChart Message (if you have MyChart) OR A paper copy in the mail If you have any lab test that is abnormal or we need to change your treatment, we will call you to review the results.  Testing/Procedures: No test ordered today   Follow-Up: At [facility], you and your health needs are our priority.  As part of our continuing mission to provide you with exceptional heart care, our providers are all part of one team.  This team includes your primary Cardiologist (physician) and Advanced Practice Providers or APPs (Physician Assistants and Nurse Practitioners) who all work together to provide you with the care you need, when you need it.  Your next appointment:   1 year(s)  Provider:   You may see Dr Ardeen Kohler or one of the following Advanced Practice Providers on your designated Care Team:   Mertha Abrahams, [place] [address]" [place], PA-C Suzann Riddle, NP Creighton Doffing, NP      Other Instructions Needs appointment with Dr. Mitzie Anda next available

## 2024-01-01 NOTE — Telephone Encounter (Signed)
 This patient GI Referral was already sent to the [facility] in [place] as he requested.  Copied from CRM [id number]. Topic: Referral - Question >> Dec 31, 2023  4:22 PM Adrianna P wrote: Reason for CRM: Patient needs a referral for colonoscopy.

## 2024-01-01 NOTE — Addendum Note (Signed)
 Addended by: Lott Rouleau A on: 01/01/2024 03:11 PM   Modules accepted: Orders

## 2024-01-02 ENCOUNTER — Telehealth (HOSPITAL_COMMUNITY): Payer: Self-pay | Admitting: Pharmacy Technician

## 2024-01-02 ENCOUNTER — Encounter: Payer: Self-pay | Admitting: Cardiology

## 2024-01-02 ENCOUNTER — Other Ambulatory Visit (HOSPITAL_COMMUNITY): Payer: Self-pay

## 2024-01-02 ENCOUNTER — Telehealth: Payer: Self-pay | Admitting: Pharmacist

## 2024-01-02 ENCOUNTER — Ambulatory Visit: Attending: Cardiology | Admitting: Cardiology

## 2024-01-02 VITALS — BP 106/57 | HR 83 | Wt 266.2 lb

## 2024-01-02 DIAGNOSIS — Z6838 Body mass index (BMI) 38.0-38.9, adult: Secondary | ICD-10-CM | POA: Insufficient documentation

## 2024-01-02 DIAGNOSIS — I2510 Atherosclerotic heart disease of native coronary artery without angina pectoris: Secondary | ICD-10-CM | POA: Insufficient documentation

## 2024-01-02 DIAGNOSIS — Z8551 Personal history of malignant neoplasm of bladder: Secondary | ICD-10-CM | POA: Insufficient documentation

## 2024-01-02 DIAGNOSIS — Z9221 Personal history of antineoplastic chemotherapy: Secondary | ICD-10-CM | POA: Diagnosis not present

## 2024-01-02 DIAGNOSIS — Z955 Presence of coronary angioplasty implant and graft: Secondary | ICD-10-CM | POA: Insufficient documentation

## 2024-01-02 DIAGNOSIS — Z7901 Long term (current) use of anticoagulants: Secondary | ICD-10-CM | POA: Insufficient documentation

## 2024-01-02 DIAGNOSIS — D751 Secondary polycythemia: Secondary | ICD-10-CM | POA: Insufficient documentation

## 2024-01-02 DIAGNOSIS — I5022 Chronic systolic (congestive) heart failure: Secondary | ICD-10-CM | POA: Diagnosis present

## 2024-01-02 DIAGNOSIS — I255 Ischemic cardiomyopathy: Secondary | ICD-10-CM | POA: Insufficient documentation

## 2024-01-02 DIAGNOSIS — F17200 Nicotine dependence, unspecified, uncomplicated: Secondary | ICD-10-CM | POA: Insufficient documentation

## 2024-01-02 DIAGNOSIS — E669 Obesity, unspecified: Secondary | ICD-10-CM | POA: Diagnosis not present

## 2024-01-02 DIAGNOSIS — D696 Thrombocytopenia, unspecified: Secondary | ICD-10-CM | POA: Insufficient documentation

## 2024-01-02 DIAGNOSIS — Z95810 Presence of automatic (implantable) cardiac defibrillator: Secondary | ICD-10-CM | POA: Diagnosis not present

## 2024-01-02 DIAGNOSIS — Z8674 Personal history of sudden cardiac arrest: Secondary | ICD-10-CM | POA: Insufficient documentation

## 2024-01-02 DIAGNOSIS — R220 Localized swelling, mass and lump, head: Secondary | ICD-10-CM | POA: Diagnosis not present

## 2024-01-02 DIAGNOSIS — Z86718 Personal history of other venous thrombosis and embolism: Secondary | ICD-10-CM | POA: Diagnosis not present

## 2024-01-02 DIAGNOSIS — Z79899 Other long term (current) drug therapy: Secondary | ICD-10-CM | POA: Diagnosis not present

## 2024-01-02 DIAGNOSIS — Z7951 Long term (current) use of inhaled steroids: Secondary | ICD-10-CM | POA: Diagnosis not present

## 2024-01-02 DIAGNOSIS — Z86711 Personal history of pulmonary embolism: Secondary | ICD-10-CM | POA: Insufficient documentation

## 2024-01-02 DIAGNOSIS — I472 Ventricular tachycardia, unspecified: Secondary | ICD-10-CM | POA: Diagnosis not present

## 2024-01-02 DIAGNOSIS — I252 Old myocardial infarction: Secondary | ICD-10-CM | POA: Diagnosis not present

## 2024-01-02 DIAGNOSIS — Z923 Personal history of irradiation: Secondary | ICD-10-CM | POA: Insufficient documentation

## 2024-01-02 DIAGNOSIS — J449 Chronic obstructive pulmonary disease, unspecified: Secondary | ICD-10-CM | POA: Insufficient documentation

## 2024-01-02 MED ORDER — VARENICLINE TARTRATE 1 MG PO TABS: ORAL_TABLET | ORAL | 3 refills | Status: DC

## 2024-01-02 MED ORDER — VARENICLINE TARTRATE (STARTER) 0.5 MG X 11 & 1 MG X 42 PO TBPK: ORAL_TABLET | ORAL | 0 refills | Status: DC

## 2024-01-02 NOTE — Progress Notes (Signed)
 PCP: Driscilla George, MD Cardiology: Dr. Mitzie Anda  Chief complaint: CHF  78 y.o. with history of COPD, DVT/PE, ischemic cardiomyopathy, and CAD presents for followup of CHF.  Patient suffered a myocardial infarction complicated by cardiac arrest in 2002 and required stenting of the obtuse marginal.  He notes that he subsequently had LV dysfunction.  In 2013, he required repeat diagnostic catheterization which revealed moderate disease distal to previously placed obtuse marginal stent with a 75% stenosis in the mid LAD with aneurysmal post stenotic dilation.  Per notes, he was medically managed.  In the setting of LV dysfunction with an EF as low as 20%, he underwent Medtronic AICD placement in April 2016, and up until 2022, he was followed closely by cardiology in [place], [place] .  He did require admission to a hospital in [place]  in June 2002, in the setting of COPD exacerbation while visiting his son.  Per discharge summary, he was noted to have mild troponin elevation which was felt to be secondary to demand ischemia.   Paul Bullock  in December 2022.  His ex-wife lives nearby and they are close friends.  On October 12 2021, he developed recurrent chest discomfort and was admitted to [facility] with NSTEMI.  He underwent diagnostic catheterization at [facility] revealing severe proximal to mid LAD disease with otherwise nonobstructive disease and patent obtuse marginal stent.  His LVEDP was approximately 40 mmHg and ventriculogram showed an EF of less than 25%.  He was given Lasix  in the lab and after discussion with advanced heart failure team at [facility], he was transferred for further evaluation and potential inotropic support.  At [facility], he was diuresed and started on GDMT.  He had DES x 2 to proximal and mid LAD and was discharged home.   Echo in 10/23 showed EF 30-35%, low normal RV function.  Echo in 12/24 showed EF 25-30%, normal RV, no MR, normal IVC.   In  4/25, patient a 40 beat run of VT, not treated by device.  He did not feel it.   Patient was seen in the EP office yesterday, BP was noted to be 79/48.  He denies lightheadedness.  He had not eaten or drunk anything that day.  Today, BP 106/57 and he says that SBP runs 110s-120s when he checks at home (checks BP most days).  He has had no dizzy spells or syncope. Rare palpitations.  No chest pain.  Mild dyspnea if he walks through multiple stores but generally no dyspnea walking on flat ground.  He does fine in the grocery store and no problems walking up stsairs.  Weight is down 2 lbs.   Labs (3/23): K 4.6, creatinine 1.02, LDL 59, hgb 17.7, plts 98K Labs (5/23): K 4.6, creatinine 1.44, LDL 56, JAK2 mutation negative, hgb 17.9, plts 123 Labs (2/24): K 4.6, creatinine 1.27 Labs (6/24): K 4.6, creatinine 1.4, BNP 108, LDL 81 Labs (9/24): K 4.2, creatinine 1.16, BNP 186, LDL 32, TGs 178 Labs (5/25): K 4.6, creatinine 1.21, LDL 49, hgb 19.7  ECG (personally reviewed): NSR, left axis deviation, IVCD 128 msec  PMH: 1. COPD: Still smoking 2. DVT/PE 3. Chronic systolic CHF: Ischemic cardiomyopathy.  Medtronic ICD from 4/16.  - Echo (3/23): EF 20-25% - RHC (3/23): mean RA 25, PA 62/47, mean PCWP 39, CI 2.03.  - Echo (10/23): EF 30-35%, low normal RV function.  - Echo (12/24): EF 25-30%, normal RV, no MR, normal IVC. 4. CAD: MI with OM2  stent in 2002.  - NSTEMI 3/23 with DES x 2 to proximal and mid LAD.  5. 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  6. Bladder cancer 2012: Chemoradiation.  7. Type 2 DM 8. Polycythemia: Likely due to chronic smoking, JAK2 mutation negative.  9. 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  Quit smoking in 6/23 then restarted.  No ETOH.   Family History  Problem Relation Age of Onset   Stroke Mother    Bladder Cancer Father    Heart attack Brother    ROS: All systems reviewed and negative except as per HPI.    Current Outpatient Medications  Medication Sig Dispense Refill   albuterol  (PROVENTIL ) (2.5 MG/3ML) 0.083% nebulizer solution Take 3 mLs (2.5 mg total) by nebulization every 6 (six) hours as needed for wheezing or shortness of breath. 540 mL 3   bisoprolol  (ZEBETA ) 10 MG tablet TAKE 1 TABLET DAILY (CHANGE IN DOSAGE OR PILL SIZE) 90 tablet 3   Cholecalciferol  (VITAMIN D -3) 25 MCG (1000 UT) CAPS Take 1 capsule by mouth daily.     ELIQUIS  5 MG TABS tablet TAKE 1 TABLET TWICE A DAY (CHANGE IN DOSE OR TABLET SIZE) 180 tablet 3   empagliflozin  (JARDIANCE ) 10 MG TABS tablet Take 1 tablet (10 mg total) by mouth daily. 90 tablet 3   ezetimibe  (ZETIA ) 10 MG tablet TAKE 1 TABLET DAILY 90 tablet 3   fluticasone  furoate-vilanterol (BREO ELLIPTA ) 100-25 MCG/ACT AEPB Inhale 1 puff into the lungs daily. 90 each 3   Multiple Vitamins-Minerals (MULTIVITAMIN WITH MINERALS) tablet Take 1 tablet by mouth daily.     nystatin  (MYCOSTATIN ) 100000 UNIT/ML suspension Take 2 mLs (200,000 Units total) by mouth 4 (four) times daily. 240 mL 0   Pitavastatin  Calcium  4 MG TABS TAKE 1 TABLET DAILY 90 tablet 3   sacubitril -valsartan  (ENTRESTO ) 97-103 MG Take 1 tablet by mouth 2 (two) times daily. 180 tablet 3   spironolactone  (ALDACTONE ) 25 MG tablet Take 1 tablet (25 mg total) by mouth daily. 90 tablet 3   torsemide  (DEMADEX ) 20 MG tablet Take 0.5 tablets (10 mg total) by mouth as needed. Take PRN for weight gain/edema. (3lbs in 1 day or 5 lbs in 1 week)     varenicline  (CHANTIX ) 1 MG tablet Take 0.5 mg by mouth daily x 3 days, then 0.5 mg twice daily x 4 days, then 1 mg twice daily after 56 tablet 3   No current facility-administered medications for this visit.   BP (!) 106/57   Pulse 83   Wt 266 lb 3.2 oz (120.7 kg)   SpO2 94%   BMI 38.20 kg/m  General: NAD Neck: Thick. No JVD, no thyromegaly or thyroid nodule.  Lungs: Clear to auscultation bilaterally with normal respiratory effort. CV: Nondisplaced PMI.  Heart  regular S1/S2, no S3/S4, no murmur.  1+ ankle edema.  No carotid bruit.  Normal pedal pulses.  Abdomen: Soft, nontender, no hepatosplenomegaly, no distention.  Skin: Intact without lesions or rashes.  Neurologic: Alert and oriented x 3.  Psych: Normal affect. Extremities: No clubbing or cyanosis.  HEENT: small mass noted at angle of jaw on right.   Assessment/Plan: 1.  CAD: Patient has history of CAD with prior PCI to OM in 2002.  NSTEMI in 3/23 with cath showing complex proximal LAD disease with serial 90% stenoses separated by small aneurysmal areas.  He had a 60% mid LCx stenosis.  RCA with mild disease.  He had DES x 2  to proximal and mid LAD. No chest pain.  - No ASA given stable CAD with Eliquis  use.  - Continue Livalo  and Zetia , good lipids in 5/25.   2. Chronic systolic CHF: Medtronic ICD.  Ischemic cardiomyopathy.  Echo in 3/23 with EF 20-25%, severe LV dilation, RV mildly dysfunctional.  RHC in 3/23 with markedly elevated filling pressures, CI 2.03, and PAPI < 1. Echo in 10/23 with EF 30-35%, low normal RV function.  Echo in 12/24 showed EF 25-30%, normal RV, no MR, normal IVC.  NYHA class II, he is not volume overloaded.  BP is stable and has been good at home with no lightheadedness.  I suspect the low BP reading at EP office was an aberration.  - Continue torsemide  prn.   - Continue bisoprolol  10 mg daily (beta-1 selective with COPD).  - Continue Entresto  97/103 bid.  Recent BMET was stable.     - Continue empagliflozin  10 mg daily.  - Continue spironolactone  25 mg daily.  - QRS does not appear wide enough to benefit from CRT upgrade.   - He would be a reasonable candidate for barostimulation activation therapy or CCM if symptoms worsen.  3. 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   - Continue apixaban .  4. COPD: He continues to smoke. Willing to try Chantix  if we can get some form of assistance for him.  We will work on this.    5. Thrombocytopenia: Mild, chronic.  6. Bladder  cancer: Has been followed by cystoscopies   7. Polycythemia: JAK2 mutation negative, likely due to chronic smoking.  Hgb up to 19.7 recently.  - scheduled for phlebotomy by hematology.  8. NSVT: No changes for now, if he has further ventricular arrhythmias may need to start amiodarone.  - Continue bisoprolol .  9. Obesity: I will see if we can get tirzepatide or semaglutide for him.   Followup in 4 months.   I spent 31 minutes reviewing records, interviewing/examining patient, and managing orders.   Paul Bullock 01/02/2024

## 2024-01-02 NOTE — Telephone Encounter (Signed)
 Per Dr. Mitzie Anda, patient will be started on Chantix . [facility] called and a good Rx card was provided. The patient will start today with anticipated stop date in 1 week.

## 2024-01-02 NOTE — Patient Instructions (Signed)
 Follow-Up in: 3 MONTHS WITH Shawnee Dellen, FNP.  Our Doctors' schedules are NOT open yet for 3 months. We will place you on our recall list. Once they are available, we will call you to schedule your follow up appointment.   At the [facility], you and your health needs are our priority. We have a designated team specialized in the treatment of Heart Failure. This Care Team includes your primary Heart Failure Specialized Cardiologist (physician), Advanced Practice Providers (APPs- Physician Assistants and Nurse Practitioners), and Pharmacist who all work together to provide you with the care you need, when you need it.   You may see any of the following providers on your designated Care Team at your next follow up:  Dr. Jules Oar Dr. Peder Bourdon Dr. Alwin Baars Dr. Judyth Nunnery Shawnee Dellen, FNP Bevely Brush, RPH-CPP  Please be sure to bring in all your medications bottles to every appointment.   Need to Contact Us :  If you have any questions or concerns before your next appointment please send us  a message through [place] or call our office at [phone].    TO LEAVE A MESSAGE FOR THE NURSE SELECT OPTION 2, PLEASE LEAVE A MESSAGE INCLUDING: YOUR NAME DATE OF BIRTH CALL BACK NUMBER REASON FOR CALL**this is important as we prioritize the call backs  YOU WILL RECEIVE A CALL BACK THE SAME DAY AS LONG AS YOU CALL BEFORE 4:00 PM

## 2024-01-02 NOTE — Telephone Encounter (Signed)
 Pharmacy Patient Advocate Encounter  Insurance verification completed.    The patient is insured through [employer].     Ran test claim for CHANTIX  AND VARENICLINE  Tablets and neither of those are covered.  Ran test claim for MOUNJARO 2.5MG /0.5ML PENS and a prior authorization is required.  Ran test claim for WEGOVY 0.25MG /0.5ML PENS and a prior authorization is required.  Ran test claim for ZEPBOUND 2.5MG /0.5ML PENS and a prior authorization is required.  Ran test claim for OZEMPIC 0.25MG  OR 0.5MG /DOSE  PENS and a prior authorization is required.   This test claim was processed through [facility]- copay amounts may vary at other pharmacies due to pharmacy/plan contracts, or as the patient moves through the different stages of their insurance plan.

## 2024-01-07 ENCOUNTER — Ambulatory Visit
Admission: RE | Admit: 2024-01-07 | Discharge: 2024-01-07 | Disposition: A | Discharge status: Home / Self Care | Source: Ambulatory Visit | Attending: Internal Medicine | Admitting: Internal Medicine

## 2024-01-07 ENCOUNTER — Other Ambulatory Visit: Payer: Self-pay | Admitting: *Deleted

## 2024-01-07 DIAGNOSIS — R600 Localized edema: Secondary | ICD-10-CM | POA: Insufficient documentation

## 2024-01-07 DIAGNOSIS — R718 Other abnormality of red blood cells: Secondary | ICD-10-CM

## 2024-01-07 DIAGNOSIS — D751 Secondary polycythemia: Secondary | ICD-10-CM

## 2024-01-07 MED ORDER — IOHEXOL 300 MG/ML  SOLN
75.0000 mL | Freq: Once | INTRAMUSCULAR | Status: AC | PRN
  Administered 2024-01-07: 75 mL via INTRAVENOUS

## 2024-01-08 ENCOUNTER — Encounter: Payer: Self-pay | Admitting: Oncology

## 2024-01-08 ENCOUNTER — Inpatient Hospital Stay

## 2024-01-08 ENCOUNTER — Inpatient Hospital Stay (HOSPITAL_BASED_OUTPATIENT_CLINIC_OR_DEPARTMENT_OTHER): Admitting: Oncology

## 2024-01-08 ENCOUNTER — Inpatient Hospital Stay: Attending: Oncology

## 2024-01-08 VITALS — BP 95/59 | HR 80 | Temp 96.7°F | Resp 16 | Wt 264.0 lb

## 2024-01-08 VITALS — BP 99/50 | HR 64 | Temp 97.1°F | Resp 16

## 2024-01-08 DIAGNOSIS — D696 Thrombocytopenia, unspecified: Secondary | ICD-10-CM | POA: Insufficient documentation

## 2024-01-08 DIAGNOSIS — F17210 Nicotine dependence, cigarettes, uncomplicated: Secondary | ICD-10-CM | POA: Insufficient documentation

## 2024-01-08 DIAGNOSIS — D751 Secondary polycythemia: Secondary | ICD-10-CM

## 2024-01-08 DIAGNOSIS — R718 Other abnormality of red blood cells: Secondary | ICD-10-CM

## 2024-01-08 LAB — CBC WITH DIFFERENTIAL/PLATELET
Abs Immature Granulocytes: 0.03 10*3/uL (ref 0.00–0.07)
Basophils Absolute: 0 10*3/uL (ref 0.0–0.1)
Basophils Relative: 1 %
Eosinophils Absolute: 0.5 10*3/uL (ref 0.0–0.5)
Eosinophils Relative: 7 %
HCT: 54 % — ABNORMAL HIGH (ref 39.0–52.0)
Hemoglobin: 18.1 g/dL — ABNORMAL HIGH (ref 13.0–17.0)
Immature Granulocytes: 0 %
Lymphocytes Relative: 14 %
Lymphs Abs: 1 10*3/uL (ref 0.7–4.0)
MCH: 32.1 pg (ref 26.0–34.0)
MCHC: 33.5 g/dL (ref 30.0–36.0)
MCV: 95.7 fL (ref 80.0–100.0)
Monocytes Absolute: 0.5 10*3/uL (ref 0.1–1.0)
Monocytes Relative: 7 %
Neutro Abs: 5.2 10*3/uL (ref 1.7–7.7)
Neutrophils Relative %: 71 %
Platelets: 111 10*3/uL — ABNORMAL LOW (ref 150–400)
RBC: 5.64 MIL/uL (ref 4.22–5.81)
RDW: 12.6 % (ref 11.5–15.5)
WBC: 7.2 10*3/uL (ref 4.0–10.5)
nRBC: 0 % (ref 0.0–0.2)

## 2024-01-08 LAB — FERRITIN: Ferritin: 54 ng/mL (ref 24–336)

## 2024-01-08 LAB — IRON AND TIBC
Iron: 124 ug/dL (ref 45–182)
Saturation Ratios: 39 % (ref 17.9–39.5)
TIBC: 322 ug/dL (ref 250–450)
UIBC: 198 ug/dL

## 2024-01-08 NOTE — Progress Notes (Signed)
 [facility]  [phone] Fax:([phone]) [phone]  ID: Paul Bullock OB: [birth year]  MR#: [id number]  QIO#:[id number]  Patient Care Team: Driscilla George, MD as PCP - General (Internal Medicine) Ardeen Kohler, MD as PCP - Cardiology (Cardiology) Shellie Dials, MD as Consulting Physician (Oncology)   CHIEF COMPLAINT: Polycythemia.  INTERVAL HISTORY: Patient last evaluated in clinic in June 2023.  He was referred back for increasing hemoglobin and consideration of phlebotomy.  He continues to feel well remains asymptomatic.  Patient continues to smoke. He has no neurologic complaints.  He denies any recent fevers or illnesses.  He has a good appetite and denies weight loss.  He has no chest pain, shortness of breath, cough, or hemoptysis.  He denies any nausea, vomiting, constipation, diarrhea.  He has no urinary complaints.  Patient offers no specific complaints today.  REVIEW OF SYSTEMS:   Review of Systems  Constitutional: Negative.  Negative for fever, malaise/fatigue and weight loss.  Respiratory: Negative.  Negative for cough and shortness of breath.   Cardiovascular: Negative.  Negative for chest pain and leg swelling.  Gastrointestinal: Negative.  Negative for abdominal pain.  Genitourinary: Negative.  Negative for dysuria.  Musculoskeletal: Negative.  Negative for back pain.  Skin: Negative.  Negative for rash.  Neurological: Negative.  Negative for dizziness, focal weakness, weakness and headaches.  Psychiatric/Behavioral: Negative.  The patient is not nervous/anxious.     As per HPI. Otherwise, a complete review of systems is negative.  PAST MEDICAL HISTORY: Past Medical History:  Diagnosis Date   AICD (automatic cardioverter/defibrillator) present    a. 11/2014 s/p MDT DDMB 1D4 [facility] MRI XT DR AICD (ser# [id number]).   Bladder cancer (HCC)    a. 2013 s/p chemo/xrt.   CAD (coronary artery disease)    a. 11/2020 s/p MI/cardiac arrest-->DES to  LCX x 1; b. 10/2011 Cath: LAD 62m w/ aneurysmal post-stenotic dilation-->med rx. LCX 50d to prior stent; c. 10/2021 NSTEMI/Cath: LM 30ost/m, LAD 90p/m, 59m, LCX 20p/m, patent stent, LPAV 60, RCA mild diff dzs, EF<25%, LVEDP .   Chronic HFrEF (heart failure with reduced ejection fraction) (HCC)    a. EF prev as low as 20% w/ subsequent improvement to 50-55%; b. 11/2020 limited echo: "windows were challenging...LVEF largely preserved."; c. 10/2021 LV gram: EF <25%, LVEDP .   COPD (chronic obstructive pulmonary disease) (HCC)    Depression    Diverticulitis    DVT (deep venous thrombosis) (HCC)    Essential hypertension    Hyperlipidemia LDL goal <70    Ischemic cardiomyopathy    a. EF prev as low as 20%; b. 11/2014 s/p MDT [id number] [facility] MRI XT DR AICD; c. subsequent improvement in EF to 50-55%; d. 11/2020 limited echo: "windows were challenging...LVEF largely preserved."; e. 10/2021 LV Gram: EF <25%.   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   Thrombocytopenia (HCC)    Tobacco abuse    Varicose veins of left lower extremity     PAST SURGICAL HISTORY: Past Surgical History:  Procedure Laterality Date   CARDIAC DEFIBRILLATOR PLACEMENT     CORONARY STENT INTERVENTION N/A 10/18/2021   Procedure: CORONARY STENT INTERVENTION;  Surgeon: Wenona Hamilton, MD;  Location: [facility] INVASIVE CV LAB;  Service: Cardiovascular;  Laterality: N/A;  Prox and Mid LAD   MOHS SURGERY     PERCUTANEOUS CORONARY STENT INTERVENTION (PCI-S)  RIGHT/LEFT HEART CATH AND CORONARY ANGIOGRAPHY N/A 10/13/2021   Procedure: RIGHT/LEFT HEART CATH AND CORONARY ANGIOGRAPHY;  Surgeon: Wenona Hamilton, MD;  Location: [facility] INVASIVE CV LAB;  Service: Cardiovascular;  Laterality: N/A;   VASECTOMY     VASECTOMY REVERSAL      FAMILY HISTORY: Family History  Problem Relation Age of Onset   Stroke Mother    Bladder Cancer  Father    Heart attack Brother     ADVANCED DIRECTIVES (Y/N):  N  HEALTH MAINTENANCE: Social History   Tobacco Use   Smoking status: Every Day    Current packs/day: 0.75    Average packs/day: 0.8 packs/day for 55.0 years (41.3 ttl pk-yrs)    Types: Cigarettes   Smokeless tobacco: Never   Tobacco comments:    10 cigs per day  Vaping Use   Vaping status: Never Used  Substance Use Topics   Alcohol use: Not Currently    Comment: seldom   Drug use: Never     Colonoscopy:  PAP:  Bone density:  Lipid panel:  Allergies  Allergen Reactions   Atorvastatin Other (See Comments)    Myalgias   Rosuvastatin  Other (See Comments)    myalgias    Current Outpatient Medications  Medication Sig Dispense Refill   albuterol  (PROVENTIL ) (2.5 MG/3ML) 0.083% nebulizer solution Take 3 mLs (2.5 mg total) by nebulization every 6 (six) hours as needed for wheezing or shortness of breath. 540 mL 3   bisoprolol  (ZEBETA ) 10 MG tablet TAKE 1 TABLET DAILY (CHANGE IN DOSAGE OR PILL SIZE) 90 tablet 3   Cholecalciferol  (VITAMIN D -3) 25 MCG (1000 UT) CAPS Take 1 capsule by mouth daily.     ELIQUIS  5 MG TABS tablet TAKE 1 TABLET TWICE A DAY (CHANGE IN DOSE OR TABLET SIZE) 180 tablet 3   empagliflozin  (JARDIANCE ) 10 MG TABS tablet Take 1 tablet (10 mg total) by mouth daily. 90 tablet 3   ezetimibe  (ZETIA ) 10 MG tablet TAKE 1 TABLET DAILY 90 tablet 3   fluticasone  furoate-vilanterol (BREO ELLIPTA ) 100-25 MCG/ACT AEPB Inhale 1 puff into the lungs daily. 90 each 3   Multiple Vitamins-Minerals (MULTIVITAMIN WITH MINERALS) tablet Take 1 tablet by mouth daily.     nystatin  (MYCOSTATIN ) 100000 UNIT/ML suspension Take 2 mLs (200,000 Units total) by mouth 4 (four) times daily. 240 mL 0   Pitavastatin  Calcium  4 MG TABS TAKE 1 TABLET DAILY 90 tablet 3   sacubitril -valsartan  (ENTRESTO ) 97-103 MG Take 1 tablet by mouth 2 (two) times daily. 180 tablet 3   spironolactone  (ALDACTONE ) 25 MG tablet Take 1 tablet (25 mg  total) by mouth daily. 90 tablet 3   torsemide  (DEMADEX ) 20 MG tablet Take 0.5 tablets (10 mg total) by mouth as needed. Take PRN for weight gain/edema. (3lbs in 1 day or 5 lbs in 1 week)     varenicline  (CHANTIX ) 1 MG tablet Take 0.5 mg by mouth daily x 3 days, then 0.5 mg twice daily x 4 days, then 1 mg twice daily after 56 tablet 3   No current facility-administered medications for this visit.    OBJECTIVE: Vitals:   01/08/24 0928  BP: (!) 95/59  Pulse: 80  Resp: 16  Temp: (!) 96.7 F (35.9 C)  SpO2: 93%      Body mass index is 37.88 kg/m.    ECOG FS:0 - Asymptomatic  General: Well-developed, well-nourished, no acute distress. Eyes: Pink conjunctiva, anicteric sclera. HEENT: Normocephalic, moist mucous membranes. Lungs: No audible wheezing or coughing. Heart:  Regular rate and rhythm. Abdomen: Soft, nontender, no obvious distention. Musculoskeletal: No edema, cyanosis, or clubbing. Neuro: Alert, answering all questions appropriately. Cranial nerves grossly intact. Skin: No rashes or petechiae noted. Psych: Normal affect.  LAB RESULTS:  Lab Results  Component Value Date   NA 142 12/25/2023   K 4.6 12/25/2023   CL 104 12/25/2023   CO2 18 (L) 12/25/2023   GLUCOSE 111 (H) 12/25/2023   BUN 18 12/25/2023   CREATININE 1.21 12/25/2023   CALCIUM  9.9 12/25/2023   PROT 6.6 12/25/2023   ALBUMIN 4.2 12/25/2023   AST 19 12/25/2023   ALT 20 12/25/2023   ALKPHOS 74 12/25/2023   BILITOT 1.6 (H) 12/25/2023   GFRNONAA >60 04/29/2023    Lab Results  Component Value Date   WBC 7.2 01/08/2024   NEUTROABS 5.2 01/08/2024   HGB 18.1 (H) 01/08/2024   HCT 54.0 (H) 01/08/2024   MCV 95.7 01/08/2024   PLT 111 (L) 01/08/2024     STUDIES: CT MAXILLOFACIAL W CONTRAST Result Date: 01/07/2024 CLINICAL DATA:  Swelling of the right parotid gland.  Fixed mass. EXAM: CT MAXILLOFACIAL WITH CONTRAST TECHNIQUE: Multidetector CT imaging of the maxillofacial structures was performed with  intravenous contrast. Multiplanar CT image reconstructions were also generated. RADIATION DOSE REDUCTION: This exam was performed according to the departmental dose-optimization program which includes automated exposure control, adjustment of the mA and/or kV according to patient size and/or use of iterative reconstruction technique. CONTRAST:  75mL OMNIPAQUE  IOHEXOL  300 MG/ML  SOLN COMPARISON:  None Available. FINDINGS: Osseous: No regional bone abnormality. Orbits: Normal Sinuses: Mild polypoid mucosal thickening of the maxillary sinuses without advanced sinusitis. Other sinuses are clear. Soft tissues: Left parotid gland is normal. Submandibular glands are normal. Within the inferior aspect of the right parotid gland, primarily in the superficial lobe but with some extension to the medial deep lobe, is a well-circumscribed mildly heterogeneous mass without pronounced necrosis. This measures 5 cm cephalo caudal, 4.2 cm right to left and 3 cm front to back. 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 Because of the large size of the lesion in the well-circumscribed margins, this is not likely to be aggressive. Mildly enlarged adjacent level 1 level 2 junction node on the right measuring 17 x 13 mm is at the upper limits of normal in size. No other regional lymph nodes which could be potentially abnormal by CT. Visualized intracranial structures are unremarkable. IMPRESSION: 1. 5 x 4.2 x 3 cm well-circumscribed mass in the inferior aspect of the right parotid gland, primarily in the superficial lobe but with some extension to the medial deep lobe. 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 Because of the large size of the lesion and the well-circumscribed margins, this is not likely to be aggressive. 2. Mildly enlarged adjacent level 1 level 2 junction node on the right measuring 17 x 13 mm is at  the upper limits of normal in size. No other regional lymph nodes which could be potentially abnormal by CT. Electronically Signed   By: Bettylou Brunner M.D.   On: 01/07/2024 15:53   CUP PACEART INCLINIC DEVICE CHECK Result Date: 01/01/2024 Normal in-clinic _dual__ chamber ICD check. Presenting Rhythm: _AS-VS__ . Routine testing was performed. Thresholds, sensing, and impedance demonstrate stable parameters and no programming changes needed. No treated arrhythmias. Estimated longevity __10 months__ . Pt enrolled in remote follow-up. Maylene Spear, NP   ASSESSMENT: Polycythemia  PLAN:  Polycythemia: Patient's hemoglobin remains persistently elevated 18.1.  Previously workup revealed an elevated carbon monoxide level, but the remainder of her laboratory work including JAK2 mutation was either negative or within normal limits.  Patient reports he cannot donate blood to the [employer].  Proceed with 500 mL phlebotomy today.  Return to clinic in 3 months with repeat laboratory work, further evaluation, and consideration of phlebotomy if necessary.   Thrombocytopenia: Patient's platelet count has ranged between 95 and 121 since at least March 2023.  Today's result is 111.  Monitor.  Chronic and unchanged.   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 Renal insufficiency: Patient's most recent creatinine was within normal limits. Parotid lesion: Likely benign and have recommended patient have primary care refer him to ENT for further evaluation.   Patient expressed understanding and was in agreement with this plan. He also understands that He can call clinic at any time with any questions, concerns, or complaints.     Shellie Dials, MD   01/08/2024 12:44 PM

## 2024-01-08 NOTE — Progress Notes (Signed)
 Torrence Freeze presents today for phlebotomy per MD orders. Phlebotomy procedure started at 1055 and ended at 1121. 500 mls removed. Patient tolerated procedure well. IV needle removed intact.

## 2024-01-08 NOTE — Patient Instructions (Signed)

## 2024-01-09 ENCOUNTER — Other Ambulatory Visit (HOSPITAL_COMMUNITY): Payer: Self-pay

## 2024-01-09 ENCOUNTER — Ambulatory Visit: Payer: Self-pay | Admitting: Internal Medicine

## 2024-01-09 DIAGNOSIS — K118 Other diseases of salivary glands: Secondary | ICD-10-CM

## 2024-01-13 NOTE — Telephone Encounter (Signed)
 Copied from CRM [id number]. Topic: Referral - Status >> Jan 10, 2024  9:36 AM Retta Caster wrote: Reason for CRM: Ambulatory referral to ENT (Order # [id number]) on 01/09/2024   Patient calling to update due to  PEND. Need call back ASAP on update due to PCP stated to do this within 1 week. Needs info of Location and number.  [phone]

## 2024-01-17 ENCOUNTER — Ambulatory Visit (INDEPENDENT_AMBULATORY_CARE_PROVIDER_SITE_OTHER): Admitting: Otolaryngology

## 2024-01-17 VITALS — BP 112/62 | HR 78 | Ht 70.0 in | Wt 276.0 lb

## 2024-01-17 DIAGNOSIS — F17200 Nicotine dependence, unspecified, uncomplicated: Secondary | ICD-10-CM

## 2024-01-17 DIAGNOSIS — K118 Other diseases of salivary glands: Secondary | ICD-10-CM

## 2024-01-17 DIAGNOSIS — F17210 Nicotine dependence, cigarettes, uncomplicated: Secondary | ICD-10-CM

## 2024-01-17 NOTE — Progress Notes (Signed)
 ENT CONSULT:  Reason for Consult: parotid mass    HPI: Discussed the use of AI scribe software for clinical note transcription with the patient, who gave verbal consent to proceed.  History of Present Illness Discussed the use of AI scribe software for clinical note transcription with the patient, who gave verbal consent to proceed.  History of Present Illness   Paul Bullock is a 78 year old male current smoker, hx of MI x 2, on Eliquis , who presents with a mass in the right parotid gland first noticed 2 months ago.  He discovered the mass a couple of months ago while shampooing his beard. The mass is firm and located on the right side of his face. He did not notice it initially as he had grown a beard over the last four to five months, which may have concealed it. No changes in the mass have been noted since its discovery.  No symptoms such as numbness, tingling, or facial weakness. He has not had any prior skin cancer on the head or neck. A recent scan showed a mass in the parotid gland and possibly a mildly enlarged lymph node.  His past medical history is significant for two heart attacks, and he is currently on Eliquis , a blood thinner, taken twice a day.  He smokes less than a pack a day and is currently working on smoking cessation. 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       Records Reviewed:  Onc OV on 01/08/24 CHIEF COMPLAINT: Polycythemia.   INTERVAL HISTORY: Patient last evaluated in clinic in June 2023.  He was referred back for increasing hemoglobin and consideration of phlebotomy.  He continues to feel well remains asymptomatic.  Patient continues to smoke. He has no neurologic complaints.  He denies any recent fevers or illnesses.  He has a good appetite and denies weight loss.  He has no chest pain, shortness of breath, cough, or hemoptysis.  He denies any nausea, vomiting, constipation, diarrhea.  He  has no urinary complaints.  Patient offers no specific complaints today.  Polycythemia: Patient's hemoglobin remains persistently elevated 18.1.  Previously workup revealed an elevated carbon monoxide level, but the remainder of her laboratory work including JAK2 mutation was either negative or within normal limits.  Patient reports he cannot donate blood to the [employer].  Proceed with 500 mL phlebotomy today.  Return to clinic in 3 months with repeat laboratory work, further evaluation, and consideration of phlebotomy if necessary.   Thrombocytopenia: Patient's platelet count has ranged between 95 and 121 since at least March 2023.  Today's result is 111.  Monitor.  Chronic and unchanged.   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 Renal insufficiency: Patient's most recent creatinine was within normal limits. Parotid lesion: Likely benign and have recommended patient have primary care refer him to ENT for further evaluation.      Past Medical History:  Diagnosis Date   AICD (automatic cardioverter/defibrillator) present    a. 11/2014 s/p MDT DDMB 1D4 [facility] MRI XT DR AICD (ser# [id number]).   Bladder cancer (HCC)    a. 2013 s/p chemo/xrt.   CAD (coronary artery disease)    a. 11/2020 s/p MI/cardiac arrest-->DES to LCX x 1; b. 10/2011 Cath: LAD 22m w/ aneurysmal post-stenotic dilation-->med rx. LCX 50d to prior stent; c. 10/2021 NSTEMI/Cath: LM 30ost/m, LAD 90p/m, 67m, LCX 20p/m, patent stent, LPAV 60, RCA mild  diff dzs, EF<25%, LVEDP .   Chronic HFrEF (heart failure with reduced ejection fraction) (HCC)    a. EF prev as low as 20% w/ subsequent improvement to 50-55%; b. 11/2020 limited echo: windows were challenging...LVEF largely preserved.; c. 10/2021 LV gram: EF <25%, LVEDP .   COPD (chronic obstructive pulmonary disease) (HCC)    Depression    Diverticulitis    DVT (deep venous thrombosis) (HCC)    Essential hypertension    Hyperlipidemia LDL  goal <70    Ischemic cardiomyopathy    a. EF prev as low as 20%; b. 11/2014 s/p MDT [id number] [facility] MRI XT DR AICD; c. subsequent improvement in EF to 50-55%; d. 11/2020 limited echo: windows were challenging...LVEF largely preserved.; e. 10/2021 LV Gram: EF <25%.   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   Thrombocytopenia (HCC)    Tobacco abuse    Varicose veins of left lower extremity     Past Surgical History:  Procedure Laterality Date   CARDIAC DEFIBRILLATOR PLACEMENT     CORONARY STENT INTERVENTION N/A 10/18/2021   Procedure: CORONARY STENT INTERVENTION;  Surgeon: Wenona Hamilton, MD;  Location: [facility];  Service: Cardiovascular;  Laterality: N/A;  Prox and Mid LAD   MOHS SURGERY     PERCUTANEOUS CORONARY STENT INTERVENTION (PCI-S)     RIGHT/LEFT HEART CATH AND CORONARY ANGIOGRAPHY N/A 10/13/2021   Procedure: RIGHT/LEFT HEART CATH AND CORONARY ANGIOGRAPHY;  Surgeon: Wenona Hamilton, MD;  Location: [facility] INVASIVE CV LAB;  Service: Cardiovascular;  Laterality: N/A;   VASECTOMY     VASECTOMY REVERSAL      Family History  Problem Relation Age of Onset   Stroke Mother    Bladder Cancer Father    Heart attack Brother     Social History:  reports that he has been smoking cigarettes. He has a 41.3 pack-year smoking history. He has never used smokeless tobacco. He reports that he does not currently use alcohol. He reports that he does not use drugs.  Allergies:  Allergies  Allergen Reactions   Atorvastatin Other (See Comments)    Myalgias   Rosuvastatin  Other (See Comments)    myalgias    Medications: I have reviewed the patient's current medications.  The PMH, PSH, Medications, Allergies, and SH were reviewed and updated.  ROS: Constitutional: Negative for fever, weight loss and weight gain. Cardiovascular: Negative for chest pain and dyspnea on  exertion. Respiratory: Is not experiencing shortness of breath at rest. Gastrointestinal: Negative for nausea and vomiting. Neurological: Negative for headaches. Psychiatric: The patient is not nervous/anxious  Blood pressure 112/62, pulse 78, height 5' 10 (1.778 m), weight 276 lb (125.2 kg), SpO2 92%. Body mass index is 39.6 kg/m.  PHYSICAL EXAM:  Exam: General: Well-developed, well-nourished Respiratory Respiratory effort: Equal inspiration and expiration without stridor Cardiovascular Peripheral Vascular: Warm extremities with equal color/perfusion Eyes: No nystagmus with equal extraocular motion bilaterally Neuro/Psych/Balance: Patient oriented to person, place, and time; Appropriate mood and affect; Gait is intact with no imbalance; Cranial nerves I-XII are intact Head and Face Inspection: Normocephalic and atraumatic without mass or lesion Palpation: Facial skeleton intact without bony stepoffs Salivary Glands: large palpable Right parotid mass 4-5 cm in size no overlying skin changes Facial Strength: Facial motility symmetric and full bilaterally ENT Pinna: External ear intact and fully developed External canal: Canal is patent with intact  skin Tympanic Membrane: Clear and mobile External Nose: No scar or anatomic deformity Internal Nose: Septum is relatively straight. No polyp, or purulence.  Lips, Teeth, and gums: Mucosa and teeth intact and viable TMJ: No pain to palpation with full mobility Oral cavity/oropharynx: No erythema or exudate, no lesions present 2+ tonsils  Neck Neck and Trachea: Midline trachea without mass or lesion Thyroid: No mass or nodularity Lymphatics: No lymphadenopathy   Studies Reviewed: CT max face 01/07/24 CLINICAL DATA:  Swelling of the right parotid gland.  Fixed mass.   EXAM: CT MAXILLOFACIAL WITH CONTRAST   TECHNIQUE: Multidetector CT imaging of the maxillofacial structures was performed with intravenous contrast. Multiplanar CT  image reconstructions were also generated.   RADIATION DOSE REDUCTION: This exam was performed according to the departmental dose-optimization program which includes automated exposure control, adjustment of the mA and/or kV according to patient size and/or use of iterative reconstruction technique.   CONTRAST:  75mL OMNIPAQUE  IOHEXOL  300 MG/ML  SOLN   COMPARISON:  None Available.   FINDINGS: Osseous: No regional bone abnormality.   Orbits: Normal   Sinuses: Mild polypoid mucosal thickening of the maxillary sinuses without advanced sinusitis. Other sinuses are clear.   Soft tissues: Left parotid gland is normal. Submandibular glands are normal. Within the inferior aspect of the right parotid gland, primarily in the superficial lobe but with some extension to the medial deep lobe, is a well-circumscribed mildly heterogeneous mass without pronounced necrosis. This measures 5 cm cephalo caudal, 4.2 cm right to left and 3 cm front to back. 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 Because of the large size of the lesion in the well-circumscribed margins, this is not likely to be aggressive.   Mildly enlarged adjacent level 1 level 2 junction node on the right measuring 17 x 13 mm is at the upper limits of normal in size. No other regional lymph nodes which could be potentially abnormal by CT.   Visualized intracranial structures are unremarkable.   IMPRESSION: 1. 5 x 4.2 x 3 cm well-circumscribed mass in the inferior aspect of the right parotid gland, primarily in the superficial lobe but with some extension to the medial deep lobe. 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 Because of the large size of the lesion and the well-circumscribed margins, this is not likely to be aggressive. 2. Mildly enlarged adjacent level 1 level 2 junction node on the right  measuring 17 x 13 mm is at the upper limits of normal in size. No other regional lymph nodes which could be potentially abnormal by CT.    Assessment/Plan: Encounter Diagnoses  Name Primary?   Mass of right parotid gland Yes   Tobacco dependence     Assessment and Plan Assessment & Plan Assessment and Plan    Parotid gland mass right side  5 cm encapsulated mass in right parotid gland with possible lymph node which appears mildly enlarged level 1-2. Biopsy needed to better evaluate risk of malignancy.  - Order fine needle aspiration biopsy of the parotid gland mass. - Discuss biopsy results in follow-up appointment.   Tobacco use disorder.  We had an extensive discussion about detrimental effects of smoking on overall health. I provided resources available at [facility] to assist with smoking cessation. I spent 4 min on counseling  - Advised smoking cessation  Myocardial infarction History of two myocardial infarctions. On Eliquis , relevant for surgical planning.  Polycythemia and hx of Gilbert's sy  - will consider when obtaining surgical clearance   Thank you for allowing me to participate in the care of this patient. Please do not hesitate to contact me with any questions or concerns.   Artice Last, MD Otolaryngology [facility] Phone: [phone] Fax: [phone]    01/17/2024, 9:33 AM

## 2024-01-17 NOTE — Patient Instructions (Signed)
 Dear Paul Bullock,   Congratulations for your interest in quitting smoking!  Find a program that suits you best: when you want to quit, how you need support, where you live, and how you like to learn.    If you're ready to get started TODAY, consider scheduling a visit through [email or web address].  Appointments are available from 8am to 8pm, Monday to Friday.   Most health insurance plans will cover some level of tobacco cessation visits and medications.    Additional Resources: [employer] are also available to help you quit & provide the support you'll need. Many programs are available in both [place] and Spanish and have a long history of successfully helping people get off and stay off tobacco.    Quit Smoking Apps:  quitSTART at [employer].de QuitGuide?at ForgetParking.dk Online education and resources: Smokefree  at [employer].gov Free Telephone Coaching: QuitNow,  Call 1-800-QUIT-NOW ((442)772-7302) or Text- Ready to 587-655-7795 *Quitline [place] has teamed up with Medicaid to offer a free 14 week program    Vaping- Want to Quit? Free 24/7 support. Call [facility]  [place], [place], [place], [place], [place]  [facility]

## 2024-01-17 NOTE — Progress Notes (Signed)
 Rosetta Cons, MD  Joelle Musca; P Ir Procedure Requests PROCEDURE / BIOPSY REVIEW Date: 01/17/24  Requested Biopsy site: right parotid Reason for request: right parotid mass Imaging review: Best seen on CT  Decision: Approved Imaging modality to perform: Ultrasound Schedule with: No sedation / Local anesthetic Schedule for: Any VIR  Additional comments: @Schedulers .  Please contact me with questions, concerns, or if issue pertaining to this request arise.  Rosetta Cons, MD [facility]       Previous Messages    ----- Message ----- From: Paul Bullock Sent: 01/17/2024   9:51 AM EDT To: Kimber Esterly; Ir Procedure Requests Subject: US  FNA BIOPSY SALIVARY GLAND PAROTID GLAND E*  Procedure ; US  FNA BIOPSY SALIVARY GLAND PAROTID GLAND EA ADDT'L LESION  Reason: right parotid mass Dx: Mass of right parotid gland [K11.8 (ICD-10-CM)]    History ; CT maxillofacial w contrast  Provider : Artice Last, MD  Provider contact : [phone]

## 2024-01-22 ENCOUNTER — Telehealth: Payer: Self-pay | Admitting: *Deleted

## 2024-01-22 NOTE — Telephone Encounter (Signed)
 Please advise holding Eliquis  prior to colonoscopy on 02/05/2024.  Thank you!  DW

## 2024-01-22 NOTE — Progress Notes (Signed)
 PATIENT PROFILE: Paul Bullock is a 78 y.o. male who presents to the [facility] for consultation at the request of Paul Bullock for chief complaint of high-risk colon cancer surveillance 2/2 personal hx of adenomatous colon polyps, change in bowel habits, and intermittent LLQ abdominal discomfort.   HISTORY OF PRESENT ILLNESS   Paul Bullock presents to the [facility] clinic at the request of Paul Bullock for chief complaint of high-risk colon cancer surveillance 2/2 personal hx of adenomatous colon polyps, change in bowel habits, and intermittent LLQ abdominal discomfort. Last colonoscopy was performed in [place]  back in 2017 and some polyps were removed. He thinks he was told to have a repeat colonoscopy in 5-7 years. He denies any known family history of colorectal cancer or advanced adenomas. He has been dealing with a change in his bowel habits over the last few months. He reports prior to this change he would have very regular, formed stools c/w caliber #4 on Bristol stool scale. He is now having a mushy consistency that is light brown in color. He reports he now has to have a BM within 20-30 minutes after finishing a meal. It doesn't matter what food he is eating. He hasn't been able to identify any clear food triggers. Stools have been hard to flush at times as well. There is some associated LLQ abdominal discomfort when moving his bowels and mild fecal urgency. No issues with fecal incontinence. He denies any issues with hematochezia or melena. Appetite and diet have remained stable without any unintentional weight loss. He denies any UGI symptoms such as heartburn, acid reflux, esophageal dysphagia, odynophagia, early satiety, hoarseness, or epigastric abdominal pain. He was also recently found to have right-sided parotid mass. He has seen ENT recently and has been ordered to have fine needle aspiration of parotid gland mass. He does take Eliquis  for hx of multiple DVT/PEs. There  is no history of GI bleeding.    GENERAL REVIEW OF SYSTEMS: General: negative for - fever, chills, weight gain, weight loss, fatigue Head: no injury, migraines, headaches Eyes: no jaundice, itching, dryness, tearing, redness, vision changes Nose: no injury, bleeding Mouth/Throat: no oral ulcers, swollen neck, dry mouth, sore throat, hoarseness Endocrine: no heat/cold intolerance Respiratory: no cough, wheezing, SOB Cardiovascular: no chest pain, palpitations GI: see HPI Musculoskeletal: no joint swelling, muscle/joint pain  Neurological: no seizures, syncope, dizziness, numbness/tingling Skin: no rashes, itching Hematological and Lymphatic: easy bruising, easy bleeding  MEDICATIONS: Outpatient Encounter Medications as of 01/22/2024  Medication Sig Dispense Refill  . albuterol  (PROVENTIL ) 2.5 mg /3 mL (0.083 %) nebulizer solution Inhale 2.5 mg into the lungs every 6 (six) hours as needed    . bisoprolol  (ZEBETA ) 10 MG tablet     . ELIQUIS  5 mg tablet TAKE 1 TABLET TWICE A DAY (CHANGE IN DOSE OR TABLET SIZE)    . empagliflozin  (JARDIANCE ) 10 mg tablet Take 1 tablet by mouth once daily    . ENTRESTO  49-51 mg tablet     . ezetimibe  (ZETIA ) 10 mg tablet Take 1 tablet by mouth once daily    . fluticasone  furoate-vilanteroL (BREO ELLIPTA ) 100-25 mcg/dose DsDv inhaler     . multivitamin tablet Take 1 tablet by mouth once daily    . nystatin  (MYCOSTATIN ) 100,000 unit/gram powder     . pitavastatin  calcium  4 mg Tab Take 1 tablet by mouth once daily    . spironolactone  (ALDACTONE ) 25 MG tablet Take 1 tablet by mouth once daily    .  TORsemide  (DEMADEX ) 20 MG tablet Take 10 mg by mouth     No facility-administered encounter medications on file as of 01/22/2024.    ALLERGIES: Atorvastatin and Rosuvastatin   PAST MEDICAL HISTORY: No past medical history on file.  PAST SURGICAL HISTORY: No past surgical history on file.   FAMILY HISTORY: No family history on file.   SOCIAL  HISTORY: Social History   Socioeconomic History  . Marital status: Divorced   Social Drivers of Corporate investment banker Strain: Low Risk  (01/22/2024)   Overall Financial Resource Strain (CARDIA)   . Difficulty of Paying Living Expenses: Not hard at all  Food Insecurity: No Food Insecurity (01/22/2024)   Hunger Vital Sign   . Worried About Programme researcher, broadcasting/film/video in the Last Year: Never true   . Ran Out of Food in the Last Year: Never true  Transportation Needs: No Transportation Needs (01/22/2024)   PRAPARE - Transportation   . Lack of Transportation (Medical): No   . Lack of Transportation (Non-Medical): No    Vitals:   01/22/24 1329  BP: 96/51  Pulse: 67  Weight: (!) 119.5 kg (263 lb 6.4 oz)  Height: 177.8 cm (5' 10)    Wt Readings from Last 3 Encounters:  01/22/24 (!) 119.5 kg (263 lb 6.4 oz)     PHYSICAL EXAM: Pleasant, non-toxic appearing male on exam bench. Answers all questions appropriately and thoroughly.  General: NAD, alert and oriented x 4 HEENT: PEERLA, EOMI, anciteric  Neck: supple, no JVD or thyromegaly. No lymphadenopathy.  Respiratory: CTA bilaterally, no wheezes, crackles, or other adventitious sounds Cardiac: RRR, no murmur, rub, or gallop  GI: soft, normal bowel sounds, no TTP, no HSM, no rebound or guarding Msk: no edema, well perfused with 2+ pulses, Skin: Skin color, texture, turgor normal, no rashes or lesions Lymph: no LAD Neuro: Grossly intact   REVIEW OF DATA: I have reviewed the following data today:  Previous OV notes with PCP reviewed - see HPI  ASSESSMENT AND PLAN: Paul Bullock is a 78 y.o. male presenting for an initial consultation.   Diagnoses and all orders for this visit:  Hx of adenomatous colonic polyps -     Ambulatory Referral to Colonoscopy  Change in bowel habits -     Ambulatory Referral to Colonoscopy  Abdominal discomfort in left lower quadrant -     Ambulatory Referral to Colonoscopy  Chronic  anticoagulation  Obesity (BMI 35.0-39.9 without comorbidity), unspecified    78 y/o Caucasian male with a PMH of obesity (BMI 37), HLD, CKD Stage IIIa, T2DM, COPD, CAD s/p myocardial infarction with OM2 stent in 2002 and NSTEMI 10/2021 with DES x2, chronic systolic CHF, ischemic cardiomyopathy, s/p AICD 11/2014, hx of DVT/PE on chronic anticoagulation, polycythemia, and hx of bladder cancer (2013) s/p chemoradiation therapy, and migraines presents to the [facility] clinic for chief complaint of high-risk colon cancer surveillance 2/2 personal hx of adenomatous colon polyps, change in bowel habits, and intermittent LLQ abdominal discomfort  Hx of adenomatous colon polyps  Change in bowel habits  Intermittent LLQ abdominal discomfort   - Change in bowel habits and intermittent LLQ abdominal discomfort could be 2/2 diverticular disease, slow-transit constipation, exocrine pancreatitis insufficiency, colorectal neoplasm, colon polyps, functional GI disorder, etc - Recommend daily fiber supplementation to help regulate bowels - Recommend colonoscopy for luminal evaluation to rule out structural disease - Schedule colonoscopy at [facility] with Trilyte  bowel prep - We will obtain clearance to hold Eliquis  prior to the  procedure - Further recs after procedure  I reviewed the risks (including bleeding, perforation, infection, anesthesia complications, cardiac/respiratory complications), benefits and alternatives of colonoscopy. Patient consents to proceed. Denies CP/SOB. +Eliquis .    Attestation Statement:   I personally performed the service, non-incident to. [facility])   TWYLA JONETTE PRIMMER, PA     JONETTE PRIMMER, PA-C [facility], Gastroenterology [address] [place], [place] [address] [phone]

## 2024-01-22 NOTE — Telephone Encounter (Signed)
   Pre-operative Risk Assessment    Patient Name: [name]  DOB: January 16, 1946 MRN: [id number]   Date of last office visit: 01/02/24 DR. MCLEAN Date of next office visit: NONE   Request for Surgical Clearance    Procedure:  COLONOSCOPY  Date of Surgery:  Clearance 02/05/24                                Surgeon:  DR. Lenton Rail Paul Bullock Surgeon's Group or Practice Name:  [facility] Phone number:  [phone] Fax number:  [phone]   Type of Clearance Requested:   - Medical  - Pharmacy:  Hold Apixaban  (Eliquis )     Type of Anesthesia:  Not Indicated (PROPOFOL?)   Additional requests/questions:    Paul Bullock   01/22/2024, 4:17 PM

## 2024-01-22 NOTE — Telephone Encounter (Signed)
 Dr. Mitzie Anda,  You saw this patient on 01/02/2024. Per office protocol, will you please comment on medical clearance for colonoscopy?  Please route your response to P CV DIV Preop. I will communicate with requesting office once you have given recommendations.   Thank you!  Morey Ar, NP

## 2024-01-23 ENCOUNTER — Telehealth: Payer: Self-pay

## 2024-01-23 NOTE — Telephone Encounter (Signed)
     Primary Cardiologist: Ardeen Kohler, MD  Chart reviewed as part of pre-operative protocol coverage. Given past medical history and time since last visit, based on ACC/AHA guidelines, [place] Redwine would be at acceptable risk for the planned procedure without further cardiovascular testing.   Per office protocol, patient can hold Eliquis  for 1 day prior to procedure.   Patient will not need bridging with Lovenox  (enoxaparin ) around procedure.   Because of history of unprovoked PE, if need more than 1 day hold would need bridging with enoxaparin  per our protocol.  I will route this recommendation to the requesting party via Epic fax function and remove from pre-op pool.  Please call with questions.  Chet Cota. Allon Costlow NP-C     01/23/2024, 3:12 PM [facility] [address] Office [phone] Fax [phone]

## 2024-01-23 NOTE — Telephone Encounter (Signed)
 Patient with diagnosis of recurrent PE (unprovoked) on Eliquis  for anticoagulation.    Procedure:  COLONOSCOPY   Date of Surgery:  Clearance 02/05/24     CrCl 93 Platelet count 111  Per office protocol, patient can hold Eliquis  for 1 day prior to procedure.   Patient will not need bridging with Lovenox  (enoxaparin ) around procedure.  Because of history of unprovoked PE, if need more than 1 day hold would need bridging with enoxaparin  per our protocol.  **This guidance is not considered finalized until pre-operative APP has relayed final recommendations.**

## 2024-01-23 NOTE — Telephone Encounter (Signed)
   Pre-operative Risk Assessment    Patient Name: Paul Bullock  DOB: [birth year] MRN: [id number]   Date of last office visit: 01/02/24 Date of next office visit: n/a   Request for Surgical Clearance    Procedure:  Colonoscopy   Date of Surgery:  Clearance 02/05/24                                 Surgeon:   Surgeon's Group or Practice Name:  [facility]  Phone number:  [phone] Fax number:  [phone]   Type of Clearance Requested:   - Medical  - Pharmacy:  Hold Apixaban  (Eliquis ) Eliquis  CrCl (mL/min >60  2 days , 30-59 3 days , 15-29 4 days     Type of Anesthesia:  Not Indicated   Additional requests/questions:    Paul Bullock   01/23/2024, 5:27 PM

## 2024-01-24 NOTE — Progress Notes (Signed)
 Patient for US  guided Core RT Parotid mass Biopsy on Monday 01/27/24, I called and spoke with the patient on the phone and gave pre-procedure instructions. Pt was made aware to be here at 12:30p and check in at the [facility] registration desk. Pt stated understanding.  Called 01/24/24

## 2024-01-27 ENCOUNTER — Ambulatory Visit
Admission: RE | Admit: 2024-01-27 | Discharge: 2024-01-27 | Disposition: A | Discharge status: Home / Self Care | Source: Ambulatory Visit | Attending: Otolaryngology | Admitting: Otolaryngology

## 2024-01-27 DIAGNOSIS — K118 Other diseases of salivary glands: Secondary | ICD-10-CM | POA: Insufficient documentation

## 2024-01-27 MED ORDER — LIDOCAINE HCL (PF) 1 % IJ SOLN
3.0000 mL | Freq: Once | INTRAMUSCULAR | Status: AC
  Administered 2024-01-27: 3 mL via INTRADERMAL
  Filled 2024-01-27: qty 4

## 2024-01-27 NOTE — Procedures (Signed)
 Pre procedural Dx: Right parotid mass  Post procedural Dx: Same  Technically successful U/S guided biopsy of right parotid mass   EBL: None.   Complications: None immediate.   KANDICE Banner, MD Pager #: [phone]

## 2024-01-27 NOTE — Telephone Encounter (Signed)
     Primary Cardiologist: Fonda Kitty, MD  Chart reviewed as part of pre-operative protocol coverage. Given past medical history and time since last visit, based on ACC/AHA guidelines, Paul Bullock would be at acceptable risk for the planned procedure without further cardiovascular testing.   Per office protocol, patient can hold Eliquis  for 2 days prior to procedure.   I will route this recommendation to the requesting party via Epic fax function and remove from pre-op pool.  Please call with questions.  Josefa HERO. Aniya Jolicoeur NP-C     01/27/2024, 1:46 PM [facility] [address] Office [phone] Fax [phone]

## 2024-01-27 NOTE — Telephone Encounter (Signed)
 Patient with diagnosis of DVT/PE on Eliquis  for anticoagulation.    Procedure: colonoscopy Date of procedure: 02/05/24   CrCl 70 ml/min Platelet count 111  Per office protocol, patient can hold Eliquis  for 2 days prior to procedure.    **This guidance is not considered finalized until pre-operative APP has relayed final recommendations.**

## 2024-01-28 LAB — SURGICAL PATHOLOGY

## 2024-01-31 ENCOUNTER — Telehealth (INDEPENDENT_AMBULATORY_CARE_PROVIDER_SITE_OTHER): Payer: Self-pay | Admitting: Otolaryngology

## 2024-01-31 NOTE — Telephone Encounter (Signed)
 Patient called and left a voicemail regarding biopsy results. Please advise

## 2024-02-03 ENCOUNTER — Telehealth (INDEPENDENT_AMBULATORY_CARE_PROVIDER_SITE_OTHER): Payer: Self-pay

## 2024-02-03 NOTE — Telephone Encounter (Signed)
 I let the patient know the results of his biopsy and I sent him to the front to schedule a follow-up visit. Patient understood.

## 2024-02-05 ENCOUNTER — Ambulatory Visit
Admission: RE | Admit: 2024-02-05 | Discharge: 2024-02-05 | Disposition: A | Discharge status: Home / Self Care | Attending: Gastroenterology | Admitting: Gastroenterology

## 2024-02-05 ENCOUNTER — Ambulatory Visit: Admitting: Anesthesiology

## 2024-02-05 ENCOUNTER — Encounter
Admission: RE | Disposition: A | Discharge status: Home / Self Care | Payer: Self-pay | Source: Home / Self Care | Attending: Gastroenterology

## 2024-02-05 ENCOUNTER — Encounter: Payer: Self-pay | Admitting: Gastroenterology

## 2024-02-05 ENCOUNTER — Other Ambulatory Visit: Payer: Self-pay

## 2024-02-05 DIAGNOSIS — J449 Chronic obstructive pulmonary disease, unspecified: Secondary | ICD-10-CM | POA: Diagnosis not present

## 2024-02-05 DIAGNOSIS — Z95810 Presence of automatic (implantable) cardiac defibrillator: Secondary | ICD-10-CM | POA: Insufficient documentation

## 2024-02-05 DIAGNOSIS — Z1211 Encounter for screening for malignant neoplasm of colon: Secondary | ICD-10-CM | POA: Diagnosis present

## 2024-02-05 DIAGNOSIS — I110 Hypertensive heart disease with heart failure: Secondary | ICD-10-CM | POA: Insufficient documentation

## 2024-02-05 DIAGNOSIS — E66813 Obesity, class 3: Secondary | ICD-10-CM | POA: Diagnosis not present

## 2024-02-05 DIAGNOSIS — I5022 Chronic systolic (congestive) heart failure: Secondary | ICD-10-CM | POA: Insufficient documentation

## 2024-02-05 DIAGNOSIS — K5730 Diverticulosis of large intestine without perforation or abscess without bleeding: Secondary | ICD-10-CM | POA: Insufficient documentation

## 2024-02-05 DIAGNOSIS — Z6838 Body mass index (BMI) 38.0-38.9, adult: Secondary | ICD-10-CM | POA: Diagnosis not present

## 2024-02-05 DIAGNOSIS — I252 Old myocardial infarction: Secondary | ICD-10-CM | POA: Insufficient documentation

## 2024-02-05 DIAGNOSIS — D122 Benign neoplasm of ascending colon: Secondary | ICD-10-CM | POA: Insufficient documentation

## 2024-02-05 DIAGNOSIS — E119 Type 2 diabetes mellitus without complications: Secondary | ICD-10-CM | POA: Insufficient documentation

## 2024-02-05 DIAGNOSIS — F17210 Nicotine dependence, cigarettes, uncomplicated: Secondary | ICD-10-CM | POA: Insufficient documentation

## 2024-02-05 DIAGNOSIS — Z955 Presence of coronary angioplasty implant and graft: Secondary | ICD-10-CM | POA: Diagnosis not present

## 2024-02-05 DIAGNOSIS — I2510 Atherosclerotic heart disease of native coronary artery without angina pectoris: Secondary | ICD-10-CM | POA: Insufficient documentation

## 2024-02-05 HISTORY — PX: POLYPECTOMY: SHX149

## 2024-02-05 HISTORY — PX: HEMOSTASIS CLIP PLACEMENT: SHX6857

## 2024-02-05 HISTORY — PX: COLONOSCOPY: SHX5424

## 2024-02-05 SURGERY — COLONOSCOPY
Anesthesia: General

## 2024-02-05 MED ORDER — PROPOFOL 500 MG/50ML IV EMUL
INTRAVENOUS | Status: DC | PRN
  Administered 2024-02-05: 140 ug/kg/min via INTRAVENOUS

## 2024-02-05 MED ORDER — PROPOFOL 10 MG/ML IV BOLUS
INTRAVENOUS | Status: DC | PRN
  Administered 2024-02-05: 60 mg via INTRAVENOUS

## 2024-02-05 MED ORDER — SODIUM CHLORIDE 0.9 % IV SOLN: INTRAVENOUS | Status: DC

## 2024-02-05 MED ORDER — EPHEDRINE SULFATE (PRESSORS) 50 MG/ML IJ SOLN
INTRAMUSCULAR | Status: DC | PRN
  Administered 2024-02-05: 10 mg via INTRAVENOUS

## 2024-02-05 NOTE — Anesthesia Preprocedure Evaluation (Addendum)
 Anesthesia Evaluation  Patient identified by MRN, date of birth, ID band Patient awake    Reviewed: Allergy & Precautions, H&P , NPO status , Patient's Chart, lab work & pertinent test results  Airway Mallampati: III  TM Distance: >3 FB Neck ROM: full    Dental  (+) Missing   Pulmonary COPD, Current Smoker and Patient abstained from smoking.   Pulmonary exam normal        Cardiovascular hypertension, + CAD, + Past MI, + Cardiac Stents and +CHF  Normal cardiovascular exam+ Cardiac Defibrillator   ECHO 12/24:  1. Left ventricular ejection fraction, by estimation, is 25 to 30%. Left  ventricular ejection fraction by 2D MOD biplane is 24.7 %. The left  ventricle has severely decreased function. The left ventricle demonstrates  global hypokinesis. The left  ventricular internal cavity size was mildly dilated. Left ventricular  diastolic parameters are consistent with Grade I diastolic dysfunction  (impaired relaxation). The average left ventricular global longitudinal  strain is -3.9 %.   2. Right ventricular systolic function is normal. The right ventricular  size is normal. There is normal pulmonary artery systolic pressure. The  estimated right ventricular systolic pressure is 14.7 mmHg.   3. The mitral valve is normal in structure. No evidence of mitral valve  regurgitation. No evidence of mitral stenosis.   4. The aortic valve is normal in structure. Aortic valve regurgitation is  not visualized. No aortic stenosis is present.   5. The inferior vena cava is normal in size with greater than 50%  respiratory variability, suggesting right atrial pressure of 3 mmHg.     Neuro/Psych  Headaches PSYCHIATRIC DISORDERS  Depression       GI/Hepatic negative GI ROS, Neg liver ROS,,,  Endo/Other  diabetes  Class 3 obesity  Renal/GU Renal disease  negative genitourinary   Musculoskeletal   Abdominal  (+) + obese  Peds   Hematology Polycythemia   Anesthesia Other Findings Past Medical History: No date: AICD (automatic cardioverter/defibrillator) present     Comment:  a. 11/2014 s/p MDT DDMB 1D4 [facility] MRI XT DR AICD (ser#               [id number]). No date: Bladder cancer [facility])     Comment:  a. 2013 s/p chemo/xrt. No date: CAD (coronary artery disease)     Comment:  a. 11/2020 s/p MI/cardiac arrest-->DES to LCX x 1; b.               10/2011 Cath: LAD 42m w/ aneurysmal post-stenotic               dilation-->med rx. LCX 50d to prior stent; c. 10/2021               NSTEMI/Cath: LM 30ost/m, LAD 90p/m, 57m, LCX 20p/m,               patent stent, LPAV 60, RCA mild diff dzs, EF<25%, LVEDP               . No date: Chronic HFrEF (heart failure with reduced ejection fraction)  (HCC)     Comment:  a. EF prev as low as 20% w/ subsequent improvement to               50-55%; b. 11/2020 limited echo: windows were               challenging...LVEF largely preserved.; c. 10/2021 LV  gram: EF <25%, LVEDP . No date: COPD (chronic obstructive pulmonary disease) (HCC) No date: Depression No date: Diverticulitis No date: DVT (deep venous thrombosis) (HCC) No date: Essential hypertension No date: Hyperlipidemia LDL goal <70 No date: Ischemic cardiomyopathy     Comment:  a. EF prev as low as 20%; b. 11/2014 s/p MDT [id number]               [facility] MRI XT DR AICD; c. subsequent improvement in EF to               50-55%; d. 11/2020 limited echo: windows were               challenging...LVEF largely preserved.; e. 10/2021 LV               Gram: EF <25%. 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 No date: Thrombocytopenia (HCC) No date: Tobacco abuse No date: Varicose veins of left lower extremity  Past Surgical History: No date: CARDIAC DEFIBRILLATOR PLACEMENT 10/18/2021:  CORONARY STENT INTERVENTION; N/A     Comment:  Procedure: CORONARY STENT INTERVENTION;  Surgeon: Darron Deatrice LABOR, MD;  [facility]: [facility] INVAS[facility] CV LAB;  Service:               Cardiovascular;  Laterality: N/A;  Prox and Mid LAD No date: MOHS SURGERY No date: PERCUTANEOUS CORONARY STENT INTERVENTION (PCI-S) 10/13/2021: RIGHT/LEFT HEART CATH AND CORONARY ANGIOGRAPHY; N/A     Comment:  Procedure: RIGHT/LEFT HEART CATH AND CORONARY               ANGIOGRAPHY;  Surgeon: Darron Deatrice LABOR, MD;  Location:               [facility] INVASIVE CV LAB;  Service: Cardiovascular;                Laterality: N/A; No date: VASECTOMY No date: VASECTOMY REVERSAL     Reproductive/Obstetrics negative OB ROS                              Anesthesia Physical Anesthesia Plan  ASA: 3  Anesthesia Plan: General   Post-op Pain Management: Minimal or no pain anticipated   Induction: Intravenous  PONV Risk Score and Plan: Propofol infusion and TIVA  Airway Management Planned: Natural Airway  Additional Equipment:   Intra-op Plan:   Post-operative Plan:   Informed Consent: I have reviewed the patients History and Physical, chart, labs and discussed the procedure including the risks, benefits and alternatives for the proposed anesthesia with the patient or authorized representative who has indicated his/her understanding and acceptance.     Dental Advisory Given  Plan Discussed with: CRNA and Surgeon  Anesthesia Plan Comments:          Anesthesia Quick Evaluation

## 2024-02-05 NOTE — Op Note (Addendum)
 [facility] Gastroenterology Patient Name: Paul Bullock Procedure Date: 02/05/2024 10:09 AM MRN: [id number] Account #: [id number] Date of Birth: 12-21-1945 Admit Type: Outpatient Age: 78 Room: [facility] ENDO ROOM 3 Gender: Male Note Status: Supervisor Override Instrument Name: Arvis [id number] Procedure:             Colonoscopy Indications:           High risk colon cancer surveillance: Personal history                         of colonic polyps, Change in bowel habits Providers:             Ruel Kung MD, MD Referring MD:          Jone Dauphin, MD Medicines:             Monitored Anesthesia Care Complications:         No immediate complications. Procedure:             Pre-Anesthesia Assessment:                        - Prior to the procedure, a History and Physical was                         performed, and patient medications, allergies and                         sensitivities were reviewed. The patient's tolerance                         of previous anesthesia was reviewed.                        - The risks and benefits of the procedure and the                         sedation options and risks were discussed with the                         patient. All questions were answered and informed                         consent was obtained.                        - ASA Grade Assessment: II - A patient with mild                         systemic disease.                        After obtaining informed consent, the colonoscope was                         passed under direct vision. Throughout the procedure,                         the patient's blood pressure, pulse, and oxygen  saturations were monitored continuously. The                         Colonoscope was introduced through the anus and                         advanced to the the cecum, identified by the                         appendiceal orifice. The colonoscopy was performed                          with ease. The patient tolerated the procedure well.                         The quality of the bowel preparation was fair. The                         ileocecal valve, appendiceal orifice, and rectum were                         photographed. Findings:      The perianal and digital rectal examinations were normal.      Multiple small-mouthed diverticula were found in the sigmoid colon.      Multiple 19 sessile polyps were found in the ascending colon. The polyps       were 7 to 10 mm in size. These polyps were removed with a cold snare.       Resection and retrieval were complete. To prevent bleeding after the       polypectomy, two hemostatic clips were successfully placed. Clip       manufacturer: [employer]. There was no bleeding at the end of the       procedure.      A 3 mm polyp was found in the ascending colon. The polyp was sessile.       The polyp was removed with a cold biopsy forceps. Resection and       retrieval were complete.      The exam was otherwise without abnormality on direct and retroflexion       views. Impression:            - Preparation of the colon was fair.                        - Diverticulosis in the sigmoid colon.                        - Multiple 19 7 to 10 mm polyps in the ascending                         colon, removed with a cold snare. Resected and                         retrieved. Clips were placed. Clip manufacturer:                         [employer].                        -  One 3 mm polyp in the ascending colon, removed with                         a cold biopsy forceps. Resected and retrieved.                        - The examination was otherwise normal on direct and                         retroflexion views. Recommendation:        - Discharge patient to home (with escort).                        - Resume previous diet.                        - Resume Eliquis  (apixaban ) at prior dose in 2 days.                         - Await pathology results.                        - Repeat colonoscopy in 6 months for surveillance of                         multiple polyps. Procedure Code(s):     --- Professional ---                        [id number], Colonoscopy, flexible; with removal of                         tumor(s), polyp(s), or other lesion(s) by snare                         technique                        45380, 59, Colonoscopy, flexible; with biopsy, single                         or multiple Diagnosis Code(s):     --- Professional ---                        D12.2, Benign neoplasm of ascending colon                        R19.4, Change in bowel habit                        K57.30, Diverticulosis of large intestine without                         perforation or abscess without bleeding CPT copyright 2022 American Medical Association. All rights reserved. The codes documented in this report are preliminary and upon coder review may  be revised to meet current compliance requirements. Ruel Kung, MD Ruel Kung MD, MD 02/05/2024 10:54:07 AM This report has been signed electronically. Number of Addenda: 0 Note Initiated On: 02/05/2024 10:09 AM Scope Withdrawal Time: 0 hours 24 minutes 55 seconds  Total Procedure Duration: 0 hours 26 minutes 57 seconds  Estimated Blood Loss:  Estimated blood loss: none.      [facility]

## 2024-02-05 NOTE — Anesthesia Postprocedure Evaluation (Signed)
 Anesthesia Post Note  Patient: Oryan Winterton  Procedure(s) Performed: COLONOSCOPY POLYPECTOMY, INTESTINE CONTROL OF HEMORRHAGE, GI TRACT, ENDOSCOPIC, BY CLIPPING OR OVERSEWING  Patient location during evaluation: Endoscopy Anesthesia Type: General Level of consciousness: awake and alert Pain management: pain level controlled Vital Signs Assessment: post-procedure vital signs reviewed and stable Respiratory status: spontaneous breathing, nonlabored ventilation and respiratory function stable Cardiovascular status: blood pressure returned to baseline and stable Postop Assessment: no apparent nausea or vomiting Anesthetic complications: no   No notable events documented.   Last Vitals:  Vitals:   02/05/24 1057 02/05/24 1109  BP: (!) 91/55 (!) 91/46  Pulse: 72 64  Resp: (!) 23 (!) 24  Temp:    SpO2: 93% 98%    Last Pain:  Vitals:   02/05/24 1109  TempSrc:   PainSc: 0-No pain                 Camellia Merilee Louder

## 2024-02-05 NOTE — H&P (Signed)
 Ruel Kung , MD [address], [place], [place], [address] Phone: [phone] Fax: [phone]  Primary Care Physician:  Shawn Sick, MD   Pre-Procedure History & Physical: HPI:  Paul Bullock is a 78 y.o. male is here for an colonoscopy.   Past Medical History:  Diagnosis Date   AICD (automatic cardioverter/defibrillator) present    a. 11/2014 s/p MDT DDMB 1D4 [facility] MRI XT DR AICD (ser# [id number]).   Bladder cancer (HCC)    a. 2013 s/p chemo/xrt.   CAD (coronary artery disease)    a. 11/2020 s/p MI/cardiac arrest-->DES to LCX x 1; b. 10/2011 Cath: LAD 49m w/ aneurysmal post-stenotic dilation-->med rx. LCX 50d to prior stent; c. 10/2021 NSTEMI/Cath: LM 30ost/m, LAD 90p/m, 63m, LCX 20p/m, patent stent, LPAV 60, RCA mild diff dzs, EF<25%, LVEDP .   Chronic HFrEF (heart failure with reduced ejection fraction) (HCC)    a. EF prev as low as 20% w/ subsequent improvement to 50-55%; b. 11/2020 limited echo: windows were challenging...LVEF largely preserved.; c. 10/2021 LV gram: EF <25%, LVEDP .   COPD (chronic obstructive pulmonary disease) (HCC)    Depression    Diverticulitis    DVT (deep venous thrombosis) (HCC)    Essential hypertension    Hyperlipidemia LDL goal <70    Ischemic cardiomyopathy    a. EF prev as low as 20%; b. 11/2014 s/p MDT IIFA8I5 [facility] MRI XT DR AICD; c. subsequent improvement in EF to 50-55%; d. 11/2020 limited echo: windows were challenging...LVEF largely preserved.; e. 10/2021 LV Gram: EF <25%.   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   Thrombocytopenia (HCC)    Tobacco abuse    Varicose veins of left lower extremity     Past Surgical History:  Procedure Laterality Date   CARDIAC DEFIBRILLATOR PLACEMENT     CORONARY  STENT INTERVENTION N/A 10/18/2021   Procedure: CORONARY STENT INTERVENTION;  Surgeon: Darron Deatrice LABOR, MD;  Location: [facility] INVASIVE CV LAB;  Service: Cardiovascular;  Laterality: N/A;  Prox and Mid LAD   MOHS SURGERY     PERCUTANEOUS CORONARY STENT INTERVENTION (PCI-S)     RIGHT/LEFT HEART CATH AND CORONARY ANGIOGRAPHY N/A 10/13/2021   Procedure: RIGHT/LEFT HEART CATH AND CORONARY ANGIOGRAPHY;  Surgeon: Darron Deatrice LABOR, MD;  Location: [facility] INVASIVE CV LAB;  Service: Cardiovascular;  Laterality: N/A;   VASECTOMY     VASECTOMY REVERSAL      Prior to Admission medications   Medication Sig Start Date End Date Taking? Authorizing Provider  albuterol  (PROVENTIL ) (2.5 MG/3ML) 0.083% nebulizer solution Take 3 mLs (2.5 mg total) by nebulization every 6 (six) hours as needed for wheezing or shortness of breath. 12/25/23 12/19/24  Lovie Clarity, MD  bisoprolol  (ZEBETA ) 10 MG tablet TAKE 1 TABLET DAILY (CHANGE IN DOSAGE OR PILL SIZE) 12/24/23   Rolan Ezra RAMAN, MD  Cholecalciferol  (VITAMIN D -3) 25 MCG (1000 UT) CAPS Take 1 capsule by mouth daily.    [provider]  ELIQUIS  5 MG TABS tablet TAKE 1 TABLET TWICE A DAY (CHANGE IN DOSE OR TABLET SIZE) 12/24/23   Rolan Ezra RAMAN, MD  empagliflozin  (JARDIANCE ) 10 MG TABS tablet Take 1 tablet (10 mg total) by mouth daily. 04/18/23   Rolan Ezra RAMAN, MD  ezetimibe  (ZETIA ) 10 MG tablet TAKE 1 TABLET DAILY 12/24/23   Rolan Ezra RAMAN, MD  fluticasone  furoate-vilanterol [facility]  ELLIPTA) 100-25 MCG/ACT AEPB Inhale 1 puff into the lungs daily. 04/23/23 04/17/24  Lovie Clarity, MD  Multiple Vitamins-Minerals (MULTIVITAMIN WITH MINERALS) tablet Take 1 tablet by mouth daily.    [provider]  nystatin  (MYCOSTATIN ) 100000 UNIT/ML suspension Take 2 mLs (200,000 Units total) by mouth 4 (four) times daily. 12/25/23   Lovie Clarity, MD  Pitavastatin  Calcium  4 MG TABS TAKE 1 TABLET DAILY 01/21/23   Rolan Ezra RAMAN, MD  sacubitril -valsartan  (ENTRESTO ) 97-103 MG  Take 1 tablet by mouth 2 (two) times daily. 04/18/23   Rolan Ezra RAMAN, MD  spironolactone  (ALDACTONE ) 25 MG tablet Take 1 tablet (25 mg total) by mouth daily. 04/18/23   Rolan Ezra RAMAN, MD  torsemide  (DEMADEX ) 20 MG tablet Take 0.5 tablets (10 mg total) by mouth as needed. Take PRN for weight gain/edema. (3lbs in 1 day or 5 lbs in 1 week) 04/18/23   Rolan Ezra RAMAN, MD  varenicline  (CHANTIX ) 1 MG tablet Take 0.5 mg by mouth daily x 3 days, then 0.5 mg twice daily x 4 days, then 1 mg twice daily after 01/02/24   Rolan Ezra RAMAN, MD    Allergies as of 01/22/2024 - Review Complete 01/17/2024  Allergen Reaction Noted   Atorvastatin Other (See Comments) 11/22/2021   Rosuvastatin  Other (See Comments) 11/22/2021    Family History  Problem Relation Age of Onset   Stroke Mother    Bladder Cancer Father    Heart attack Brother     Social History   Socioeconomic History   Marital status: Single    Spouse name: Not on file   Number of children: Not on file   Years of education: Not on file   Highest education level: Not on file  Occupational History   Not on file  Tobacco Use   Smoking status: Every Day    Current packs/day: 0.75    Average packs/day: 0.8 packs/day for 55.0 years (41.3 ttl pk-yrs)    Types: Cigarettes   Smokeless tobacco: Never   Tobacco comments:    10 cigs per day  Vaping Use   Vaping status: Never Used  Substance and Sexual Activity   Alcohol use: Not Currently    Comment: seldom   Drug use: Never   Sexual activity: Not Currently  Other Topics Concern   Not on file  Social History Narrative   Since Dec 2022, he has been living in [place].  His ex-wife lives here and they are very close friends.  He does not routinely exercise.   Social Drivers of Corporate investment banker Strain: Low Risk  (01/22/2024)   Received from [facility]   Overall Financial Resource Strain (CARDIA)    Difficulty of Paying Living Expenses: Not hard at all   Food Insecurity: No Food Insecurity (01/22/2024)   Received from [facility]   Hunger Vital Sign    Within the past 12 months, you worried that your food would run out before you got the money to buy more.: Never true    Within the past 12 months, the food you bought just didn't last and you didn't have money to get more.: Never true  Transportation Needs: No Transportation Needs (01/22/2024)   Received from [facility] - Transportation    In the past 12 months, has lack of transportation kept you from medical appointments or from getting medications?: No    Lack of Transportation (Non-Medical): No  Physical Activity: Insufficiently  Active (11/14/2022)   Exercise Vital Sign    Days of Exercise per Week: 2 days    Minutes of Exercise per Session: 30 min  Stress: No Stress Concern Present (11/14/2022)   [employer] of Occupational Health - Occupational Stress Questionnaire    Feeling of Stress : Not at all  Social Connections: Socially Isolated (11/14/2022)   Social Connection and Isolation Panel    Frequency of Communication with Friends and Family: More than three times a week    Frequency of Social Gatherings with Friends and Family: Once a week    Attends Religious Services: Never    Database administrator or Organizations: No    Attends Banker Meetings: Never    Marital Status: Divorced  Catering manager Violence: Not At Risk (11/14/2022)   Humiliation, Afraid, Rape, and Kick questionnaire    Fear of Current or Ex-Partner: No    Emotionally Abused: No    Physically Abused: No    Sexually Abused: No    Review of Systems: See HPI, otherwise negative ROS  Physical Exam: There were no vitals taken for this visit. General:   Alert,  pleasant and cooperative in NAD Head:  Normocephalic and atraumatic. Neck:  Supple; no masses or thyromegaly. Lungs:  Clear throughout to auscultation, normal respiratory effort.     Heart:  +S1, +S2, Regular rate and rhythm, No edema. Abdomen:  Soft, nontender and nondistended. Normal bowel sounds, without guarding, and without rebound.   Neurologic:  Alert and  oriented x4;  grossly normal neurologically.  Impression/Plan: Paul Bullock is here for an colonoscopy to be performed for change in bowel habits. Risks, benefits, limitations, and alternatives regarding  colonoscopy have been reviewed with the patient.  Questions have been answered.  All parties agreeable.   Ruel Kung, MD  02/05/2024, 9:26 AM

## 2024-02-05 NOTE — Transfer of Care (Signed)
 Immediate Anesthesia Transfer of Care Note  Patient: Paul Bullock  Procedure(s) Performed: COLONOSCOPY POLYPECTOMY, INTESTINE CONTROL OF HEMORRHAGE, GI TRACT, ENDOSCOPIC, BY CLIPPING OR OVERSEWING  Patient Location: PACU  Anesthesia Type:General  Level of Consciousness: awake, alert , and oriented  Airway & Oxygen Therapy: Patient Spontanous Breathing  Post-op Assessment: Report given to RN and Post -op Vital signs reviewed and stable  Post vital signs: Reviewed and stable  Last Vitals:  Vitals Value Taken Time  BP 91/55 02/05/24 10:57  Temp    Pulse 67 02/05/24 10:59  Resp 27 02/05/24 11:01  SpO2 94 % 02/05/24 10:59  Vitals shown include unfiled device data.  Last Pain:  Vitals:   02/05/24 0932  TempSrc: Temporal  PainSc: 0-No pain         Complications: No notable events documented.

## 2024-02-06 LAB — SURGICAL PATHOLOGY

## 2024-02-10 ENCOUNTER — Telehealth: Payer: Self-pay | Admitting: Cardiology

## 2024-02-10 NOTE — Telephone Encounter (Signed)
 Called to sched recall

## 2024-02-11 ENCOUNTER — Ambulatory Visit: Payer: Self-pay | Admitting: Gastroenterology

## 2024-02-11 NOTE — Progress Notes (Signed)
 April inform patientabout bx results. 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    Paul Shawn Sick, MD    Regards  Dr Ruel Kung MD,MRCP [facility]) Gastroenterology/Hepatology Pager: [phone]

## 2024-02-12 ENCOUNTER — Ambulatory Visit (INDEPENDENT_AMBULATORY_CARE_PROVIDER_SITE_OTHER): Admitting: Otolaryngology

## 2024-02-12 VITALS — BP 114/65 | HR 64

## 2024-02-12 DIAGNOSIS — D119 Benign neoplasm of major salivary gland, unspecified: Secondary | ICD-10-CM

## 2024-02-12 DIAGNOSIS — K118 Other diseases of salivary glands: Secondary | ICD-10-CM

## 2024-02-12 NOTE — Progress Notes (Signed)
 ENT Progress Note:   Update 02/12/2024  Discussed the use of AI scribe software for clinical note transcription with the patient, who gave verbal consent to proceed.  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   No issues with facial movements, numbness, tingling, or further swelling/growth.  Records Reviewed:  Initial Evaluation  Reason for Consult: parotid mass    HPI: Discussed the use of AI scribe software for clinical note transcription with the patient, who gave verbal consent to proceed.  History of Present Illness Discussed the use of AI scribe software for clinical note transcription with the patient, who gave verbal consent to proceed.  History of Present Illness   Paul Bullock is a 78 year old male current smoker, hx of MI x 2, on Eliquis , who presents with a mass in the right parotid gland first noticed 2 months ago.  He discovered the mass a couple of months ago while shampooing his beard. The mass is firm and located on the right side of his face. He did not notice it initially as he had grown a beard over the last four to five months, which may have concealed it. No changes in the mass have been noted since its discovery.  No symptoms such as numbness, tingling, or facial weakness. He has not had any prior skin cancer on the head or neck. A recent scan showed a mass in the parotid gland and possibly a mildly enlarged lymph node.  His past medical history is significant for two heart attacks, and he is currently on Eliquis , a blood thinner, taken twice a day.  He smokes less than a pack a day and is currently working on smoking cessation. 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    Onc OV on 01/08/24 CHIEF COMPLAINT: Polycythemia.   INTERVAL HISTORY: Patient last evaluated in clinic in June 2023.  He was referred back for increasing hemoglobin and consideration of phlebotomy.  He continues to feel well remains asymptomatic.  Patient continues to smoke. He has no neurologic complaints.  He denies any recent fevers or illnesses.  He has a good appetite and denies weight loss.  He has no chest pain, shortness of breath, cough, or hemoptysis.  He denies any nausea, vomiting, constipation, diarrhea.  He has no urinary complaints.  Patient offers no specific complaints today.  Polycythemia: Patient's hemoglobin remains persistently elevated 18.1.  Previously workup revealed an elevated carbon monoxide level, but the remainder of her laboratory work including JAK2 mutation was either negative or within normal limits.  Patient reports he cannot donate blood to the [employer].  Proceed with 500 mL phlebotomy today.  Return to clinic in 3 months with repeat laboratory work, further evaluation, and consideration of phlebotomy if necessary.   Thrombocytopenia: Patient's platelet count has ranged between 95 and 121 since at least March 2023.  Today's result is 111.  Monitor.  Chronic and unchanged.   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 Renal insufficiency: Patient's most recent creatinine was within normal limits. Parotid lesion: Likely benign and have recommended patient have primary care refer him to ENT for further evaluation.      Past Medical History:  Diagnosis Date   AICD (automatic cardioverter/defibrillator) present    a. 11/2014 s/p MDT DDMB 1D4 [facility] MRI XT DR AICD (ser# [id number]).   Bladder cancer (HCC)    a. 2013 s/p chemo/xrt.   CAD (coronary artery disease)    a. 11/2020 s/p MI/cardiac arrest-->DES to LCX x 1; b. 10/2011 Cath: LAD 12m w/ aneurysmal post-stenotic dilation-->med rx. LCX 50d to prior stent; c. 10/2021 NSTEMI/Cath: LM  30ost/m, LAD 90p/m, 53m, LCX 20p/m, patent stent, LPAV 60, RCA mild diff dzs, EF<25%, LVEDP .   Chronic HFrEF (heart failure with reduced ejection fraction) (HCC)    a. EF prev as low as 20% w/ subsequent improvement to 50-55%; b. 11/2020 limited echo: windows were challenging...LVEF largely preserved.; c. 10/2021 LV gram: EF <25%, LVEDP .   COPD (chronic obstructive pulmonary disease) (HCC)    Depression    Diverticulitis    DVT (deep venous thrombosis) (HCC)    Essential hypertension    Hyperlipidemia LDL goal <70    Ischemic cardiomyopathy    a. EF prev as low as 20%; b. 11/2014 s/p MDT [id number] [facility] MRI XT DR AICD; c. subsequent improvement in EF to 50-55%; d. 11/2020 limited echo: windows were challenging...LVEF largely preserved.; e. 10/2021 LV Gram: EF <25%.   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   Thrombocytopenia (HCC)    Tobacco abuse    Varicose veins of left lower extremity     Past Surgical History:  Procedure Laterality Date   CARDIAC DEFIBRILLATOR PLACEMENT     COLONOSCOPY N/A 02/05/2024   Procedure: COLONOSCOPY;  Surgeon: Therisa Bi, MD;  Location: [facility] ENDOSCOPY;  Service: Gastroenterology;  Laterality: N/A;   CORONARY STENT INTERVENTION N/A 10/18/2021   Procedure: CORONARY STENT INTERVENTION;  Surgeon: Darron Deatrice LABOR, MD;  Location: [facility] INVASIVE CV LAB;  Service: Cardiovascular;  Laterality: N/A;  Prox and Mid LAD   HEMOSTASIS CLIP PLACEMENT  02/05/2024   Procedure: CONTROL OF HEMORRHAGE, GI TRACT, ENDOSCOPIC, BY CLIPPING OR OVERSEWING;  Surgeon: Therisa Bi, MD;  Location: [facility] ENDOSCOPY;  Service: Gastroenterology;;   MOHS SURGERY     PERCUTANEOUS CORONARY STENT INTERVENTION (PCI-S)     POLYPECTOMY  02/05/2024   Procedure: POLYPECTOMY, INTESTINE;  Surgeon: Therisa Bi, MD;  Location: [facility] ENDOSCOPY;  Service: Gastroenterology;;   RIGHT/LEFT HEART CATH AND  CORONARY ANGIOGRAPHY N/A 10/13/2021   Procedure: RIGHT/LEFT HEART CATH AND CORONARY ANGIOGRAPHY;  Surgeon: Darron Deatrice LABOR, MD;  Location: [facility] INVASIVE CV LAB;  Service: Cardiovascular;  Laterality: N/A;   VASECTOMY     VASECTOMY REVERSAL      Family History  Problem Relation Age of Onset   Stroke Mother    Bladder Cancer Father    Heart attack Brother     Social History:  reports that he has been smoking cigarettes. He has a 41.3 pack-year smoking history. He has never used smokeless tobacco. He reports that he does not currently use alcohol. He reports that he does not use drugs.  Allergies:  Allergies  Allergen Reactions   Atorvastatin Other (See Comments)    Myalgias   Rosuvastatin  Other (See Comments)    myalgias    Medications: I have reviewed the patient's current medications.  The PMH, PSH, Medications, Allergies, and SH were reviewed and updated.  ROS: Constitutional: Negative for fever, weight loss and weight gain. Cardiovascular: Negative for chest pain and dyspnea  on exertion. Respiratory: Is not experiencing shortness of breath at rest. Gastrointestinal: Negative for nausea and vomiting. Neurological: Negative for headaches. Psychiatric: The patient is not nervous/anxious  Blood pressure 114/65, pulse 64, SpO2 91%. There is no height or weight on file to calculate BMI.  PHYSICAL EXAM:  Exam: General: Well-developed, well-nourished Respiratory Respiratory effort: Equal inspiration and expiration without stridor Cardiovascular Peripheral Vascular: Warm extremities with equal color/perfusion Eyes: No nystagmus with equal extraocular motion bilaterally Neuro/Psych/Balance: Patient oriented to person, place, and time; Appropriate mood and affect; Gait is intact with no imbalance; Cranial nerves I-XII are intact Head and Face Inspection: Normocephalic and atraumatic without mass or lesion Palpation: Facial skeleton intact without bony stepoffs Salivary  Glands: previously seen large palpable Right parotid mass 4-5 cm in size no overlying skin changes Facial Strength: Facial motility symmetric and full bilaterally ENT Pinna: External ear intact and fully developed External canal: Canal is patent with intact skin Tympanic Membrane: Clear and mobile External Nose: No scar or anatomic deformity Internal Nose: Septum is relatively straight. No polyp, or purulence.  Lips, Teeth, and gums: Mucosa and teeth intact and viable TMJ: No pain to palpation with full mobility Oral cavity/oropharynx: No erythema or exudate, no lesions present 2+ tonsils  Neck Neck and Trachea: Midline trachea without mass or lesion Thyroid: No mass or nodularity Lymphatics: No lymphadenopathy   Studies Reviewed: CT max face 01/07/24 CLINICAL DATA:  Swelling of the right parotid gland.  Fixed mass.   EXAM: CT MAXILLOFACIAL WITH CONTRAST   TECHNIQUE: Multidetector CT imaging of the maxillofacial structures was performed with intravenous contrast. Multiplanar CT image reconstructions were also generated.   RADIATION DOSE REDUCTION: This exam was performed according to the departmental dose-optimization program which includes automated exposure control, adjustment of the mA and/or kV according to patient size and/or use of iterative reconstruction technique.   CONTRAST:  75mL OMNIPAQUE  IOHEXOL  300 MG/ML  SOLN   COMPARISON:  None Available.   FINDINGS: Osseous: No regional bone abnormality.   Orbits: Normal   Sinuses: Mild polypoid mucosal thickening of the maxillary sinuses without advanced sinusitis. Other sinuses are clear.   Soft tissues: Left parotid gland is normal. Submandibular glands are normal. Within the inferior aspect of the right parotid gland, primarily in the superficial lobe but with some extension to the medial deep lobe, is a well-circumscribed mildly heterogeneous mass without pronounced necrosis. This measures 5 cm cephalo caudal,  4.2 cm right to left and 3 cm front to back. 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 Because of the large size of the lesion in the well-circumscribed margins, this is not likely to be aggressive.   Mildly enlarged adjacent level 1 level 2 junction node on the right measuring 17 x 13 mm is at the upper limits of normal in size. No other regional lymph nodes which could be potentially abnormal by CT.   Visualized intracranial structures are unremarkable.   IMPRESSION: 1. 5 x 4.2 x 3 cm well-circumscribed mass in the inferior aspect of the right parotid gland, primarily in the superficial lobe but with some extension to the medial deep lobe. 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 Because of the large size of the lesion and the well-circumscribed margins, this is not likely to be aggressive. 2. Mildly enlarged adjacent level 1 level 2 junction node on the right measuring 17 x 13 mm is at the upper limits of  normal in size. No other regional lymph nodes which could be potentially abnormal by CT.    Biopsy result of R parotid mass 01/27/24 - Warthin's tumor  Assessment/Plan: Encounter Diagnoses  Name Primary?   Mass of right parotid gland Yes   Warthin's tumor      Assessment and Plan Assessment & Plan Assessment and Plan    Parotid gland mass right side  5 cm encapsulated mass in right parotid gland with possible lymph node which appears mildly enlarged level 1-2. Biopsy needed to better evaluate risk of malignancy.  - Order fine needle aspiration biopsy of the parotid gland mass. - Discuss biopsy results in follow-up appointment.   Tobacco use disorder.  We had an extensive discussion about detrimental effects of smoking on overall health. I provided resources available at [facility] to assist with smoking cessation. I spent 4 min on counseling   - Advised smoking cessation  Myocardial infarction History of two myocardial infarctions. On Eliquis , relevant for surgical planning.   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 Tumor near facial nerve and large, low risk of malignant transformation, slow-growing. Opted for monitoring over surgery due to surgical risks and slow growth. CN 7 intact on exam. - Monitor with annual scans. - Instruct him to call in a year to schedule the scan. - Monitor for symptoms like numbness, tingling, facial movement issues, or swelling.    Elena Larry, MD Otolaryngology [facility] Phone: [phone] Fax: [phone]    02/12/2024, 1:20 PM

## 2024-02-19 ENCOUNTER — Encounter: Payer: Self-pay | Admitting: *Deleted

## 2024-02-19 ENCOUNTER — Ambulatory Visit: Payer: Medicare Other

## 2024-02-19 DIAGNOSIS — I255 Ischemic cardiomyopathy: Secondary | ICD-10-CM

## 2024-02-20 LAB — CUP PACEART REMOTE DEVICE CHECK
Battery Remaining Longevity: 13 mo
Battery Voltage: 2.89 V
Brady Statistic AP VP Percent: 0.01 %
Brady Statistic AP VS Percent: 13.7 %
Brady Statistic AS VP Percent: 0.04 %
Brady Statistic AS VS Percent: 86.26 %
Brady Statistic RA Percent Paced: 13.6 %
Brady Statistic RV Percent Paced: 0.05 %
Date Time Interrogation Session: 20250716001604
HighPow Impedance: 79 Ohm
Implantable Lead Connection Status: 753985
Implantable Lead Connection Status: 753985
Implantable Lead Implant Date: 20160412
Implantable Lead Implant Date: 20160412
Implantable Lead Location: 753859
Implantable Lead Location: 753860
Implantable Lead Model: 5076
Implantable Lead Model: 6935
Implantable Pulse Generator Implant Date: 20160412
Lead Channel Impedance Value: 304 Ohm
Lead Channel Impedance Value: 361 Ohm
Lead Channel Impedance Value: 418 Ohm
Lead Channel Pacing Threshold Amplitude: 0.625 V
Lead Channel Pacing Threshold Amplitude: 1.75 V
Lead Channel Pacing Threshold Pulse Width: 0.4 ms
Lead Channel Pacing Threshold Pulse Width: 0.4 ms
Lead Channel Sensing Intrinsic Amplitude: 4.125 mV
Lead Channel Sensing Intrinsic Amplitude: 4.125 mV
Lead Channel Sensing Intrinsic Amplitude: 5.375 mV
Lead Channel Sensing Intrinsic Amplitude: 5.375 mV
Lead Channel Setting Pacing Amplitude: 1.5 V
Lead Channel Setting Pacing Amplitude: 2.5 V
Lead Channel Setting Pacing Pulse Width: 1 ms
Lead Channel Setting Sensing Sensitivity: 0.3 mV
Zone Setting Status: 755011

## 2024-02-23 ENCOUNTER — Ambulatory Visit: Payer: Self-pay | Admitting: Cardiology

## 2024-03-02 ENCOUNTER — Other Ambulatory Visit: Payer: Self-pay

## 2024-03-02 DIAGNOSIS — J449 Chronic obstructive pulmonary disease, unspecified: Secondary | ICD-10-CM

## 2024-03-02 MED ORDER — FLUTICASONE FUROATE-VILANTEROL 100-25 MCG/ACT IN AEPB: 1.0000 | INHALATION_SPRAY | Freq: Every day | RESPIRATORY_TRACT | 3 refills | Status: DC

## 2024-03-04 ENCOUNTER — Ambulatory Visit: Payer: Self-pay | Admitting: Student

## 2024-03-04 VITALS — BP 130/68 | HR 81 | Temp 97.6°F | Ht 70.0 in | Wt 265.4 lb

## 2024-03-04 DIAGNOSIS — E119 Type 2 diabetes mellitus without complications: Secondary | ICD-10-CM

## 2024-03-04 DIAGNOSIS — Z7985 Long-term (current) use of injectable non-insulin antidiabetic drugs: Secondary | ICD-10-CM | POA: Diagnosis not present

## 2024-03-04 DIAGNOSIS — L304 Erythema intertrigo: Secondary | ICD-10-CM

## 2024-03-04 MED ORDER — CLOTRIMAZOLE 1 % EX CREA: 1.0000 | TOPICAL_CREAM | Freq: Two times a day (BID) | CUTANEOUS | Status: DC

## 2024-03-04 NOTE — Patient Instructions (Addendum)
 Return in about 3 months (around 06/04/2024) for routine physical.  Clean and dry the area thoroughly. You can use a hair-dryer on low or no-heat setting to achieve this. Then, apply clotrimazole  (Lotrimin ) cream for your groin rash. Apply it twice daily until the rash clears.  Remember to bring all of the medications that you take (including over the counter medications and supplements) with you to every clinic visit.  This after visit summary is an important review of tests, referrals, and medication changes that were discussed during your visit. If you have questions or concerns, call [phone]. Outside of clinic business hours, call the main hospital at [phone] and ask the operator for the on-call internal medicine resident.   Ozell Kung MD 03/04/2024, 9:21 AM

## 2024-03-04 NOTE — Progress Notes (Signed)
 Patient name: Paul Bullock Date of birth: 1946-04-15 Date of visit: 03/04/24  Subjective   Chief concern: rash  Has rash in groin ongoing for a couple of weeks. Itchy and uncomfortable. Had a similar rash before and was treated with nystatin  to good effect. The other day he was trying to get a look at the rash standing in front of the mirror and he thought he saw an area of black skin which prompted his visit today.  Review of Systems  Constitutional:  Negative for fever.  Genitourinary:        No changes to urinary habits    Current Outpatient Medications  Medication Instructions   albuterol  (PROVENTIL ) 2.5 mg, Nebulization, Every 6 hours PRN   bisoprolol  (ZEBETA ) 10 MG tablet TAKE 1 TABLET DAILY (CHANGE IN DOSAGE OR PILL SIZE)   Cholecalciferol  (VITAMIN D -3) 25 MCG (1000 UT) CAPS 1 capsule, Daily   ELIQUIS  5 MG TABS tablet TAKE 1 TABLET TWICE A DAY (CHANGE IN DOSE OR TABLET SIZE)   empagliflozin  (JARDIANCE ) 10 mg, Oral, Daily   ezetimibe  (ZETIA ) 10 mg, Oral, Daily   fluticasone  furoate-vilanterol (BREO ELLIPTA ) 100-25 MCG/ACT AEPB 1 puff, Inhalation, Daily   Multiple Vitamins-Minerals (MULTIVITAMIN WITH MINERALS) tablet 1 tablet, Daily   nystatin  (MYCOSTATIN ) 200,000 Units, Oral, 4 times daily   Pitavastatin  Calcium  4 mg, Oral, Daily   sacubitril -valsartan  (ENTRESTO ) 97-103 MG 1 tablet, Oral, 2 times daily   spironolactone  (ALDACTONE ) 25 mg, Oral, Daily   torsemide  (DEMADEX ) 10 mg, Oral, As needed, Take PRN for weight gain/edema. (3lbs in 1 day or 5 lbs in 1 week)   varenicline  (CHANTIX ) 1 MG tablet Take 0.5 mg by mouth daily x 3 days, then 0.5 mg twice daily x 4 days, then 1 mg twice daily after     Objective  Today's Vitals   03/04/24 0833  BP: 130/68  Pulse: 81  Temp: 97.6 F (36.4 C)  TempSrc: Oral  SpO2: 91%  Weight: 265 lb 6.4 oz (120.4 kg)  Height: 5' 10 (1.778 m)  Body mass index is 38.08 kg/m.   Physical Exam Constitutional:      Appearance: Normal  appearance.  Cardiovascular:     Rate and Rhythm: Normal rate and regular rhythm.     Pulses: Normal pulses.  Pulmonary:     Effort: Pulmonary effort is normal. No respiratory distress.  Skin:    General: Skin is warm and dry.     Findings: Rash (beefy red plaques in intertriginous areas in groin) present.  Neurological:     Mental Status: He is alert.     Cranial Nerves: No facial asymmetry.  Psychiatric:        Mood and Affect: Affect normal.        Speech: Speech normal.        Behavior: Behavior normal.      Assessment & Plan   Intertrigo Assessment & Plan: Intertrigo has returned. Not sure what the black area of skin that he saw was but on exam today he has no necrosis in the groin. Dry area thoroughly after cleaning. Recommend clotrimazole  cream until symptoms improve. Hold SGLT-2 inhibitor for now.   Orders: -     Clotrimazole ; Apply 1 Application topically 2 (two) times daily.  Type 2 diabetes mellitus without complication, without long-term current use of insulin  (HCC) Assessment & Plan: Chronic and stable. Last A1c 6.4. Hold Jardiance  until groin intertrigo improves.     Return in about 3 months (around 06/04/2024) for routine  physical.  Ozell Kung MD 03/04/2024, 9:58 AM

## 2024-03-04 NOTE — Assessment & Plan Note (Signed)
 Chronic and stable. Last A1c 6.4. Hold Jardiance  until groin intertrigo improves.

## 2024-03-04 NOTE — Progress Notes (Signed)
 Internal Medicine Clinic Attending  Case discussed with the resident at the time of the visit.  We reviewed the resident's history and exam and pertinent patient test results.  I agree with the assessment, diagnosis, and plan of care documented in the resident's note.

## 2024-03-04 NOTE — Assessment & Plan Note (Signed)
 Intertrigo has returned. Not sure what the black area of skin that he saw was but on exam today he has no necrosis in the groin. Dry area thoroughly after cleaning. Recommend clotrimazole  cream until symptoms improve. Hold SGLT-2 inhibitor for now.

## 2024-03-09 ENCOUNTER — Other Ambulatory Visit (HOSPITAL_COMMUNITY): Payer: Self-pay | Admitting: *Deleted

## 2024-03-09 MED ORDER — SACUBITRIL-VALSARTAN 97-103 MG PO TABS: 1.0000 | ORAL_TABLET | Freq: Two times a day (BID) | ORAL | 3 refills | Status: DC

## 2024-03-09 MED ORDER — PITAVASTATIN CALCIUM 4 MG PO TABS: 1.0000 | ORAL_TABLET | Freq: Every day | ORAL | 3 refills | Status: DC

## 2024-03-09 NOTE — Assessment & Plan Note (Signed)
-   Reviewed report from GI. He had 19 polyps, all adenomas, at 02/2024 colonoscopy, so GI has arranged 4-6 month follow up with him in 05/2024 with plans for repeat colonoscopy

## 2024-04-09 ENCOUNTER — Inpatient Hospital Stay

## 2024-04-09 ENCOUNTER — Encounter: Payer: Self-pay | Admitting: Oncology

## 2024-04-09 ENCOUNTER — Inpatient Hospital Stay (HOSPITAL_BASED_OUTPATIENT_CLINIC_OR_DEPARTMENT_OTHER): Admitting: Oncology

## 2024-04-09 ENCOUNTER — Inpatient Hospital Stay: Attending: Oncology

## 2024-04-09 VITALS — BP 102/58 | HR 90 | Temp 98.8°F | Resp 18 | Ht 70.0 in | Wt 265.0 lb

## 2024-04-09 DIAGNOSIS — F17210 Nicotine dependence, cigarettes, uncomplicated: Secondary | ICD-10-CM | POA: Diagnosis not present

## 2024-04-09 DIAGNOSIS — Z7901 Long term (current) use of anticoagulants: Secondary | ICD-10-CM | POA: Insufficient documentation

## 2024-04-09 DIAGNOSIS — Z923 Personal history of irradiation: Secondary | ICD-10-CM | POA: Insufficient documentation

## 2024-04-09 DIAGNOSIS — D751 Secondary polycythemia: Secondary | ICD-10-CM | POA: Insufficient documentation

## 2024-04-09 DIAGNOSIS — Z9221 Personal history of antineoplastic chemotherapy: Secondary | ICD-10-CM | POA: Insufficient documentation

## 2024-04-09 DIAGNOSIS — Z86711 Personal history of pulmonary embolism: Secondary | ICD-10-CM | POA: Insufficient documentation

## 2024-04-09 DIAGNOSIS — Z86718 Personal history of other venous thrombosis and embolism: Secondary | ICD-10-CM | POA: Insufficient documentation

## 2024-04-09 LAB — CBC WITH DIFFERENTIAL/PLATELET
Abs Immature Granulocytes: 0.02 K/uL (ref 0.00–0.07)
Basophils Absolute: 0.1 K/uL (ref 0.0–0.1)
Basophils Relative: 1 %
Eosinophils Absolute: 0.6 K/uL — ABNORMAL HIGH (ref 0.0–0.5)
Eosinophils Relative: 10 %
HCT: 54.7 % — ABNORMAL HIGH (ref 39.0–52.0)
Hemoglobin: 18.6 g/dL — ABNORMAL HIGH (ref 13.0–17.0)
Immature Granulocytes: 0 %
Lymphocytes Relative: 19 %
Lymphs Abs: 1.2 K/uL (ref 0.7–4.0)
MCH: 32.1 pg (ref 26.0–34.0)
MCHC: 34 g/dL (ref 30.0–36.0)
MCV: 94.3 fL (ref 80.0–100.0)
Monocytes Absolute: 0.7 K/uL (ref 0.1–1.0)
Monocytes Relative: 12 %
Neutro Abs: 3.7 K/uL (ref 1.7–7.7)
Neutrophils Relative %: 58 %
Platelets: 102 K/uL — ABNORMAL LOW (ref 150–400)
RBC: 5.8 MIL/uL (ref 4.22–5.81)
RDW: 12.5 % (ref 11.5–15.5)
WBC: 6.3 K/uL (ref 4.0–10.5)
nRBC: 0 % (ref 0.0–0.2)

## 2024-04-09 NOTE — Progress Notes (Unsigned)
 Patient is doing well, no new questions for the doctor today.

## 2024-04-09 NOTE — Progress Notes (Unsigned)
 [facility]  [phone] [phone]  ID: Lynwood Blumenthal OB: 12/03/1945  MR#: [id number]  RDW#:[id number]  Patient Care Team: Shawn Sick, MD as PCP - General Kennyth Chew, MD as PCP - Cardiology (Cardiology) Jacobo Evalene PARAS, MD as Consulting Physician (Oncology)   CHIEF COMPLAINT: Polycythemia.  INTERVAL HISTORY: Patient returns to clinic today for repeat laboratory work, further evaluation, and consideration of phlebotomy.  He continues to smoke.  He currently feels well and is asymptomatic.  He has no neurologic complaints.  He denies any recent fevers or illnesses.  He has a good appetite and denies weight loss.  He has no chest pain, shortness of breath, cough, or hemoptysis.  He denies any nausea, vomiting, constipation, diarrhea.  He has no urinary complaints.  Patient offers no specific complaints today.  REVIEW OF SYSTEMS:   Review of Systems  Constitutional: Negative.  Negative for fever, malaise/fatigue and weight loss.  Respiratory: Negative.  Negative for cough and shortness of breath.   Cardiovascular: Negative.  Negative for chest pain and leg swelling.  Gastrointestinal: Negative.  Negative for abdominal pain.  Genitourinary: Negative.  Negative for dysuria.  Musculoskeletal: Negative.  Negative for back pain.  Skin: Negative.  Negative for rash.  Neurological: Negative.  Negative for dizziness, focal weakness, weakness and headaches.  Psychiatric/Behavioral: Negative.  The patient is not nervous/anxious.     As per HPI. Otherwise, a complete review of systems is negative.  PAST MEDICAL HISTORY: Past Medical History:  Diagnosis Date   AICD (automatic cardioverter/defibrillator) present    a. 11/2014 s/p MDT DDMB 1D4 [facility] MRI XT DR AICD (ser# [id number]).   Bladder cancer (HCC)    a. 2013 s/p chemo/xrt.   CAD (coronary artery disease)    a. 11/2020 s/p MI/cardiac arrest-->DES to LCX x 1; b. 10/2011 Cath: LAD 75m w/  aneurysmal post-stenotic dilation-->med rx. LCX 50d to prior stent; c. 10/2021 NSTEMI/Cath: LM 30ost/m, LAD 90p/m, 23m, LCX 20p/m, patent stent, LPAV 60, RCA mild diff dzs, EF<25%, LVEDP .   Chronic HFrEF (heart failure with reduced ejection fraction) (HCC)    a. EF prev as low as 20% w/ subsequent improvement to 50-55%; b. 11/2020 limited echo: windows were challenging...LVEF largely preserved.; c. 10/2021 LV gram: EF <25%, LVEDP .   COPD (chronic obstructive pulmonary disease) (HCC)    Depression    Diverticulitis    DVT (deep venous thrombosis) (HCC)    Essential hypertension    Hyperlipidemia LDL goal <70    Ischemic cardiomyopathy    a. EF prev as low as 20%; b. 11/2014 s/p MDT [id number] [facility] MRI XT DR AICD; c. subsequent improvement in EF to 50-55%; d. 11/2020 limited echo: windows were challenging...LVEF largely preserved.; e. 10/2021 LV Gram: EF <25%.   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   Thrombocytopenia (HCC)    Tobacco abuse    Varicose veins of left lower extremity     PAST SURGICAL HISTORY: Past Surgical History:  Procedure Laterality Date   CARDIAC DEFIBRILLATOR PLACEMENT     COLONOSCOPY N/A 02/05/2024   Procedure: COLONOSCOPY;  Surgeon: Therisa Bi, MD;  Location: [facility] ENDOSCOPY;  Service: Gastroenterology;  Laterality: N/A;   CORONARY STENT INTERVENTION N/A 10/18/2021   Procedure: CORONARY STENT INTERVENTION;  Surgeon: Darron Deatrice LABOR, MD;  Location: [facility] INVASIVE CV LAB;  Service: Cardiovascular;  Laterality: N/A;  Prox  and Mid LAD   HEMOSTASIS CLIP PLACEMENT  02/05/2024   Procedure: CONTROL OF HEMORRHAGE, GI TRACT, ENDOSCOPIC, BY CLIPPING OR OVERSEWING;  Surgeon: Therisa Bi, MD;  Location: [facility] ENDOSCOPY;  Service: Gastroenterology;;   MOHS SURGERY     PERCUTANEOUS CORONARY STENT INTERVENTION (PCI-S)     POLYPECTOMY  02/05/2024   Procedure: POLYPECTOMY,  INTESTINE;  Surgeon: Therisa Bi, MD;  Location: [facility] ENDOSCOPY;  Service: Gastroenterology;;   RIGHT/LEFT HEART CATH AND CORONARY ANGIOGRAPHY N/A 10/13/2021   Procedure: RIGHT/LEFT HEART CATH AND CORONARY ANGIOGRAPHY;  Surgeon: Darron Deatrice LABOR, MD;  Location: [facility] INVASIVE CV LAB;  Service: Cardiovascular;  Laterality: N/A;   VASECTOMY     VASECTOMY REVERSAL      FAMILY HISTORY: Family History  Problem Relation Age of Onset   Stroke Mother    Bladder Cancer Father    Heart attack Brother     ADVANCED DIRECTIVES (Y/N):  N  HEALTH MAINTENANCE: Social History   Tobacco Use   Smoking status: Every Day    Current packs/day: 0.75    Average packs/day: 0.8 packs/day for 55.0 years (41.3 ttl pk-yrs)    Types: Cigarettes   Smokeless tobacco: Never   Tobacco comments:    10 cigs per day  Vaping Use   Vaping status: Never Used  Substance Use Topics   Alcohol use: Not Currently    Comment: seldom   Drug use: Never     Colonoscopy:  PAP:  Bone density:  Lipid panel:  Allergies  Allergen Reactions   Atorvastatin Other (See Comments)    Myalgias   Rosuvastatin  Other (See Comments)    myalgias    Current Outpatient Medications  Medication Sig Dispense Refill   albuterol  (PROVENTIL ) (2.5 MG/3ML) 0.083% nebulizer solution Take 3 mLs (2.5 mg total) by nebulization every 6 (six) hours as needed for wheezing or shortness of breath. 540 mL 3   bisoprolol  (ZEBETA ) 10 MG tablet TAKE 1 TABLET DAILY (CHANGE IN DOSAGE OR PILL SIZE) 90 tablet 3   Cholecalciferol  (VITAMIN D -3) 25 MCG (1000 UT) CAPS Take 1 capsule by mouth daily.     clotrimazole  (CLOTRIMAZOLE  ANTI-FUNGAL) 1 % cream Apply 1 Application topically 2 (two) times daily.     ELIQUIS  5 MG TABS tablet TAKE 1 TABLET TWICE A DAY (CHANGE IN DOSE OR TABLET SIZE) 180 tablet 3   empagliflozin  (JARDIANCE ) 10 MG TABS tablet Take 1 tablet (10 mg total) by mouth daily. 90 tablet 3   ezetimibe  (ZETIA ) 10 MG tablet TAKE 1 TABLET DAILY 90  tablet 3   fluticasone  furoate-vilanterol (BREO ELLIPTA ) 100-25 MCG/ACT AEPB Inhale 1 puff into the lungs daily. 90 each 3   Multiple Vitamins-Minerals (MULTIVITAMIN WITH MINERALS) tablet Take 1 tablet by mouth daily.     Pitavastatin  Calcium  4 MG TABS Take 1 tablet (4 mg total) by mouth daily. 90 tablet 3   sacubitril -valsartan  (ENTRESTO ) 97-103 MG Take 1 tablet by mouth 2 (two) times daily. 180 tablet 3   spironolactone  (ALDACTONE ) 25 MG tablet Take 1 tablet (25 mg total) by mouth daily. 90 tablet 3   torsemide  (DEMADEX ) 20 MG tablet Take 0.5 tablets (10 mg total) by mouth as needed. Take PRN for weight gain/edema. (3lbs in 1 day or 5 lbs in 1 week)     No current facility-administered medications for this visit.    OBJECTIVE: Vitals:   04/09/24 0958  BP: (!) 102/58  Pulse: 90  Resp: 18  Temp: 98.8 F (37.1 C)  SpO2: 100%  Body mass index is 38.02 kg/m.    ECOG FS:0 - Asymptomatic  General: Well-developed, well-nourished, no acute distress. Eyes: Pink conjunctiva, anicteric sclera. HEENT: Normocephalic, moist mucous membranes. Lungs: No audible wheezing or coughing. Heart: Regular rate and rhythm. Abdomen: Soft, nontender, no obvious distention. Musculoskeletal: No edema, cyanosis, or clubbing. Neuro: Alert, answering all questions appropriately. Cranial nerves grossly intact. Skin: No rashes or petechiae noted. Psych: Normal affect.  LAB RESULTS:  Lab Results  Component Value Date   NA 142 12/25/2023   K 4.6 12/25/2023   CL 104 12/25/2023   CO2 18 (L) 12/25/2023   GLUCOSE 111 (H) 12/25/2023   BUN 18 12/25/2023   CREATININE 1.21 12/25/2023   CALCIUM  9.9 12/25/2023   PROT 6.6 12/25/2023   ALBUMIN 4.2 12/25/2023   AST 19 12/25/2023   ALT 20 12/25/2023   ALKPHOS 74 12/25/2023   BILITOT 1.6 (H) 12/25/2023   GFRNONAA >60 04/29/2023    Lab Results  Component Value Date   WBC 6.3 04/09/2024   NEUTROABS 3.7 04/09/2024   HGB 18.6 (H) 04/09/2024   HCT 54.7 (H)  04/09/2024   MCV 94.3 04/09/2024   PLT 102 (L) 04/09/2024     STUDIES: No results found.   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  Patient's hemoglobin remains persistently elevated at 18.6.   Previously workup revealed an elevated carbon monoxide level, but the remainder of her laboratory work including JAK2 mutation with reflex was either negative or within normal limits.  Patient reports he cannot donate blood to the [employer].  Despite goal hemoglobin 17.5, patient does not wish to undergo phlebotomy today.  He agreed to return to clinic in 2 months for repeat laboratory work, further evaluation, and reconsideration of phlebotomy  Thrombocytopenia: Chronic and unchanged.  Patient's platelet count has ranged between 95 and 121 since at least March 2023.  Today's result was 102. 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 Renal insufficiency: Patient's most recent creatinine was within normal limits. Parotid lesion: Likely benign and have recommended patient have primary care refer him to ENT for further evaluation.  I spent a total of 20 minutes reviewing chart data, face-to-face evaluation with the patient, counseling and coordination of care as detailed above.    Patient expressed understanding and was in agreement with this plan. He also understands that He can call clinic at any time with any questions, concerns, or complaints.     Evalene JINNY Reusing, MD   04/10/2024 7:26 AM

## 2024-04-09 NOTE — Progress Notes (Signed)
 No phlebotomy today per Dr Jacobo.

## 2024-04-10 ENCOUNTER — Encounter: Payer: Self-pay | Admitting: Oncology

## 2024-05-01 ENCOUNTER — Telehealth: Payer: Self-pay

## 2024-05-01 NOTE — Telephone Encounter (Signed)
 COPD : RN Telephone Call   Patient ID: Joban Colledge, male    DOB: 06/09/1946  Age: 78 y.o. MRN: [id number]  Mr.Clyde Swire is a 78 y.o. who was called about COPD management.   Patient reports that they are taking the following medications for COPD: PROVENTIL  ( NEB SOL) / BREO       (  PT IS REFUSING TO BRING HIS MEDICATIONS WITH HIM HE SAID IF HE BRINGS ANYTHING IT WILL ONLY BE  THE NEB SOLUTION.)  Patient reports they  DO NOT have a prior pulmonologist who they follow: [facility]   Please document smoking status, how many packs? Ready to quit ?  - Smoking Hx: He currently smokes 1 packs per day.   mMRC (Modified Medical Research Council) Dyspnea Scale   Ask the following questions and please check the box that describes the most.   Choose ONE>   []  Grade 0: I only get breathless with strenuous exercise   []  Grade 1: I get short of breath when hurry on level ground or walking up a slight hill   [x]  Grade 2: On level Ground, I walk slower than people of the same age because of breathlessness or have to stop for breath when walking my own pace   []  Grade 3: I Stop for breath after walking about 100 yards or after a few minutes on level ground   []  Grade 4: I am too breathless to leave the house, or I am breathless when dressing    CAT (COPD Assessment Test)   Please ask each of the following questions and select between 0-5 based on what describes the patient most accurately.  On a scale 0 to 5, do you never cough or cough all the time: 2  0 (I have no phlegm in my chest at all) 0 (My chest does not feel tight at all)  0 (When I walk up a hill or one flight of stairs I am not breathless)   0 (I am not limited doing any activities at home) 0 (I am confident leaving my home despite my lung condition) 0 (I sleep soundly) On a scale 0 to 5, how would you describe your energy?: 0 (I have lots of energy)  Total: 2

## 2024-05-12 NOTE — Progress Notes (Signed)
 Remote ICD Transmission

## 2024-05-18 ENCOUNTER — Other Ambulatory Visit: Payer: Self-pay

## 2024-05-20 ENCOUNTER — Ambulatory Visit (INDEPENDENT_AMBULATORY_CARE_PROVIDER_SITE_OTHER): Payer: Medicare Other

## 2024-05-20 ENCOUNTER — Encounter: Admitting: Cardiology

## 2024-05-20 DIAGNOSIS — I255 Ischemic cardiomyopathy: Secondary | ICD-10-CM | POA: Diagnosis not present

## 2024-05-21 LAB — CUP PACEART REMOTE DEVICE CHECK
Battery Remaining Longevity: 11 mo
Battery Voltage: 2.88 V
Brady Statistic AP VP Percent: 0.01 %
Brady Statistic AP VS Percent: 15.5 %
Brady Statistic AS VP Percent: 0.04 %
Brady Statistic AS VS Percent: 84.45 %
Brady Statistic RA Percent Paced: 15.36 %
Brady Statistic RV Percent Paced: 0.05 %
Date Time Interrogation Session: 20251015012504
HighPow Impedance: 85 Ohm
Implantable Lead Connection Status: 753985
Implantable Lead Connection Status: 753985
Implantable Lead Implant Date: 20160412
Implantable Lead Implant Date: 20160412
Implantable Lead Location: 753859
Implantable Lead Location: 753860
Implantable Lead Model: 5076
Implantable Lead Model: 6935
Implantable Pulse Generator Implant Date: 20160412
Lead Channel Impedance Value: 304 Ohm
Lead Channel Impedance Value: 361 Ohm
Lead Channel Impedance Value: 475 Ohm
Lead Channel Pacing Threshold Amplitude: 0.625 V
Lead Channel Pacing Threshold Amplitude: 1.875 V
Lead Channel Pacing Threshold Pulse Width: 0.4 ms
Lead Channel Pacing Threshold Pulse Width: 0.4 ms
Lead Channel Sensing Intrinsic Amplitude: 4.125 mV
Lead Channel Sensing Intrinsic Amplitude: 4.125 mV
Lead Channel Sensing Intrinsic Amplitude: 4.625 mV
Lead Channel Sensing Intrinsic Amplitude: 4.625 mV
Lead Channel Setting Pacing Amplitude: 1.5 V
Lead Channel Setting Pacing Amplitude: 2.5 V
Lead Channel Setting Pacing Pulse Width: 1 ms
Lead Channel Setting Sensing Sensitivity: 0.3 mV
Zone Setting Status: 755011

## 2024-05-21 MED ORDER — EMPAGLIFLOZIN 10 MG PO TABS: 10.0000 mg | ORAL_TABLET | Freq: Every day | ORAL | 3 refills | Status: DC

## 2024-05-22 ENCOUNTER — Telehealth (HOSPITAL_COMMUNITY): Payer: Self-pay

## 2024-05-22 MED ORDER — SPIRONOLACTONE 25 MG PO TABS: 25.0000 mg | ORAL_TABLET | Freq: Every day | ORAL | 3 refills | Status: DC

## 2024-05-22 MED ORDER — EMPAGLIFLOZIN 10 MG PO TABS: 10.0000 mg | ORAL_TABLET | Freq: Every day | ORAL | 3 refills | Status: DC

## 2024-05-22 NOTE — Telephone Encounter (Signed)
 Refill sent to pharmacy.

## 2024-05-27 NOTE — Progress Notes (Signed)
 Remote ICD Transmission

## 2024-05-30 ENCOUNTER — Ambulatory Visit: Payer: Self-pay | Admitting: Cardiology

## 2024-06-08 ENCOUNTER — Other Ambulatory Visit: Payer: Self-pay | Admitting: *Deleted

## 2024-06-08 DIAGNOSIS — D751 Secondary polycythemia: Secondary | ICD-10-CM

## 2024-06-09 ENCOUNTER — Inpatient Hospital Stay: Attending: Oncology

## 2024-06-09 ENCOUNTER — Ambulatory Visit: Admitting: Oncology

## 2024-06-09 ENCOUNTER — Inpatient Hospital Stay

## 2024-06-09 ENCOUNTER — Encounter: Payer: Self-pay | Admitting: Oncology

## 2024-06-09 ENCOUNTER — Other Ambulatory Visit

## 2024-06-09 ENCOUNTER — Encounter

## 2024-06-09 ENCOUNTER — Inpatient Hospital Stay: Admitting: Oncology

## 2024-06-09 VITALS — BP 114/66 | HR 71 | Temp 98.6°F | Resp 16 | Wt 263.3 lb

## 2024-06-09 VITALS — BP 101/51 | HR 58 | Temp 98.2°F | Resp 16

## 2024-06-09 DIAGNOSIS — I110 Hypertensive heart disease with heart failure: Secondary | ICD-10-CM | POA: Insufficient documentation

## 2024-06-09 DIAGNOSIS — F17210 Nicotine dependence, cigarettes, uncomplicated: Secondary | ICD-10-CM | POA: Diagnosis not present

## 2024-06-09 DIAGNOSIS — Z8551 Personal history of malignant neoplasm of bladder: Secondary | ICD-10-CM | POA: Diagnosis not present

## 2024-06-09 DIAGNOSIS — Z86711 Personal history of pulmonary embolism: Secondary | ICD-10-CM | POA: Insufficient documentation

## 2024-06-09 DIAGNOSIS — I252 Old myocardial infarction: Secondary | ICD-10-CM | POA: Diagnosis not present

## 2024-06-09 DIAGNOSIS — I2510 Atherosclerotic heart disease of native coronary artery without angina pectoris: Secondary | ICD-10-CM | POA: Diagnosis not present

## 2024-06-09 DIAGNOSIS — Z79899 Other long term (current) drug therapy: Secondary | ICD-10-CM | POA: Insufficient documentation

## 2024-06-09 DIAGNOSIS — I5022 Chronic systolic (congestive) heart failure: Secondary | ICD-10-CM | POA: Insufficient documentation

## 2024-06-09 DIAGNOSIS — D751 Secondary polycythemia: Secondary | ICD-10-CM | POA: Insufficient documentation

## 2024-06-09 DIAGNOSIS — Z86718 Personal history of other venous thrombosis and embolism: Secondary | ICD-10-CM | POA: Diagnosis not present

## 2024-06-09 DIAGNOSIS — D696 Thrombocytopenia, unspecified: Secondary | ICD-10-CM | POA: Insufficient documentation

## 2024-06-09 DIAGNOSIS — Z7951 Long term (current) use of inhaled steroids: Secondary | ICD-10-CM | POA: Insufficient documentation

## 2024-06-09 DIAGNOSIS — Z7901 Long term (current) use of anticoagulants: Secondary | ICD-10-CM | POA: Diagnosis not present

## 2024-06-09 DIAGNOSIS — Z923 Personal history of irradiation: Secondary | ICD-10-CM | POA: Diagnosis not present

## 2024-06-09 DIAGNOSIS — Z9221 Personal history of antineoplastic chemotherapy: Secondary | ICD-10-CM | POA: Insufficient documentation

## 2024-06-09 LAB — CBC WITH DIFFERENTIAL/PLATELET
Abs Immature Granulocytes: 0.03 K/uL (ref 0.00–0.07)
Basophils Absolute: 0.1 K/uL (ref 0.0–0.1)
Basophils Relative: 1 %
Eosinophils Absolute: 0.6 K/uL — ABNORMAL HIGH (ref 0.0–0.5)
Eosinophils Relative: 8 %
HCT: 54.2 % — ABNORMAL HIGH (ref 39.0–52.0)
Hemoglobin: 18.7 g/dL — ABNORMAL HIGH (ref 13.0–17.0)
Immature Granulocytes: 0 %
Lymphocytes Relative: 13 %
Lymphs Abs: 1 K/uL (ref 0.7–4.0)
MCH: 32.4 pg (ref 26.0–34.0)
MCHC: 34.5 g/dL (ref 30.0–36.0)
MCV: 93.8 fL (ref 80.0–100.0)
Monocytes Absolute: 0.8 K/uL (ref 0.1–1.0)
Monocytes Relative: 11 %
Neutro Abs: 4.9 K/uL (ref 1.7–7.7)
Neutrophils Relative %: 67 %
Platelets: 101 K/uL — ABNORMAL LOW (ref 150–400)
RBC: 5.78 MIL/uL (ref 4.22–5.81)
RDW: 12.6 % (ref 11.5–15.5)
WBC: 7.3 K/uL (ref 4.0–10.5)
nRBC: 0 % (ref 0.0–0.2)

## 2024-06-09 NOTE — Progress Notes (Signed)
 Paul Bullock presents today for phlebotomy per MD orders. Phlebotomy procedure started at 1418 and ended at 1441. removed. Patient tolerated procedure well. IV needle removed intact.

## 2024-06-09 NOTE — Progress Notes (Unsigned)
 [facility] Bullock  Telephone:(336) [phone] [phone]) [phone]  ID: Lynwood Blumenthal OB: 07-Jun-1946  MR#: [id number]  RDW#:[id number]  Patient Care Team: Shawn Sick, MD as PCP - General Kennyth Chew, MD as PCP - Cardiology (Cardiology) Jacobo Evalene PARAS, MD as Consulting Physician (Oncology)   CHIEF COMPLAINT: Polycythemia.  INTERVAL HISTORY: Patient returns to clinic today for repeat laboratory, further evaluation, and consideration of phlebotomy.  He currently feels well and is asymptomatic.  He continues to smoke, but intends on quitting in the near future.  He has no neurologic complaints.  He denies any recent fevers or illnesses.  He has a good appetite and denies weight loss.  He has no chest pain, shortness of breath, cough, or hemoptysis.  He denies any nausea, vomiting, constipation, diarrhea.  He has no urinary complaints.  Patient offers no further specific complaints today.  REVIEW OF SYSTEMS:   Review of Systems  Constitutional: Negative.  Negative for fever, malaise/fatigue and weight loss.  Respiratory: Negative.  Negative for cough and shortness of breath.   Cardiovascular: Negative.  Negative for chest pain and leg swelling.  Gastrointestinal: Negative.  Negative for abdominal pain.  Genitourinary: Negative.  Negative for dysuria.  Musculoskeletal: Negative.  Negative for back pain.  Skin: Negative.  Negative for rash.  Neurological: Negative.  Negative for dizziness, focal weakness, weakness and headaches.  Psychiatric/Behavioral: Negative.  The patient is not nervous/anxious.     As per HPI. Otherwise, a complete review of systems is negative.  PAST MEDICAL HISTORY: Past Medical History:  Diagnosis Date   AICD (automatic cardioverter/defibrillator) present    a. 11/2014 s/p MDT DDMB 1D4 [facility] MRI XT DR AICD (ser# [id number]).   Bladder cancer (HCC)    a. 2013 s/p chemo/xrt.   CAD (coronary artery disease)    a. 11/2020 s/p MI/cardiac  arrest-->DES to LCX x 1; b. 10/2011 Cath: LAD 73m w/ aneurysmal post-stenotic dilation-->med rx. LCX 50d to prior stent; c. 10/2021 NSTEMI/Cath: LM 30ost/m, LAD 90p/m, 44m, LCX 20p/m, patent stent, LPAV 60, RCA mild diff dzs, EF<25%, LVEDP .   Chronic HFrEF (heart failure with reduced ejection fraction) (HCC)    a. EF prev as low as 20% w/ subsequent improvement to 50-55%; b. 11/2020 limited echo: windows were challenging...LVEF largely preserved.; c. 10/2021 LV gram: EF <25%, LVEDP .   COPD (chronic obstructive pulmonary disease) (HCC)    Depression    Diverticulitis    DVT (deep venous thrombosis) (HCC)    Essential hypertension    Hyperlipidemia LDL goal <70    Ischemic cardiomyopathy    a. EF prev as low as 20%; b. 11/2014 s/p MDT [id number] [facility] MRI XT DR AICD; c. subsequent improvement in EF to 50-55%; d. 11/2020 limited echo: windows were challenging...LVEF largely preserved.; e. 10/2021 LV Gram: EF <25%.   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   Thrombocytopenia    Tobacco abuse    Varicose veins of left lower extremity     PAST SURGICAL HISTORY: Past Surgical History:  Procedure Laterality Date   CARDIAC DEFIBRILLATOR PLACEMENT     COLONOSCOPY N/A 02/05/2024   Procedure: COLONOSCOPY;  Surgeon: Therisa Bi, MD;  Location: [facility] ENDOSCOPY;  Service: Gastroenterology;  Laterality: N/A;   CORONARY STENT INTERVENTION N/A 10/18/2021   Procedure: CORONARY STENT INTERVENTION;  Surgeon: Darron Deatrice LABOR, MD;  Location: [facility] INVASIVE CV LAB;  Service: Cardiovascular;  Laterality: N/A;  Prox and Mid LAD   HEMOSTASIS CLIP PLACEMENT  02/05/2024   Procedure: CONTROL OF HEMORRHAGE, GI TRACT, ENDOSCOPIC, BY CLIPPING OR OVERSEWING;  Surgeon: Therisa Bi, MD;  Location: [facility] ENDOSCOPY;  Service: Gastroenterology;;   MOHS SURGERY     PERCUTANEOUS CORONARY STENT INTERVENTION (PCI-S)     POLYPECTOMY   02/05/2024   Procedure: POLYPECTOMY, INTESTINE;  Surgeon: Therisa Bi, MD;  Location: [facility] ENDOSCOPY;  Service: Gastroenterology;;   RIGHT/LEFT HEART CATH AND CORONARY ANGIOGRAPHY N/A 10/13/2021   Procedure: RIGHT/LEFT HEART CATH AND CORONARY ANGIOGRAPHY;  Surgeon: Darron Deatrice LABOR, MD;  Location: [facility] INVASIVE CV LAB;  Service: Cardiovascular;  Laterality: N/A;   VASECTOMY     VASECTOMY REVERSAL      FAMILY HISTORY: Family History  Problem Relation Age of Onset   Stroke Mother    Bladder Cancer Father    Heart attack Brother     ADVANCED DIRECTIVES (Y/N):  N  HEALTH MAINTENANCE: Social History   Tobacco Use   Smoking status: Every Day    Current packs/day: 0.75    Average packs/day: 0.8 packs/day for 55.0 years (41.3 ttl pk-yrs)    Types: Cigarettes   Smokeless tobacco: Never   Tobacco comments:    10 cigs per day  Vaping Use   Vaping status: Never Used  Substance Use Topics   Alcohol use: Not Currently    Comment: seldom   Drug use: Never     Colonoscopy:  PAP:  Bone density:  Lipid panel:  Allergies  Allergen Reactions   Atorvastatin Other (See Comments)    Myalgias   Rosuvastatin  Other (See Comments)    myalgias    Current Outpatient Medications  Medication Sig Dispense Refill   albuterol  (PROVENTIL ) (2.5 MG/3ML) 0.083% nebulizer solution Take 3 mLs (2.5 mg total) by nebulization every 6 (six) hours as needed for wheezing or shortness of breath. 540 mL 3   bisoprolol  (ZEBETA ) 10 MG tablet TAKE 1 TABLET DAILY (CHANGE IN DOSAGE OR PILL SIZE) 90 tablet 3   Cholecalciferol  (VITAMIN D -3) 25 MCG (1000 UT) CAPS Take 1 capsule by mouth daily.     clotrimazole  (CLOTRIMAZOLE  ANTI-FUNGAL) 1 % cream Apply 1 Application topically 2 (two) times daily.     ELIQUIS  5 MG TABS tablet TAKE 1 TABLET TWICE A DAY (CHANGE IN DOSE OR TABLET SIZE) 180 tablet 3   empagliflozin  (JARDIANCE ) 10 MG TABS tablet Take 1 tablet (10 mg total) by mouth daily. PLEASE SCHEDULE APPOINTMENT FOR  MORE REFILLS 90 tablet 3   ezetimibe  (ZETIA ) 10 MG tablet TAKE 1 TABLET DAILY 90 tablet 3   fluticasone  furoate-vilanterol (BREO ELLIPTA ) 100-25 MCG/ACT AEPB Inhale 1 puff into the lungs daily. 90 each 3   Multiple Vitamins-Minerals (MULTIVITAMIN WITH MINERALS) tablet Take 1 tablet by mouth daily.     Pitavastatin  Calcium  4 MG TABS Take 1 tablet (4 mg total) by mouth daily. 90 tablet 3   sacubitril -valsartan  (ENTRESTO ) 97-103 MG Take 1 tablet by mouth 2 (two) times daily. 180 tablet 3   spironolactone  (ALDACTONE ) 25 MG tablet Take 1 tablet (25 mg total) by mouth daily. 90 tablet 3   torsemide  (DEMADEX ) 20 MG tablet Take 0.5 tablets (10 mg total) by mouth as needed. Take PRN for weight gain/edema. (3lbs in 1 day or 5 lbs in 1 week)     No current facility-administered medications for this visit.    OBJECTIVE: Vitals:   06/09/24 1314  BP: 114/66  Pulse: 71  Resp: 16  Temp: 98.6 F (37 C)  SpO2: 94%      Body mass index is 37.78 kg/m.    ECOG FS:0 - Asymptomatic  General: Well-developed, well-nourished, no acute distress. Eyes: Pink conjunctiva, anicteric sclera. HEENT: Normocephalic, moist mucous membranes. Lungs: No audible wheezing or coughing. Heart: Regular rate and rhythm. Abdomen: Soft, nontender, no obvious distention. Musculoskeletal: No edema, cyanosis, or clubbing. Neuro: Alert, answering all questions appropriately. Cranial nerves grossly intact. Skin: No rashes or petechiae noted. Psych: Normal affect.  LAB RESULTS:  Lab Results  Component Value Date   NA 142 12/25/2023   K 4.6 12/25/2023   CL 104 12/25/2023   CO2 18 (L) 12/25/2023   GLUCOSE 111 (H) 12/25/2023   BUN 18 12/25/2023   CREATININE 1.21 12/25/2023   CALCIUM  9.9 12/25/2023   PROT 6.6 12/25/2023   ALBUMIN 4.2 12/25/2023   AST 19 12/25/2023   ALT 20 12/25/2023   ALKPHOS 74 12/25/2023   BILITOT 1.6 (H) 12/25/2023   GFRNONAA >60 04/29/2023    Lab Results  Component Value Date   WBC 7.3  06/09/2024   NEUTROABS 4.9 06/09/2024   HGB 18.7 (H) 06/09/2024   HCT 54.2 (H) 06/09/2024   MCV 93.8 06/09/2024   PLT 101 (L) 06/09/2024     STUDIES: CUP PACEART REMOTE DEVICE CHECK Result Date: 05/21/2024 ICD Scheduled remote reviewed. Normal device function.  Presenting rhythm: AS/VS with PVC 1 NSVT, V>A, HR 203, 7 beats in duration Next remote transmission per protocol. 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  Patient's hemoglobin remains persistently elevated 18.7.  Previous workup revealed an elevated carbon monoxide level, but the remainder of his laboratory work including JAK2 mutation with reflex was either negative or within normal limits.  Patient reports he cannot donate blood to the [employer].  Proceed with 500 mL phlebotomy today.  Goal hemoglobin is 17.5.  Return to clinic in 1 and 2 months for phlebotomy only.  Patient without return to clinic in 3 months with repeat laboratory work, further evaluation, and consideration of phlebotomy if needed.   Smoking cessation: Patient plans to use nicotine patches to help quit tobacco.   Thrombocytopenia: Chronic and unchanged.  Patient's platelet count has ranged between 95 and 121 since at least March 2023.  Today's result is 101.   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 Renal insufficiency: Patient's most recent creatinine was within normal limits. Parotid lesion: Likely benign previously recommended patient have primary care refer him to ENT for further evaluation.   Patient expressed understanding and was in agreement with this plan. He also understands that He can call clinic at any time with any questions, concerns, or complaints.     Evalene JINNY Reusing, MD   06/09/2024 1:36 PM

## 2024-06-09 NOTE — Patient Instructions (Signed)

## 2024-06-10 ENCOUNTER — Encounter: Payer: Self-pay | Admitting: Oncology

## 2024-06-15 ENCOUNTER — Telehealth: Payer: Self-pay | Admitting: Cardiology

## 2024-06-15 NOTE — Telephone Encounter (Signed)
 Called to confirm/remind patient of [facility] appointment [facility] on 06/16/24.   Appointment:   [] Confirmed  [x] Left mess   [] No answer/No voice mail  [] VM Full/unable to leave message  [] Phone not in service  Patient reminded to bring all medications and/or complete list.  Confirmed patient has transportation. Gave directions, instructed to utilize valet parking.

## 2024-06-16 ENCOUNTER — Ambulatory Visit: Attending: Cardiology | Admitting: Cardiology

## 2024-06-16 VITALS — BP 94/52 | HR 78 | Wt 264.0 lb

## 2024-06-16 DIAGNOSIS — D696 Thrombocytopenia, unspecified: Secondary | ICD-10-CM | POA: Diagnosis not present

## 2024-06-16 DIAGNOSIS — Z6837 Body mass index (BMI) 37.0-37.9, adult: Secondary | ICD-10-CM | POA: Insufficient documentation

## 2024-06-16 DIAGNOSIS — I4729 Other ventricular tachycardia: Secondary | ICD-10-CM | POA: Diagnosis not present

## 2024-06-16 DIAGNOSIS — I252 Old myocardial infarction: Secondary | ICD-10-CM | POA: Insufficient documentation

## 2024-06-16 DIAGNOSIS — I255 Ischemic cardiomyopathy: Secondary | ICD-10-CM | POA: Diagnosis present

## 2024-06-16 DIAGNOSIS — J449 Chronic obstructive pulmonary disease, unspecified: Secondary | ICD-10-CM | POA: Diagnosis not present

## 2024-06-16 DIAGNOSIS — Z86718 Personal history of other venous thrombosis and embolism: Secondary | ICD-10-CM | POA: Insufficient documentation

## 2024-06-16 DIAGNOSIS — Z955 Presence of coronary angioplasty implant and graft: Secondary | ICD-10-CM | POA: Insufficient documentation

## 2024-06-16 DIAGNOSIS — I2510 Atherosclerotic heart disease of native coronary artery without angina pectoris: Secondary | ICD-10-CM | POA: Insufficient documentation

## 2024-06-16 DIAGNOSIS — Z79899 Other long term (current) drug therapy: Secondary | ICD-10-CM | POA: Diagnosis not present

## 2024-06-16 DIAGNOSIS — E669 Obesity, unspecified: Secondary | ICD-10-CM | POA: Insufficient documentation

## 2024-06-16 DIAGNOSIS — Z86711 Personal history of pulmonary embolism: Secondary | ICD-10-CM | POA: Insufficient documentation

## 2024-06-16 DIAGNOSIS — D751 Secondary polycythemia: Secondary | ICD-10-CM | POA: Diagnosis not present

## 2024-06-16 DIAGNOSIS — I5022 Chronic systolic (congestive) heart failure: Secondary | ICD-10-CM | POA: Insufficient documentation

## 2024-06-16 DIAGNOSIS — Z7901 Long term (current) use of anticoagulants: Secondary | ICD-10-CM | POA: Diagnosis not present

## 2024-06-16 DIAGNOSIS — C679 Malignant neoplasm of bladder, unspecified: Secondary | ICD-10-CM | POA: Insufficient documentation

## 2024-06-16 DIAGNOSIS — F17210 Nicotine dependence, cigarettes, uncomplicated: Secondary | ICD-10-CM | POA: Diagnosis not present

## 2024-06-16 NOTE — Progress Notes (Signed)
 PCP: Shawn Sick, MD Cardiology: Dr. Rolan  Chief complaint: CHF  78 y.o. with history of COPD, DVT/PE, ischemic cardiomyopathy, and CAD presents for followup of CHF.  Patient suffered a myocardial infarction complicated by cardiac arrest in 2002 and required stenting of the obtuse marginal.  He notes that he subsequently had LV dysfunction.  In 2013, he required repeat diagnostic catheterization which revealed moderate disease distal to previously placed obtuse marginal stent with a 75% stenosis in the mid LAD with aneurysmal post stenotic dilation.  Per notes, he was medically managed.  In the setting of LV dysfunction with an EF as low as 20%, he underwent Medtronic AICD placement in April 2016, and up until 2022, he was followed closely by cardiology in [place], [place] .  He did require admission to a hospital in [place]  in June 2002, in the setting of COPD exacerbation while visiting his son.  Per discharge summary, he was noted to have mild troponin elevation which was felt to be secondary to demand ischemia.   Paul Bullock moved to [place]  in December 2022.  His ex-wife lives nearby and they are close friends.  On October 12 2021, he developed recurrent chest discomfort and was admitted to [facility] with NSTEMI.  He underwent diagnostic catheterization at [facility] revealing severe proximal to mid LAD disease with otherwise nonobstructive disease and patent obtuse marginal stent.  His LVEDP was approximately 40 mmHg and ventriculogram showed an EF of less than 25%.  He was given Lasix  in the lab and after discussion with advanced heart failure team at [facility], he was transferred for further evaluation and potential inotropic support.  At [facility], he was diuresed and started on GDMT.  He had DES x 2 to proximal and mid LAD and was discharged home.   Echo in 10/23 showed EF 30-35%, low normal RV function.  Echo in 12/24 showed EF 25-30%, normal RV, no MR, normal IVC.   In  4/25, patient a 40 beat run of VT, not treated by device.  He did not feel it.   Weight is down 2 lbs.  He has not been using torsemide .  He was found to have polycythemia likely due to smoking, has been getting periodic phlebotomies via hematology.  He fatigues with long walks but does not get short of breath.  Ok with stairs up to his apartment.  No problems shopping in grocery store. No chest pain.  No orthopnea/PND.  No lightheadedness though BP continues to run relatively low.   Medtronic ICD: no AF/VT, 15% a-paced, stable thoracic impedance.   Labs (3/23): K 4.6, creatinine 1.02, LDL 59, hgb 17.7, plts 98K Labs (5/23): K 4.6, creatinine 1.44, LDL 56, JAK2 mutation negative, hgb 17.9, plts 123 Labs (2/24): K 4.6, creatinine 1.27 Labs (6/24): K 4.6, creatinine 1.4, BNP 108, LDL 81 Labs (9/24): K 4.2, creatinine 1.16, BNP 186, LDL 32, TGs 178 Labs (5/25): K 4.6, creatinine 1.21, LDL 49, hgb 19.7  PMH: 1. COPD: Still smoking 2. DVT/PE 3. Chronic systolic CHF: Ischemic cardiomyopathy.  Medtronic ICD from 4/16.  - Echo (3/23): EF 20-25% - RHC (3/23): mean RA 25, PA 62/47, mean PCWP 39, CI 2.03.  - Echo (10/23): EF 30-35%, low normal RV function.  - Echo (12/24): EF 25-30%, normal RV, no MR, normal IVC. 4. CAD: MI with OM2 stent in 2002.  - NSTEMI 3/23 with DES x 2 to proximal and mid LAD.  5. 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  6.  Bladder cancer 2012: Chemoradiation.  7. Type 2 DM 8. Polycythemia: Likely due to chronic smoking, JAK2 mutation negative.  9. 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  Quit smoking in 6/23 then restarted.  No ETOH.   Family History  Problem Relation Age of Onset   Stroke Mother    Bladder Cancer Father    Heart attack Brother    ROS: All systems reviewed and negative except as per HPI.   Current Outpatient Medications  Medication Sig Dispense Refill   albuterol  (PROVENTIL ) (2.5 MG/3ML) 0.083%  nebulizer solution Take 3 mLs (2.5 mg total) by nebulization every 6 (six) hours as needed for wheezing or shortness of breath. 540 mL 3   bisoprolol  (ZEBETA ) 10 MG tablet TAKE 1 TABLET DAILY (CHANGE IN DOSAGE OR PILL SIZE) 90 tablet 3   Cholecalciferol  (VITAMIN D -3) 25 MCG (1000 UT) CAPS Take 1 capsule by mouth daily.     clotrimazole  (CLOTRIMAZOLE  ANTI-FUNGAL) 1 % cream Apply 1 Application topically 2 (two) times daily.     ELIQUIS  5 MG TABS tablet TAKE 1 TABLET TWICE A DAY (CHANGE IN DOSE OR TABLET SIZE) 180 tablet 3   empagliflozin  (JARDIANCE ) 10 MG TABS tablet Take 1 tablet (10 mg total) by mouth daily. PLEASE SCHEDULE APPOINTMENT FOR MORE REFILLS 90 tablet 3   ezetimibe  (ZETIA ) 10 MG tablet TAKE 1 TABLET DAILY 90 tablet 3   fluticasone  furoate-vilanterol (BREO ELLIPTA ) 100-25 MCG/ACT AEPB Inhale 1 puff into the lungs daily. 90 each 3   Multiple Vitamins-Minerals (MULTIVITAMIN WITH MINERALS) tablet Take 1 tablet by mouth daily.     Pitavastatin  Calcium  4 MG TABS Take 1 tablet (4 mg total) by mouth daily. 90 tablet 3   sacubitril -valsartan  (ENTRESTO ) 97-103 MG Take 1 tablet by mouth 2 (two) times daily. 180 tablet 3   spironolactone  (ALDACTONE ) 25 MG tablet Take 1 tablet (25 mg total) by mouth daily. 90 tablet 3   torsemide  (DEMADEX ) 20 MG tablet Take 0.5 tablets (10 mg total) by mouth as needed. Take PRN for weight gain/edema. (3lbs in 1 day or 5 lbs in 1 week)     No current facility-administered medications for this visit.   BP (!) 94/52   Pulse 78   Wt 264 lb (119.7 kg)   SpO2 92%   BMI 37.88 kg/m  General: NAD Neck: Thick, no JVD, no thyromegaly or thyroid nodule.  Lungs: Clear to auscultation bilaterally with normal respiratory effort. CV: Nondisplaced PMI.  Heart regular S1/S2, no S3/S4, no murmur.  1+ ankle edema.  No carotid bruit.  Normal pedal pulses.  Abdomen: Soft, nontender, no hepatosplenomegaly, no distention.  Skin: Intact without lesions or rashes.  Neurologic: Alert  and oriented x 3.  Psych: Normal affect. Extremities: No clubbing or cyanosis.  HEENT: Normal.   Assessment/Plan: 1.  CAD: Patient has history of CAD with prior PCI to OM in 2002.  NSTEMI in 3/23 with cath showing complex proximal LAD disease with serial 90% stenoses separated by small aneurysmal areas.  He had a 60% mid LCx stenosis.  RCA with mild disease.  He had DES x 2 to proximal and mid LAD. No chest pain.  - No ASA given stable CAD with Eliquis  use.  - Continue Livalo  and Zetia , good lipids in 5/25.   2. Chronic systolic CHF: Medtronic ICD.  Ischemic cardiomyopathy.  Echo in 3/23 with EF 20-25%, severe LV dilation, RV mildly dysfunctional.  RHC in 3/23 with markedly elevated filling pressures, CI  2.03, and PAPI < 1. Echo in 10/23 with EF 30-35%, low normal RV function.  Echo in 12/24 showed EF 25-30%, normal RV, no MR, normal IVC.  NYHA class II, he is not volume overloaded by exam or Optivol.  BP is stable and has been good at home with no lightheadedness.   - Continue torsemide  prn.   - Continue bisoprolol  10 mg daily (beta-1 selective with COPD).  - Continue Entresto  97/103 bid.  Check BMET/BNP.    - Continue empagliflozin  10 mg daily.  - Continue spironolactone  25 mg daily.  - QRS does not appear wide enough to benefit from CRT upgrade.   - He would be a reasonable candidate for barostimulation activation therapy or CCM if symptoms worsen.  - Repeat echo in 12/25.  3. 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   - Continue apixaban .  4. COPD: He continues to smoke. Does not want to try Chantix .     5. Thrombocytopenia: Mild, chronic.  6. Bladder cancer: Has been followed by cystoscopies   7. Polycythemia: JAK2 mutation negative, likely due to chronic smoking.    - Periodic phlebotomy by hematology.  8. NSVT: No changes for now, if he has further ventricular arrhythmias may need to start amiodarone.  - Continue bisoprolol .  9. Obesity: I will see if we can get tirzepatide or  semaglutide for him.   Followup in 4 months.   I spent 32 minutes reviewing records, interviewing/examining patient, and managing orders.   Paul Bullock 06/16/2024

## 2024-06-16 NOTE — Patient Instructions (Addendum)
 Medication Changes:  Someone will reach out to you about starting Mounjaro vs Ozempic  Lab Work:  You have been given a prescription to have your lab work drawn at your PCP's appointment. Please have them fax the results to us  at [phone].    Testing/Procedures:  Your physician has requested that you have an echocardiogram. Echocardiography is a painless test that uses sound waves to create images of your heart. It provides your doctor with information about the size and shape of your heart and how well your heart's chambers and valves are working. This procedure takes approximately one hour. There are no restrictions for this procedure. Please do NOT wear cologne, perfume, aftershave, or lotions (deodorant is allowed). Please arrive 15 minutes prior to your appointment time.  Please note: We ask at that you not bring children with you during ultrasound (echo/ vascular) testing. Due to room size and safety concerns, children are not allowed in the ultrasound rooms during exams. Our front office staff cannot provide observation of children in our lobby area while testing is being conducted. An adult accompanying a patient to their appointment will only be allowed in the ultrasound room at the discretion of the ultrasound technician under special circumstances. We apologize for any inconvenience.  SOMEONE WILL BE IN CONTACT WITH YOU IN ORDER TO SCHEDULE YOUR APPOINTMENT.     Follow-Up in: Please follow up with the [facility] in 4 months with Dr. Rolan. We do not currently have that schedule. Please give us  a call in February in order to schedule your appointment for March 2026.   Thank you for choosing [facility].    At the [facility], you and your health needs are our priority. We have a designated team specialized in the treatment of Heart Failure. This Care Team includes your primary Heart Failure  Specialized Cardiologist (physician), Advanced Practice Providers (APPs- Physician Assistants and Nurse Practitioners), and Pharmacist who all work together to provide you with the care you need, when you need it.   You may see any of the following providers on your designated Care Team at your next follow up:  Dr. Toribio Fuel Dr. Ezra Rolan Dr. Ria Commander Dr. Morene Brownie Ellouise Class, FNP Jaun Bash, RPH-CPP  Please be sure to bring in all your medications bottles to every appointment.   Need to Contact Us :  If you have any questions or concerns before your next appointment please send us  a message through [place] or call our office at [phone].    TO LEAVE A MESSAGE FOR THE NURSE SELECT OPTION 2, PLEASE LEAVE A MESSAGE INCLUDING: YOUR NAME DATE OF BIRTH CALL BACK NUMBER REASON FOR CALL**this is important as we prioritize the call backs  YOU WILL RECEIVE A CALL BACK THE SAME DAY AS LONG AS YOU CALL BEFORE 4:00 PM

## 2024-06-17 ENCOUNTER — Ambulatory Visit (INDEPENDENT_AMBULATORY_CARE_PROVIDER_SITE_OTHER)

## 2024-06-17 ENCOUNTER — Other Ambulatory Visit (HOSPITAL_COMMUNITY): Payer: Self-pay

## 2024-06-17 ENCOUNTER — Telehealth (HOSPITAL_COMMUNITY): Payer: Self-pay

## 2024-06-17 ENCOUNTER — Ambulatory Visit: Payer: Self-pay

## 2024-06-17 ENCOUNTER — Other Ambulatory Visit: Payer: Self-pay

## 2024-06-17 ENCOUNTER — Telehealth (HOSPITAL_COMMUNITY): Payer: Self-pay | Admitting: Pharmacist

## 2024-06-17 VITALS — BP 117/55 | HR 74 | Temp 98.0°F | Ht 66.0 in | Wt 263.6 lb

## 2024-06-17 DIAGNOSIS — Z23 Encounter for immunization: Secondary | ICD-10-CM

## 2024-06-17 DIAGNOSIS — F17200 Nicotine dependence, unspecified, uncomplicated: Secondary | ICD-10-CM | POA: Diagnosis not present

## 2024-06-17 DIAGNOSIS — E119 Type 2 diabetes mellitus without complications: Secondary | ICD-10-CM

## 2024-06-17 DIAGNOSIS — Z7189 Other specified counseling: Secondary | ICD-10-CM

## 2024-06-17 DIAGNOSIS — R21 Rash and other nonspecific skin eruption: Secondary | ICD-10-CM

## 2024-06-17 DIAGNOSIS — Z0000 Encounter for general adult medical examination without abnormal findings: Secondary | ICD-10-CM

## 2024-06-17 DIAGNOSIS — I10 Essential (primary) hypertension: Secondary | ICD-10-CM

## 2024-06-17 DIAGNOSIS — I5022 Chronic systolic (congestive) heart failure: Secondary | ICD-10-CM

## 2024-06-17 DIAGNOSIS — N182 Chronic kidney disease, stage 2 (mild): Secondary | ICD-10-CM

## 2024-06-17 LAB — BASIC METABOLIC PANEL WITH GFR
Anion gap: 10 (ref 5–15)
BUN: 17 mg/dL (ref 8–23)
CO2: 24 mmol/L (ref 22–32)
Calcium: 8.9 mg/dL (ref 8.9–10.3)
Chloride: 105 mmol/L (ref 98–111)
Creatinine, Ser: 1.25 mg/dL — ABNORMAL HIGH (ref 0.61–1.24)
GFR, Estimated: 60 mL/min — ABNORMAL LOW (ref >60)
Glucose, Bld: 122 mg/dL — ABNORMAL HIGH (ref 70–99)
Potassium: 4.2 mmol/L (ref 3.5–5.1)
Sodium: 139 mmol/L (ref 135–145)

## 2024-06-17 LAB — HEMOGLOBIN A1C
Hgb A1c MFr Bld: 6.3 % — ABNORMAL HIGH (ref 4.8–5.6)
Mean Plasma Glucose: 134.11 mg/dL

## 2024-06-17 LAB — BRAIN NATRIURETIC PEPTIDE: B Natriuretic Peptide: 206 pg/mL — ABNORMAL HIGH (ref 0.0–100.0)

## 2024-06-17 MED ORDER — MOUNJARO 2.5 MG/0.5ML ~~LOC~~ SOAJ: 2.5000 mg | SUBCUTANEOUS | 0 refills | Status: DC

## 2024-06-17 MED ORDER — TRIAMCINOLONE ACETONIDE 0.5 % EX OINT: 1.0000 | TOPICAL_OINTMENT | Freq: Two times a day (BID) | CUTANEOUS | 0 refills | Status: AC

## 2024-06-17 NOTE — Assessment & Plan Note (Signed)
 Follows w/ Dr. Rolan. TTE 07/2023 with LVEF 25-30%, grade I diastolic dysfunction, normal RV function.  - cont Entresto  97/103 BID, empagliflozin  10 mg, spironolactone  25 mg, bisoprolol  10 mg, torsemide  PRN - TTE ordered per Cardiology

## 2024-06-17 NOTE — Progress Notes (Signed)
 Subjective:   Patient ID: Paul Bullock male   DOB: 07/11/1946 78 y.o.   MRN: [id number]  HPI: Paul Bullock is a 78 y.o. M PMH HTN, T2DM, HFrEF (EF 25-30% 07/2023), CKDII, Hx of DVT/PE on Eliquis , CAD c/b MI x2, polycythemia, COPD who presents for routine visit.   Palmar rash/back rash - he first noticed painless pruritic scaly rash on his upper back & L palm several months ago, tried using OTC moisturizing cream without relief. No growth or symptomatic worsening of lesions. Has not tried any other creams/ointments. No new exposures (pets, soaps, detergents) to his knowledge. He lives home alone and is unsure of any new or change in lesions on his back.   HFrEF, CAD - follows closely w/ Dr. Rolan (Advanced HF), most recently saw. Dr. Rolan yesterday. Planned for repeat TTE. Doing well on current regimen, no med changes at visit. To me today, he reports he feels well, no chest pain, dyspnea, orthopnea. Weight remains stable, process for GLP-1 authorization started.  HM/ACP - he has a history of colonic tubular adenomas on colonoscopy 02/2024, he declines follow up colonoscopy as he does not want to pursue any invasive management of malignancy. For this reason, he also declines lung cancer screening. In regards to ACP, he reports he has clear wishes that he has discussed with his ex-wife and his children at length. He designates his son as his POA who is also his next of kin.  As of yet, he conveys that he would want full restorative care if there was chance for meaningful quality of life. He would prefer to transition to DNR if he were to lose significant physical or neurologic function. He understands the purpose and importance of an advanced directive to document his wishes.  Past Medical History:  Diagnosis Date   AICD (automatic cardioverter/defibrillator) present    a. 11/2014 s/p MDT DDMB 1D4 [facility] MRI XT DR AICD (ser# [id number]).   Bladder cancer (HCC)    a. 2013 s/p chemo/xrt.   CAD  (coronary artery disease)    a. 11/2020 s/p MI/cardiac arrest-->DES to LCX x 1; b. 10/2011 Cath: LAD 43m w/ aneurysmal post-stenotic dilation-->med rx. LCX 50d to prior stent; c. 10/2021 NSTEMI/Cath: LM 30ost/m, LAD 90p/m, 5m, LCX 20p/m, patent stent, LPAV 60, RCA mild diff dzs, EF<25%, LVEDP .   Chronic HFrEF (heart failure with reduced ejection fraction) (HCC)    a. EF prev as low as 20% w/ subsequent improvement to 50-55%; b. 11/2020 limited echo: windows were challenging...LVEF largely preserved.; c. 10/2021 LV gram: EF <25%, LVEDP .   COPD (chronic obstructive pulmonary disease) (HCC)    Depression    Diverticulitis    DVT (deep venous thrombosis) (HCC)    Essential hypertension    Hyperlipidemia LDL goal <70    Ischemic cardiomyopathy    a. EF prev as low as 20%; b. 11/2014 s/p MDT [id number] [facility] MRI XT DR AICD; c. subsequent improvement in EF to 50-55%; d. 11/2020 limited echo: windows were challenging...LVEF largely preserved.; e. 10/2021 LV Gram: EF <25%.   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   Thrombocytopenia    Tobacco abuse    Varicose veins of left lower extremity    Current Outpatient Medications  Medication Sig Dispense Refill   albuterol  (PROVENTIL ) (2.5 MG/3ML) 0.083% nebulizer solution Take 3 mLs (2.5 mg total) by nebulization every  6 (six) hours as needed for wheezing or shortness of breath. 540 mL 3   bisoprolol  (ZEBETA ) 10 MG tablet TAKE 1 TABLET DAILY (CHANGE IN DOSAGE OR PILL SIZE) 90 tablet 3   Cholecalciferol  (VITAMIN D -3) 25 MCG (1000 UT) CAPS Take 1 capsule by mouth daily.     clotrimazole  (CLOTRIMAZOLE  ANTI-FUNGAL) 1 % cream Apply 1 Application topically 2 (two) times daily.     ELIQUIS  5 MG TABS tablet TAKE 1 TABLET TWICE A DAY (CHANGE IN DOSE OR TABLET SIZE) 180 tablet 3   empagliflozin  (JARDIANCE ) 10 MG TABS tablet Take 1 tablet (10 mg total) by mouth  daily. PLEASE SCHEDULE APPOINTMENT FOR MORE REFILLS 90 tablet 3   ezetimibe  (ZETIA ) 10 MG tablet TAKE 1 TABLET DAILY 90 tablet 3   fluticasone  furoate-vilanterol (BREO ELLIPTA ) 100-25 MCG/ACT AEPB Inhale 1 puff into the lungs daily. 90 each 3   Multiple Vitamins-Minerals (MULTIVITAMIN WITH MINERALS) tablet Take 1 tablet by mouth daily.     Pitavastatin  Calcium  4 MG TABS Take 1 tablet (4 mg total) by mouth daily. 90 tablet 3   sacubitril -valsartan  (ENTRESTO ) 97-103 MG Take 1 tablet by mouth 2 (two) times daily. 180 tablet 3   spironolactone  (ALDACTONE ) 25 MG tablet Take 1 tablet (25 mg total) by mouth daily. 90 tablet 3   torsemide  (DEMADEX ) 20 MG tablet Take 0.5 tablets (10 mg total) by mouth as needed. Take PRN for weight gain/edema. (3lbs in 1 day or 5 lbs in 1 week)     No current facility-administered medications for this visit.   Family History  Problem Relation Age of Onset   Stroke Mother    Bladder Cancer Father    Heart attack Brother    Social History   Socioeconomic History   Marital status: Single    Spouse name: Not on file   Number of children: Not on file   Years of education: Not on file   Highest education level: Not on file  Occupational History   Not on file  Tobacco Use   Smoking status: Every Day    Current packs/day: 0.75    Average packs/day: 0.8 packs/day for 55.0 years (41.3 ttl pk-yrs)    Types: Cigarettes   Smokeless tobacco: Never   Tobacco comments:    10 cigs per day  Vaping Use   Vaping status: Never Used  Substance and Sexual Activity   Alcohol use: Not Currently    Comment: seldom   Drug use: Never   Sexual activity: Not Currently  Other Topics Concern   Not on file  Social History Narrative   Since Dec 2022, he has been living in [place].  His ex-wife lives here and they are very close friends.  He does not routinely exercise.   Social Drivers of Corporate Investment Banker Strain: Low Risk  (01/22/2024)   Received from [facility]   Overall Financial Resource Strain (CARDIA)    Difficulty of Paying Living Expenses: Not hard at all  Food Insecurity: No Food Insecurity (01/22/2024)   [facility] from [facility]   Hunger Vital Sign    Within the past 12 months, you worried that your food would run out before you got the money to buy more.: Never true    Within the past 12 months, the food you bought just didn't last and you didn't have money to get more.: Never true  Transportation Needs: No Transportation Needs (01/22/2024)   [facility] from [facility]   PRAPARE - Transportation    In the past 12 months, has lack of transportation kept you from medical appointments or from getting medications?: No    Lack of Transportation (Non-Medical): No  Physical Activity: Insufficiently Active (11/14/2022)   Exercise Vital Sign    Days of Exercise per Week: 2 days    Minutes of Exercise per Session: 30 min  Stress: No Stress Concern Present (11/14/2022)   [employer] of Occupational Health - Occupational Stress Questionnaire    Feeling of Stress : Not at all  Social Connections: Socially Isolated (11/14/2022)   Social Connection and Isolation Panel    Frequency of Communication with Friends and Family: More than three times a week    Frequency of Social Gatherings with Friends and Family: Once a week    Attends Religious Services: Never    Database Administrator or Organizations: No    Attends Banker Meetings: Never    Marital Status: Divorced   Review of Systems: Pertinent items are noted in HPI. Objective:   Vitals:   06/17/24 0855  BP: (!) 117/55  Pulse: 74  Temp: 98 F (36.7 C)  TempSrc: Oral  SpO2: 95%  Weight: 263 lb 9.6 oz (119.6 kg)  Height: 5' 6 (1.676 m)    Physical Exam: GEN: Well appearing, NAD. Oriented x 3, normal mood and affect  HEENT: Normocephalic, atraumatic, Conjunctiva clear, sclera non-icteric, EOM intact CV: RRR,  no M/R/G PULM: CTAB MSK: 1+ b/l LE NEURO: moving all extremities spontaneously in upper and lower extremities. PSYCH: Oriented X3, intact recent and remote memory, judgment and insight, normal mood and affect.      Assessment & Plan:   Assessment & Plan Routine adult health maintenance - Colon cancer: Last colonoscopy 02/2024: multiple diverticula, 19 sessile polyps in ascending colon s/p resection (path: tubular adenoma negative for high grade dysplasia or malignancy, sesile serated adenoma w/o dysplasia), recommended for repeat colonoscopy however he has discussed this at length with his GI and given he is asymptomatic and would not pursue treatment of malignancy even if found, he declines further colonoscopies. - Vaccines: Shingrix ordered, Pneumovax 12/2021, Flu vaccine received today, counseled on covid/tdap vaccine. - Tobacco: >40 PY history, current smoker. Last CTA Chest 05/2022 without any e/o pulmonary masses or nodules, no significant LAD. Declines lung cancer screening test as he would not pursue treatment even if he were to be diagnosed with malignancy. I think this is fair and reasonable, we will discontinue these screenings. Rash See photos above. Palm lesion appears c/w atopic dermatitis, will trial triamcinolone ointment. He also appears to have seborrheic keratosis on his back with possible mild superimposed mild dermatitis. Encouraged moisturization. Will refer to derm for skin check and for further evaluation if his lesions do not improve. Chronic systolic CHF (congestive heart failure) (HCC) Follows w/ Dr. Rolan. TTE 07/2023 with LVEF 25-30%, grade I diastolic dysfunction, normal RV function.  - cont Entresto  97/103 BID, empagliflozin  10 mg, spironolactone  25 mg, bisoprolol  10 mg, torsemide  PRN - TTE ordered per Cardiology  Tobacco dependence Declines NRT, Chantix  Essential (primary) hypertension Cont Entresto , spironolactone , bisoprolol  as above CKD (chronic kidney  disease) stage 2, GFR 60-89 ml/min Cont Entresto , empagliflozin .  - BMP today Type 2 diabetes mellitus without complication, without long-term current use of insulin  (HCC) Cont emagliflozin. A1c stable at 6.3. GLP-1 authorization started per Cardiology. Could consider OSA workup if authorization fails (this has been discussed with patient and we will defer  for now). Encounter for immunization Flu ordered. Shingrix ordered. Advance care planning Son is POA. Encouraged patient to create advanced directive.    Jone Dauphin MD

## 2024-06-17 NOTE — Assessment & Plan Note (Signed)
 Declines NRT, Chantix 

## 2024-06-17 NOTE — Assessment & Plan Note (Signed)
 Cont Entresto , spironolactone , bisoprolol  as above

## 2024-06-17 NOTE — Patient Instructions (Addendum)
 Thank you for coming in today. If you have any questions or concerns, please feel free to contact me via MyChart or call the office.   If you have gotten labs today, I will be in touch via MyChart or telephone.   Rash - please use the triamcinolone cream twice a day for up to 2 weeks. If you are not improving with use of this cream in 1 week, please call the office.   Have a great day.

## 2024-06-17 NOTE — Telephone Encounter (Signed)
 Mounjaro is covered for $76 for 28 days. Prescription sent per Dr. Rolan. HIPAA compliant VM left with patient instructing to call back.

## 2024-06-17 NOTE — Telephone Encounter (Signed)
 Advanced Heart Failure Patient Advocate Encounter  Test billing for this patient's current coverage ([employer] / [employer]) returns a $76 copay for 28 day supply of Mounjaro, $228 for 84 days.  This test claim was processed through [facility]- copay amounts may vary at other pharmacies due to pharmacy/plan contracts, or as the patient moves through the different stages of their insurance plan.  Rachel DEL, CPhT Rx Patient Advocate Phone: [phone]

## 2024-06-17 NOTE — Assessment & Plan Note (Signed)
 Cont emagliflozin. A1c stable at 6.3. GLP-1 authorization started per Cardiology. Could consider OSA workup if authorization fails (this has been discussed with patient and we will defer for now).

## 2024-06-22 ENCOUNTER — Ambulatory Visit (INDEPENDENT_AMBULATORY_CARE_PROVIDER_SITE_OTHER): Admitting: Dermatology

## 2024-06-22 ENCOUNTER — Encounter: Payer: Self-pay | Admitting: Dermatology

## 2024-06-22 DIAGNOSIS — D485 Neoplasm of uncertain behavior of skin: Secondary | ICD-10-CM

## 2024-06-22 DIAGNOSIS — L209 Atopic dermatitis, unspecified: Secondary | ICD-10-CM

## 2024-06-22 DIAGNOSIS — Z7189 Other specified counseling: Secondary | ICD-10-CM

## 2024-06-22 DIAGNOSIS — Z79899 Other long term (current) drug therapy: Secondary | ICD-10-CM

## 2024-06-22 MED ORDER — CLOBETASOL PROPIONATE 0.05 % EX CREA: TOPICAL_CREAM | CUTANEOUS | 2 refills | Status: DC

## 2024-06-22 NOTE — Patient Instructions (Addendum)
 Start Clobetasol cream twice daily to affected areas as needed for rash/itching. Avoid applying to face, groin, and axilla. Use as directed. Long-term use can cause thinning of the skin.  Topical steroids (such as triamcinolone, fluocinolone, fluocinonide, mometasone, clobetasol, halobetasol, betamethasone, hydrocortisone) can cause thinning and lightening of the skin if they are used for too long in the same area. Your physician has selected the right strength medicine for your problem and area affected on the body. Please use your medication only as directed by your physician to prevent side effects.     Due to recent changes in healthcare laws, you may see results of your pathology and/or laboratory studies on MyChart before the doctors have had a chance to review them. We understand that in some cases there may be results that are confusing or concerning to you. Please understand that not all results are received at the same time and often the doctors may need to interpret multiple results in order to provide you with the best plan of care or course of treatment. Therefore, we ask that you please give us  2 business days to thoroughly review all your results before contacting the office for clarification. Should we see a critical lab result, you will be contacted sooner.   If You Need Anything After Your Visit  If you have any questions or concerns for your doctor, please call our main line at [phone] and press option 4 to reach your doctor's medical assistant. If no one answers, please leave a voicemail as directed and we will return your call as soon as possible. Messages left after 4 pm will be answered the following business day.   You may also send us  a message via MyChart. We typically respond to MyChart messages within 1-2 business days.  For prescription refills, please ask your pharmacy to contact our office. Our fax number is [phone].  If you have an urgent issue when the  clinic is closed that cannot wait until the next business day, you can page your doctor at the number below.    Please note that while we do our best to be available for urgent issues outside of office hours, we are not available 24/7.   If you have an urgent issue and are unable to reach us , you may choose to seek medical care at your doctor's office, retail clinic, urgent care center, or emergency room.  If you have a medical emergency, please immediately call 911 or go to the emergency department.  Pager Numbers  - Dr. Hester: [phone]  - Dr. Jackquline: [phone]  - Dr. Claudene: [phone]   - Dr. Raymund: [phone]  In the event of inclement weather, please call our main line at [phone] for an update on the status of any delays or closures.  Dermatology Medication Tips: Please keep the boxes that topical medications come in in order to help keep track of the instructions about where and how to use these. Pharmacies typically print the medication instructions only on the boxes and not directly on the medication tubes.   If your medication is too expensive, please contact our office at [phone] option 4 or send us  a message through MyChart.   We are unable to tell what your co-pay for medications will be in advance as this is different depending on your insurance coverage. However, we may be able to find a substitute medication at lower cost or fill out paperwork to get insurance to cover a needed medication.   If a  prior authorization is required to get your medication covered by your insurance company, please allow us  1-2 business days to complete this process.  Drug prices often vary depending on where the prescription is filled and some pharmacies may offer cheaper prices.  The website www.goodrx.com contains coupons for medications through different pharmacies. The prices here do not account for what the cost may be with help from insurance (it may be cheaper  with your insurance), but the website can give you the price if you did not use any insurance.  - You can print the associated coupon and take it with your prescription to the pharmacy.  - You may also stop by our office during regular business hours and pick up a GoodRx coupon card.  - If you need your prescription sent electronically to a different pharmacy, notify our office through [facility] or by phone at [phone] option 4.     Si Usted Necesita Algo Despus de Su Visita  Tambin puede enviarnos un mensaje a travs de Clinical Cytogeneticist. Por lo general respondemos a los mensajes de MyChart en el transcurso de 1 a 2 das hbiles.  Para renovar recetas, por favor pida a su farmacia que se ponga en contacto con nuestra oficina. Randi lakes de fax es [place] [phone].  Si tiene un asunto urgente cuando la clnica est cerrada y que no puede esperar hasta el siguiente da hbil, puede llamar/localizar a su doctor(a) al nmero que aparece a continuacin.   Por favor, tenga en cuenta que aunque hacemos todo lo posible para estar disponibles para asuntos urgentes fuera del horario de [place], no estamos disponibles las 24 horas del da, los 7 [address]  [address] de la [place].   Si tiene un problema urgente y no puede comunicarse con nosotros, puede optar por buscar atencin mdica  en el consultorio de su doctor(a), en una clnica privada, en un centro de atencin urgente o en una sala de emergencias.  Si tiene engineer, drilling, por favor llame inmediatamente al 911 o vaya a la sala de emergencias.  Nmeros de bper  - Dr. Hester: [phone]  - Dra. Jackquline: [phone]  - Dr. Claudene: [phone]  - Dra. Kitts: [phone]  En caso de inclemencias del [place], por favor llame a nuestra lnea principal al [phone] para una actualizacin sobre el estado de cualquier retraso o cierre.  Consejos para la medicacin en dermatologa: Por favor, guarde las cajas en las que vienen los  medicamentos de uso tpico para ayudarle a seguir las instrucciones sobre dnde y cmo usarlos. Las farmacias generalmente imprimen las instrucciones del medicamento slo en las cajas y no directamente en los tubos del [place].   Si su medicamento es muy caro, por favor, pngase en contacto con landry rieger llamando al [phone] y presione la opcin 4 o envenos un mensaje a travs de Clinical Cytogeneticist.   No podemos decirle cul ser su copago por los medicamentos por adelantado ya que esto es diferente dependiendo de la cobertura de su seguro. Sin embargo, es posible que podamos encontrar un medicamento sustituto a audiological scientist un formulario para que el seguro cubra el medicamento que se considera necesario.   Si se requiere una autorizacin previa para que su compaa de seguros cubra su medicamento, por favor permtanos de 1 a 2 das hbiles para completar este proceso.  Los precios de los medicamentos varan con frecuencia dependiendo del environmental consultant de dnde se surte la receta y alguna farmacias pueden ofrecer precios ms baratos.  El  sitio web www.goodrx.com tiene cupones para medicamentos de health and safety inspector. Los precios aqu no tienen en cuenta lo que podra costar con la ayuda del seguro (puede ser ms barato con su seguro), pero el sitio web puede darle el precio si no utiliz tourist information centre manager.  - Puede imprimir el cupn correspondiente y llevarlo con su receta a la farmacia.  - Tambin puede pasar por nuestra oficina durante el horario de atencin regular y education officer, museum una tarjeta de cupones de GoodRx.  - Si necesita que su receta se enve electrnicamente a una farmacia diferente, informe a nuestra oficina a travs de MyChart de [place] o por telfono llamando al [phone] y presione la opcin 4.

## 2024-06-22 NOTE — Progress Notes (Signed)
   New Patient Visit   Subjective  Paul Bullock is a 78 y.o. male who presents for the following: Rash. Upper back, left palm. Itching. PCP prescribed Triamcinolone 0.5% ointment last Wednesday. Helps when first applies. Once dry appears scaly again. States feet are not affected. Denies new hobbies or changes in diet or products.     The following portions of the chart were reviewed this encounter and updated as appropriate: medications, allergies, medical history  Review of Systems:  No other skin or systemic complaints except as noted in HPI or Assessment and Plan.  Objective  Well appearing patient in no apparent distress; mood and affect are within normal limits.  A focused examination was performed of the following areas: Back, left palm   Relevant exam findings are noted in the Assessment and Plan.    Assessment & Plan   ATOPIC DERMATITIS, UNSPECIFIED TYPE   NEOPLASM OF UNCERTAIN BEHAVIOR OF SKIN   COUNSELING AND COORDINATION OF CARE   MEDICATION MANAGEMENT    Atopic Dermatitis, severe, chronic, not at goal  Exam: Scaly pink plaque at left palm, scattered coalescing erythematous slightly scaly patches at back 18% BSA  Atopic dermatitis (eczema) is a chronic, relapsing, pruritic condition that can significantly affect quality of life. It is often associated with allergic rhinitis and/or asthma and can require treatment with topical medications, phototherapy, or in severe cases biologic injectable medication (Dupixent, Adbry, Ebglyss) or oral JAK inhibitors (Rinvoq, Cibinqo).   Treatment Plan:  Start Clobetasol cream twice daily to affected areas as needed for rash/itching. Avoid applying to face, groin, and axilla. Use as directed. Long-term use can cause thinning of the skin. Switch to biologic at follow up if not effective  Topical steroids (such as triamcinolone, fluocinolone, fluocinonide, mometasone, clobetasol, halobetasol, betamethasone, hydrocortisone) can  cause thinning and lightening of the skin if they are used for too long in the same area. Your physician has selected the right strength medicine for your problem and area affected on the body. Please use your medication only as directed by your physician to prevent side effects.   Neoplasm of uncertain behaviour Exam: dark brown macule at back  Treatment Plan: Recommend biopsy. Patient defers biopsy today due to holidays. Will Bx at follow up visit.      Return in about 2 months (around 08/22/2024).  I, Jill Parcell, CMA, am acting as scribe for Boneta Sharps, MD.   Documentation: I have reviewed the above documentation for accuracy and completeness, and I agree with the above.  Boneta Sharps, MD

## 2024-07-08 ENCOUNTER — Other Ambulatory Visit: Payer: Self-pay | Admitting: *Deleted

## 2024-07-08 DIAGNOSIS — D751 Secondary polycythemia: Secondary | ICD-10-CM

## 2024-07-09 ENCOUNTER — Inpatient Hospital Stay

## 2024-07-09 ENCOUNTER — Inpatient Hospital Stay: Attending: Oncology

## 2024-07-09 VITALS — BP 102/53 | HR 60 | Temp 98.1°F | Resp 16

## 2024-07-09 DIAGNOSIS — D751 Secondary polycythemia: Secondary | ICD-10-CM

## 2024-07-09 LAB — CBC WITH DIFFERENTIAL/PLATELET
Abs Immature Granulocytes: 0.02 K/uL (ref 0.00–0.07)
Basophils Absolute: 0.1 K/uL (ref 0.0–0.1)
Basophils Relative: 1 %
Eosinophils Absolute: 0.4 K/uL (ref 0.0–0.5)
Eosinophils Relative: 6 %
HCT: 52.7 % — ABNORMAL HIGH (ref 39.0–52.0)
Hemoglobin: 17.8 g/dL — ABNORMAL HIGH (ref 13.0–17.0)
Immature Granulocytes: 0 %
Lymphocytes Relative: 12 %
Lymphs Abs: 0.8 K/uL (ref 0.7–4.0)
MCH: 32.3 pg (ref 26.0–34.0)
MCHC: 33.8 g/dL (ref 30.0–36.0)
MCV: 95.6 fL (ref 80.0–100.0)
Monocytes Absolute: 0.6 K/uL (ref 0.1–1.0)
Monocytes Relative: 9 %
Neutro Abs: 4.7 K/uL (ref 1.7–7.7)
Neutrophils Relative %: 72 %
Platelets: 106 K/uL — ABNORMAL LOW (ref 150–400)
RBC: 5.51 MIL/uL (ref 4.22–5.81)
RDW: 13.3 % (ref 11.5–15.5)
WBC: 6.5 K/uL (ref 4.0–10.5)
nRBC: 0 % (ref 0.0–0.2)

## 2024-07-09 NOTE — Patient Instructions (Signed)

## 2024-07-09 NOTE — Progress Notes (Signed)
 Paul Bullock presents today for phlebotomy per MD orders. Phlebotomy procedure started at 1325  and ended at 1340. 500 mls  removed. Patient tolerated procedure well. IV needle removed intact.

## 2024-08-04 ENCOUNTER — Encounter: Payer: Self-pay | Admitting: Oncology

## 2024-08-10 ENCOUNTER — Other Ambulatory Visit: Payer: Self-pay

## 2024-08-10 ENCOUNTER — Inpatient Hospital Stay
Admission: EM | Admit: 2024-08-10 | Discharge: 2024-09-06 | DRG: 302 | Disposition: E | Attending: Internal Medicine | Admitting: Internal Medicine

## 2024-08-10 ENCOUNTER — Inpatient Hospital Stay

## 2024-08-10 ENCOUNTER — Encounter: Payer: Self-pay | Admitting: Emergency Medicine

## 2024-08-10 ENCOUNTER — Emergency Department

## 2024-08-10 DIAGNOSIS — I469 Cardiac arrest, cause unspecified: Secondary | ICD-10-CM | POA: Diagnosis present

## 2024-08-10 DIAGNOSIS — N170 Acute kidney failure with tubular necrosis: Secondary | ICD-10-CM | POA: Diagnosis present

## 2024-08-10 DIAGNOSIS — D72829 Elevated white blood cell count, unspecified: Secondary | ICD-10-CM | POA: Diagnosis present

## 2024-08-10 DIAGNOSIS — E875 Hyperkalemia: Secondary | ICD-10-CM | POA: Diagnosis not present

## 2024-08-10 DIAGNOSIS — Z515 Encounter for palliative care: Secondary | ICD-10-CM | POA: Diagnosis not present

## 2024-08-10 DIAGNOSIS — A419 Sepsis, unspecified organism: Secondary | ICD-10-CM | POA: Diagnosis not present

## 2024-08-10 DIAGNOSIS — N184 Chronic kidney disease, stage 4 (severe): Secondary | ICD-10-CM | POA: Diagnosis present

## 2024-08-10 DIAGNOSIS — E1122 Type 2 diabetes mellitus with diabetic chronic kidney disease: Secondary | ICD-10-CM | POA: Diagnosis present

## 2024-08-10 DIAGNOSIS — I255 Ischemic cardiomyopathy: Principal | ICD-10-CM | POA: Diagnosis present

## 2024-08-10 DIAGNOSIS — Z7901 Long term (current) use of anticoagulants: Secondary | ICD-10-CM | POA: Diagnosis not present

## 2024-08-10 DIAGNOSIS — I130 Hypertensive heart and chronic kidney disease with heart failure and stage 1 through stage 4 chronic kidney disease, or unspecified chronic kidney disease: Secondary | ICD-10-CM | POA: Diagnosis present

## 2024-08-10 DIAGNOSIS — D696 Thrombocytopenia, unspecified: Secondary | ICD-10-CM

## 2024-08-10 DIAGNOSIS — J9602 Acute respiratory failure with hypercapnia: Secondary | ICD-10-CM

## 2024-08-10 DIAGNOSIS — D751 Secondary polycythemia: Secondary | ICD-10-CM | POA: Diagnosis present

## 2024-08-10 DIAGNOSIS — J9601 Acute respiratory failure with hypoxia: Secondary | ICD-10-CM | POA: Diagnosis present

## 2024-08-10 DIAGNOSIS — E119 Type 2 diabetes mellitus without complications: Secondary | ICD-10-CM

## 2024-08-10 DIAGNOSIS — I472 Ventricular tachycardia, unspecified: Secondary | ICD-10-CM | POA: Diagnosis present

## 2024-08-10 DIAGNOSIS — Z7985 Long-term (current) use of injectable non-insulin antidiabetic drugs: Secondary | ICD-10-CM

## 2024-08-10 DIAGNOSIS — G931 Anoxic brain damage, not elsewhere classified: Secondary | ICD-10-CM | POA: Diagnosis present

## 2024-08-10 DIAGNOSIS — Z95810 Presence of automatic (implantable) cardiac defibrillator: Secondary | ICD-10-CM

## 2024-08-10 DIAGNOSIS — I252 Old myocardial infarction: Secondary | ICD-10-CM

## 2024-08-10 DIAGNOSIS — K7200 Acute and subacute hepatic failure without coma: Secondary | ICD-10-CM

## 2024-08-10 DIAGNOSIS — Z923 Personal history of irradiation: Secondary | ICD-10-CM

## 2024-08-10 DIAGNOSIS — E874 Mixed disorder of acid-base balance: Secondary | ICD-10-CM | POA: Diagnosis present

## 2024-08-10 DIAGNOSIS — R570 Cardiogenic shock: Secondary | ICD-10-CM | POA: Diagnosis present

## 2024-08-10 DIAGNOSIS — N179 Acute kidney failure, unspecified: Secondary | ICD-10-CM | POA: Diagnosis not present

## 2024-08-10 DIAGNOSIS — Z8249 Family history of ischemic heart disease and other diseases of the circulatory system: Secondary | ICD-10-CM

## 2024-08-10 DIAGNOSIS — I462 Cardiac arrest due to underlying cardiac condition: Secondary | ICD-10-CM | POA: Diagnosis present

## 2024-08-10 DIAGNOSIS — E785 Hyperlipidemia, unspecified: Secondary | ICD-10-CM | POA: Diagnosis present

## 2024-08-10 DIAGNOSIS — Z79899 Other long term (current) drug therapy: Secondary | ICD-10-CM

## 2024-08-10 DIAGNOSIS — Z86711 Personal history of pulmonary embolism: Secondary | ICD-10-CM

## 2024-08-10 DIAGNOSIS — Z888 Allergy status to other drugs, medicaments and biological substances status: Secondary | ICD-10-CM

## 2024-08-10 DIAGNOSIS — Z7951 Long term (current) use of inhaled steroids: Secondary | ICD-10-CM

## 2024-08-10 DIAGNOSIS — Z85828 Personal history of other malignant neoplasm of skin: Secondary | ICD-10-CM

## 2024-08-10 DIAGNOSIS — I5022 Chronic systolic (congestive) heart failure: Secondary | ICD-10-CM | POA: Diagnosis present

## 2024-08-10 DIAGNOSIS — Z823 Family history of stroke: Secondary | ICD-10-CM

## 2024-08-10 DIAGNOSIS — Z66 Do not resuscitate: Secondary | ICD-10-CM | POA: Diagnosis present

## 2024-08-10 DIAGNOSIS — Z7984 Long term (current) use of oral hypoglycemic drugs: Secondary | ICD-10-CM

## 2024-08-10 DIAGNOSIS — E8881 Metabolic syndrome: Secondary | ICD-10-CM | POA: Diagnosis present

## 2024-08-10 DIAGNOSIS — J449 Chronic obstructive pulmonary disease, unspecified: Secondary | ICD-10-CM | POA: Diagnosis present

## 2024-08-10 DIAGNOSIS — Z9221 Personal history of antineoplastic chemotherapy: Secondary | ICD-10-CM

## 2024-08-10 DIAGNOSIS — M96A3 Multiple fractures of ribs associated with chest compression and cardiopulmonary resuscitation: Secondary | ICD-10-CM | POA: Diagnosis present

## 2024-08-10 DIAGNOSIS — I4901 Ventricular fibrillation: Secondary | ICD-10-CM | POA: Diagnosis present

## 2024-08-10 DIAGNOSIS — Z8052 Family history of malignant neoplasm of bladder: Secondary | ICD-10-CM

## 2024-08-10 DIAGNOSIS — Z955 Presence of coronary angioplasty implant and graft: Secondary | ICD-10-CM

## 2024-08-10 DIAGNOSIS — J189 Pneumonia, unspecified organism: Secondary | ICD-10-CM | POA: Diagnosis not present

## 2024-08-10 DIAGNOSIS — E8721 Acute metabolic acidosis: Secondary | ICD-10-CM

## 2024-08-10 DIAGNOSIS — M96A1 Fracture of sternum associated with chest compression and cardiopulmonary resuscitation: Secondary | ICD-10-CM | POA: Diagnosis present

## 2024-08-10 DIAGNOSIS — Z86718 Personal history of other venous thrombosis and embolism: Secondary | ICD-10-CM

## 2024-08-10 DIAGNOSIS — Z8551 Personal history of malignant neoplasm of bladder: Secondary | ICD-10-CM

## 2024-08-10 DIAGNOSIS — F17210 Nicotine dependence, cigarettes, uncomplicated: Secondary | ICD-10-CM | POA: Diagnosis present

## 2024-08-10 DIAGNOSIS — G253 Myoclonus: Secondary | ICD-10-CM | POA: Diagnosis not present

## 2024-08-10 DIAGNOSIS — I2510 Atherosclerotic heart disease of native coronary artery without angina pectoris: Secondary | ICD-10-CM | POA: Diagnosis present

## 2024-08-10 LAB — COMPREHENSIVE METABOLIC PANEL WITH GFR
ALT: 87 U/L — ABNORMAL HIGH (ref 0–44)
AST: 162 U/L — ABNORMAL HIGH (ref 15–41)
Albumin: 3.9 g/dL (ref 3.5–5.0)
Alkaline Phosphatase: 105 U/L (ref 38–126)
Anion gap: 26 — ABNORMAL HIGH (ref 5–15)
BUN: 31 mg/dL — ABNORMAL HIGH (ref 8–23)
CO2: 17 mmol/L — ABNORMAL LOW (ref 22–32)
Calcium: 9.5 mg/dL (ref 8.9–10.3)
Chloride: 98 mmol/L (ref 98–111)
Creatinine, Ser: 2.79 mg/dL — ABNORMAL HIGH (ref 0.61–1.24)
GFR, Estimated: 23 mL/min — ABNORMAL LOW
Glucose, Bld: 182 mg/dL — ABNORMAL HIGH (ref 70–99)
Potassium: 5.9 mmol/L — ABNORMAL HIGH (ref 3.5–5.1)
Sodium: 141 mmol/L (ref 135–145)
Total Bilirubin: 1.7 mg/dL — ABNORMAL HIGH (ref 0.0–1.2)
Total Protein: 6.9 g/dL (ref 6.5–8.1)

## 2024-08-10 LAB — CBC
HCT: 58.5 % — ABNORMAL HIGH (ref 39.0–52.0)
Hemoglobin: 18.1 g/dL — ABNORMAL HIGH (ref 13.0–17.0)
MCH: 32.1 pg (ref 26.0–34.0)
MCHC: 30.9 g/dL (ref 30.0–36.0)
MCV: 103.9 fL — ABNORMAL HIGH (ref 80.0–100.0)
Platelets: 91 K/uL — ABNORMAL LOW (ref 150–400)
RBC: 5.63 MIL/uL (ref 4.22–5.81)
RDW: 12.8 % (ref 11.5–15.5)
WBC: 15 K/uL — ABNORMAL HIGH (ref 4.0–10.5)
nRBC: 0.2 % (ref 0.0–0.2)

## 2024-08-10 LAB — BLOOD GAS, ARTERIAL
Acid-base deficit: 17.1 mmol/L — ABNORMAL HIGH (ref 0.0–2.0)
Bicarbonate: 16.4 mmol/L — ABNORMAL LOW (ref 20.0–28.0)
FIO2: 50 %
MECHVT: 500 mL
Mechanical Rate: 16
O2 Saturation: 97.7 %
PEEP: 5 cmH2O
Patient temperature: 37
pCO2 arterial: 78 mmHg (ref 32–48)
pH, Arterial: <6.95 — CL (ref 7.35–7.45)
pO2, Arterial: 108 mmHg (ref 83–108)

## 2024-08-10 LAB — BASIC METABOLIC PANEL WITH GFR
Anion gap: 29 — ABNORMAL HIGH (ref 5–15)
BUN: 32 mg/dL — ABNORMAL HIGH (ref 8–23)
CO2: 12 mmol/L — ABNORMAL LOW (ref 22–32)
Calcium: 9.1 mg/dL (ref 8.9–10.3)
Chloride: 98 mmol/L (ref 98–111)
Creatinine, Ser: 2.84 mg/dL — ABNORMAL HIGH (ref 0.61–1.24)
GFR, Estimated: 22 mL/min — ABNORMAL LOW
Glucose, Bld: 181 mg/dL — ABNORMAL HIGH (ref 70–99)
Potassium: 5.8 mmol/L — ABNORMAL HIGH (ref 3.5–5.1)
Sodium: 139 mmol/L (ref 135–145)

## 2024-08-10 LAB — PROCALCITONIN: Procalcitonin: 1.48 ng/mL

## 2024-08-10 LAB — PRO BRAIN NATRIURETIC PEPTIDE: Pro Brain Natriuretic Peptide: 4372 pg/mL — ABNORMAL HIGH

## 2024-08-10 LAB — T4, FREE: Free T4: 0.85 ng/dL (ref 0.80–2.00)

## 2024-08-10 LAB — TSH: TSH: 3.04 u[IU]/mL (ref 0.350–4.500)

## 2024-08-10 LAB — TROPONIN T, HIGH SENSITIVITY: Troponin T High Sensitivity: 68 ng/L — ABNORMAL HIGH (ref 0–19)

## 2024-08-10 MED ORDER — SODIUM BICARBONATE 8.4 % IV SOLN
100.0000 meq | Freq: Once | INTRAVENOUS | Status: AC
  Administered 2024-08-10: 100 meq via INTRAVENOUS
  Filled 2024-08-10: qty 50

## 2024-08-10 MED ORDER — EPINEPHRINE 1 MG/10ML IV SOSY
PREFILLED_SYRINGE | INTRAVENOUS | Status: AC | PRN
  Administered 2024-08-10: 0.1 mg via INTRAVENOUS
  Administered 2024-08-10 (×2): 1 mg via INTRAVENOUS

## 2024-08-10 MED ORDER — INSULIN ASPART 100 UNIT/ML IJ SOLN: 0.0000 [IU] | INTRAMUSCULAR | Status: DC

## 2024-08-10 MED ORDER — FENTANYL CITRATE (PF) 50 MCG/ML IJ SOSY: 50.0000 ug | PREFILLED_SYRINGE | Freq: Once | INTRAMUSCULAR | Status: AC

## 2024-08-10 MED ORDER — HEPARIN SODIUM (PORCINE) 5000 UNIT/ML IJ SOLN: 5000.0000 [IU] | Freq: Three times a day (TID) | INTRAMUSCULAR | Status: DC

## 2024-08-10 MED ORDER — POLYETHYLENE GLYCOL 3350 17 G PO PACK: 17.0000 g | PACK | Freq: Every day | ORAL | Status: DC | PRN

## 2024-08-10 MED ORDER — FENTANYL CITRATE (PF) 50 MCG/ML IJ SOSY
PREFILLED_SYRINGE | INTRAMUSCULAR | Status: AC
  Administered 2024-08-10: 50 ug via INTRAVENOUS
  Filled 2024-08-10: qty 1

## 2024-08-10 MED ORDER — CHLORHEXIDINE GLUCONATE CLOTH 2 % EX PADS: 6.0000 | MEDICATED_PAD | Freq: Every day | CUTANEOUS | Status: DC

## 2024-08-10 MED ORDER — SODIUM BICARBONATE 8.4 % IV SOLN
INTRAVENOUS | Status: AC | PRN
  Administered 2024-08-10: 50 meq via INTRAVENOUS

## 2024-08-10 MED ORDER — ETOMIDATE 2 MG/ML IV SOLN
15.0000 mg | Freq: Once | INTRAVENOUS | Status: AC
  Administered 2024-08-10: 15 mg via INTRAVENOUS

## 2024-08-10 MED ORDER — SUCCINYLCHOLINE CHLORIDE 200 MG/10ML IV SOSY: 100.0000 mg | PREFILLED_SYRINGE | Freq: Once | INTRAVENOUS | Status: AC

## 2024-08-10 MED ORDER — PROPOFOL 1000 MG/100ML IV EMUL
0.0000 ug/kg/min | INTRAVENOUS | Status: DC
  Administered 2024-08-10: 10 ug/kg/min via INTRAVENOUS
  Filled 2024-08-10: qty 100

## 2024-08-10 MED ORDER — IPRATROPIUM-ALBUTEROL 0.5-2.5 (3) MG/3ML IN SOLN: 3.0000 mL | Freq: Four times a day (QID) | RESPIRATORY_TRACT | Status: DC | PRN

## 2024-08-10 MED ORDER — MIDAZOLAM-SODIUM CHLORIDE 100-0.9 MG/100ML-% IV SOLN
0.0000 mg/h | INTRAVENOUS | Status: DC
  Administered 2024-08-10: 2 mg/h via INTRAVENOUS
  Filled 2024-08-10: qty 100

## 2024-08-10 MED ORDER — SENNA 8.6 MG PO TABS: 1.0000 | ORAL_TABLET | Freq: Two times a day (BID) | ORAL | Status: DC | PRN

## 2024-08-10 MED ORDER — SUCCINYLCHOLINE CHLORIDE 200 MG/10ML IV SOSY
PREFILLED_SYRINGE | INTRAVENOUS | Status: AC
  Administered 2024-08-10: 100 mg via INTRAVENOUS
  Filled 2024-08-10: qty 10

## 2024-08-10 MED ORDER — ETOMIDATE 2 MG/ML IV SOLN
INTRAVENOUS | Status: AC
  Filled 2024-08-10: qty 10

## 2024-08-10 MED ORDER — AMIODARONE HCL IN DEXTROSE 360-4.14 MG/200ML-% IV SOLN: 30.0000 mg/h | INTRAVENOUS | Status: DC

## 2024-08-10 MED ORDER — NOREPINEPHRINE 4 MG/250ML-% IV SOLN
0.0000 ug/min | INTRAVENOUS | Status: DC
  Administered 2024-08-10: 10 ug/min via INTRAVENOUS
  Administered 2024-08-10 – 2024-08-11 (×3): 40 ug/min via INTRAVENOUS
  Filled 2024-08-10 (×3): qty 250

## 2024-08-10 MED ORDER — IPRATROPIUM-ALBUTEROL 0.5-2.5 (3) MG/3ML IN SOLN: 3.0000 mL | Freq: Four times a day (QID) | RESPIRATORY_TRACT | Status: DC

## 2024-08-10 MED ORDER — FENTANYL 2500MCG IN NS 250ML (10MCG/ML) PREMIX INFUSION
0.0000 ug/h | INTRAVENOUS | Status: DC
  Administered 2024-08-10: 50 ug/h via INTRAVENOUS
  Filled 2024-08-10: qty 250

## 2024-08-10 MED ORDER — VASOPRESSIN 20 UNITS/100 ML INFUSION FOR SHOCK
0.0000 [IU]/min | INTRAVENOUS | Status: DC
  Filled 2024-08-10 (×2): qty 100

## 2024-08-10 MED ORDER — FAMOTIDINE 20 MG PO TABS: 20.0000 mg | ORAL_TABLET | Freq: Two times a day (BID) | ORAL | Status: DC

## 2024-08-10 MED ORDER — AMIODARONE HCL IN DEXTROSE 360-4.14 MG/200ML-% IV SOLN
60.0000 mg/h | INTRAVENOUS | Status: AC
  Administered 2024-08-11 (×2): 60 mg/h via INTRAVENOUS
  Filled 2024-08-10 (×2): qty 200

## 2024-08-10 MED ORDER — FENTANYL BOLUS VIA INFUSION
25.0000 ug | INTRAVENOUS | Status: DC | PRN
  Administered 2024-08-10 – 2024-08-11 (×2): 100 ug via INTRAVENOUS

## 2024-08-10 MED ORDER — AMIODARONE LOAD VIA INFUSION
150.0000 mg | Freq: Once | INTRAVENOUS | Status: AC
  Administered 2024-08-11: 150 mg via INTRAVENOUS
  Filled 2024-08-10: qty 83.34

## 2024-08-10 MED ORDER — MIDAZOLAM BOLUS VIA INFUSION
2.0000 mg | INTRAVENOUS | Status: DC | PRN
  Administered 2024-08-10: 2 mg via INTRAVENOUS

## 2024-08-10 MED ORDER — CALCIUM GLUCONATE-NACL 1-0.675 GM/50ML-% IV SOLN
1.0000 g | Freq: Once | INTRAVENOUS | Status: AC
  Administered 2024-08-10: 1000 mg via INTRAVENOUS
  Filled 2024-08-10: qty 50

## 2024-08-10 NOTE — ED Notes (Signed)
 PULSES LOST, CPR INITIATED, CODE CHARTING STARTED

## 2024-08-10 NOTE — ED Provider Notes (Signed)
 "  [facility] Provider Note    Event Date/Time   First MD Initiated Contact with Patient 08/10/24 2028     (approximate)   History   Loss of Consciousness   HPI  Paul Bullock is a 79 y.o. male brought in as a resuscitated cardiac arrest.  Unclear downtime, found by EMS after neighbor called and found the patient on the ground at home.  He was found in PEA and apneic, EMS was briefly able to get pulse, and then patient arrested again and route.  Upon arrival at our facility he did have pulse initially and then again went back into PEA.  I spoke with patient's daughter and son over the phone who both spoke with the patient most recently at 6:30 PM.  The patient has been complaining of a sore throat, but had told him that he was otherwise feeling well.  He does have an underlying history of COPD.  Apparently earlier in the day his neighbor had urged him to go into the emergency department for assessment and evaluation.     Physical Exam   Triage Vital Signs: ED Triage Vitals  Encounter Vitals Group     BP 08/10/24 2032 (!) 167/122     Girls Systolic BP Percentile --      Girls Diastolic BP Percentile --      Boys Systolic BP Percentile --      Boys Diastolic BP Percentile --      Pulse Rate 08/10/24 2032 (!) 141     Resp 08/10/24 2032 15     Temp --      Temp src --      SpO2 08/10/24 2032 95 %     Weight 08/10/24 2037 278 lb 6.4 oz (126.3 kg)     Height --      Head Circumference --      Peak Flow --      Pain Score --      Pain Loc --      Pain Education --      Exclude from Growth Chart --     Most recent vital signs: Vitals:   08/10/24 2155 08/10/24 2200  BP: (!) 90/58 (!) 90/53  Pulse: 77 77  Resp: 17 16  Temp: 99 F (37.2 C) 99 F (37.2 C)  SpO2: 94% 93%     General: Unresponsive CV:  Palpable femoral pulse Resp:  Apneic Abd:  No distention.  Soft Eye:  Pupils bilaterally dilated and unresponsive to light Other:     ED  Results / Procedures / Treatments   Labs (all labs ordered are listed, but only abnormal results are displayed) Labs Reviewed  CBC - Abnormal; Notable for the following components:      Result Value   WBC 15.0 (*)    Hemoglobin 18.1 (*)    HCT 58.5 (*)    MCV 103.9 (*)    Platelets 91 (*)    All other components within normal limits  BASIC METABOLIC PANEL WITH GFR - Abnormal; Notable for the following components:   Potassium 5.8 (*)    CO2 12 (*)    Glucose, Bld 181 (*)    BUN 32 (*)    Creatinine, Ser 2.84 (*)    GFR, Estimated 22 (*)    Anion gap 29 (*)    All other components within normal limits  BLOOD GAS, ARTERIAL - Abnormal; Notable for the following components:   pH, Arterial <6.95 (*)  pCO2 arterial 78 (*)    Bicarbonate 16.4 (*)    Acid-base deficit 17.1 (*)    All other components within normal limits  TROPONIN T, HIGH SENSITIVITY - Abnormal; Notable for the following components:   Troponin T High Sensitivity 68 (*)    All other components within normal limits  MRSA NEXT GEN BY PCR, NASAL  CULTURE, BLOOD (ROUTINE X 2)  CULTURE, BLOOD (ROUTINE X 2)  RESPIRATORY PANEL BY PCR  RESP PANEL BY RT-PCR (RSV, FLU A&B, COVID)  RVPGX2  PRO BRAIN NATRIURETIC PEPTIDE  LACTIC ACID, PLASMA  LACTIC ACID, PLASMA  COMPREHENSIVE METABOLIC PANEL WITH GFR  D-DIMER, QUANTITATIVE  LEGIONELLA PNEUMOPHILA SEROGP 1 UR AG  STREP PNEUMONIAE URINARY ANTIGEN  URINALYSIS, COMPLETE (UACMP) WITH MICROSCOPIC  PHOSPHORUS  MAGNESIUM   BASIC METABOLIC PANEL WITH GFR  CBC  BLOOD GAS, ARTERIAL  PROCALCITONIN  TSH  T4, FREE     EKG  My independent interpretation of this EKG demonstrates a irregularly irregular rhythm with rate of about 150, axis of about 110, QRS appears to be widened, but remaining intervals appear to be within normal limits, no obvious ischemia noted on this EKG   RADIOLOGY On my independent interpretation of this chest x-ray, endotracheal tube appears to be about  2 to 3 cm above the carina in appropriate position, gastric tube also in appropriate position.  No acute cardiopulmonary findings are appreciated  PROCEDURES:  Critical Care performed: Yes, see critical care procedure note(s)  CRITICAL CARE Performed by: Rossie CHRISTELLA Bathe   Total critical care time: 65 minutes  Critical care time was exclusive of separately billable procedures and treating other patients.  Critical care was necessary to treat or prevent imminent or life-threatening deterioration.  Critical care was time spent personally by me on the following activities: development of treatment plan with patient and/or surrogate as well as nursing, discussions with consultants, evaluation of patient's response to treatment, examination of patient, obtaining history from patient or surrogate, ordering and performing treatments and interventions, ordering and review of laboratory studies, ordering and review of radiographic studies, pulse oximetry and re-evaluation of patient's condition.  Procedure Name: Intubation Date/Time: 08/10/2024 9:18 PM  Performed by: Bathe Rossie CHRISTELLA, MDPre-anesthesia Checklist: Patient identified, Emergency Drugs available and Suction available Oxygen Delivery Method: Ambu bag Preoxygenation: Pre-oxygenation with 100% oxygen Induction Type: Rapid sequence Ventilation: Mask ventilation without difficulty Laryngoscope Size: 4 and Glidescope Grade View: Grade I Tube size: 7.5 mm Number of attempts: 1 Airway Equipment and Method: Stylet and Video-laryngoscopy Placement Confirmation: CO2 detector, ETT inserted through vocal cords under direct vision, Positive ETCO2 and Breath sounds checked- equal and bilateral Secured at: 23 cm Tube secured with: ETT holder       MEDICATIONS ORDERED IN ED: Medications  norepinephrine  (LEVOPHED ) 4mg  in (0.016 mg/mL) premix infusion (40 mcg/min Intravenous Rate/Dose Change 08/10/24 2154)  Chlorhexidine  Gluconate Cloth 2 %  PADS 6 each (has no administration in time range)  polyethylene glycol (MIRALAX  / GLYCOLAX ) packet 17 g (has no administration in time range)  senna (SENOKOT) tablet 8.6 mg (has no administration in time range)  heparin  injection 5,000 Units (has no administration in time range)  famotidine  (PEPCID ) tablet 20 mg (has no administration in time range)  ipratropium-albuterol  (DUONEB) 0.5-2.5 (3) MG/3ML nebulizer solution 3 mL (has no administration in time range)  ipratropium-albuterol  (DUONEB) 0.5-2.5 (3) MG/3ML nebulizer solution 3 mL (has no administration in time range)  insulin  aspart (novoLOG ) injection 0-9 Units (has no administration in time range)  fentaNYL  in NS (74mcg/ml) infusion-PREMIX (50 mcg/hr Intravenous New Bag/Given 08/10/24 2240)  fentaNYL  (SUBLIMAZE ) bolus via infusion 25-100 mcg (has no administration in time range)  midazolam  (VERSED ) 100 mg/100 mL (1 mg/mL) premix infusion (2 mg/hr Intravenous New Bag/Given 08/10/24 2243)  midazolam  (VERSED ) bolus via infusion 2 mg (has no administration in time range)  calcium  gluconate 1 g/ 50 mL sodium chloride  IVPB (has no administration in time range)  EPINEPHrine  (ADRENALIN ) 1 MG/10ML injection (0.1 mg Intravenous Given 08/10/24 2045)  sodium bicarbonate  injection (50 mEq Intravenous Given 08/10/24 2028)  etomidate  (AMIDATE ) injection 15 mg (15 mg Intravenous Given 08/10/24 2044)  succinylcholine  (ANECTINE ) syringe 100 mg (100 mg Intravenous Given 08/10/24 2045)  fentaNYL  (SUBLIMAZE ) injection 50 mcg (50 mcg Intravenous Given 08/10/24 2218)  sodium bicarbonate  injection 100 mEq (100 mEq Intravenous Given 08/10/24 2244)     IMPRESSION / MDM / ASSESSMENT AND PLAN / ED COURSE  I reviewed the triage vital signs and the nursing notes.                               Patient's presentation is most consistent with acute presentation with potential threat to life or bodily function.  79 year old male who presents today for concern of  cardiac arrest.  Unknown downtime, found in PEA, brief ROSC prior to recurrent arrest.  Here we were able to get ROSC again, arrest suspected secondary to respiratory failure, unfortunately pupils dilated nonreactive to light and no signs of any response to painful stimuli here at this time.  I am going to reach out to family to discuss the prognosis given unknown downtime and the initial rhythm of PEA.   Clinical Course as of 08/10/24 2246  Mon Aug 10, 2024  2110 I spoke with the patient's ex wife and then separately with the daughter and the son.  Patient's ex-wife will come to bedside and then we will convene with her daughter and son to determine definitive management.  Until then they are requesting patient be kept full code.  I returned to the bedside, patient did demonstrate 1 brief gag while not on any sedation, will go ahead initiate propofol  and Levophed . [SK]  2214 Patient does seem to be breathing spontaneously on his own and having some signs of discomfort. Spoke with ICU, Almarie, who has agreed to evaluate the patient to determine course of further medical management. [SK]    Clinical Course User Index [SK] Fernand Rossie HERO, MD     FINAL CLINICAL IMPRESSION(S) / ED DIAGNOSES   Final diagnoses:  Cardiac arrest [facility])     Rx / DC Orders   ED Discharge Orders     None        Note:  This document was prepared using Dragon voice recognition software and may include unintentional dictation errors.   Fernand Rossie HERO, MD 08/10/24 2246  "

## 2024-08-10 NOTE — H&P (Signed)
 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 Later, while being assisted to the bathroom, he became unresponsive and fell. Patient's neighbor called EMS who found him pulseless with dilated pupils; CPR initiated at 1950, 4 rounds of CPR +2 Epi given before ROSC gained. Pt lost ROSC when arrived to truck, CPR initiated again +1 Epi, ROSC re-gained in route to ED.    ED Course: Pt arrived s/p OHCA with pulses, I-gel in place and IO access. Shortly after arrival, pt became pulseless ? CPR initiated x ~6 min with 2 mg epi ? ROSC achieved. Airway transitioned from I-gel to ETT. Initial VS notable for (BP ~167/122), HR ~85, RR ~141, SpO? ~95% on MV, T 98.46F. Required ventilatory and vasopressor support.  Labs significant for severe mixed acidosis on ABG (pH <6.95 / pCO? 78 / HCO? 16), lactic acidosis, hyperkalemia (K 5.8-5.9), AKI (Cr 2.8), leukocytosis (WBC 15), polycythemia (Hgb 18.1/Hct 58.5), thrombocytopenia (Plt 91), transaminitis (AST 162/ALT 87), and elevated PCT (1.48).  Imaging: CXR: ETT/OGT in place, cardiomegaly, no acute pulmonary process. CT head: no acute hemorrhage; early diffuse loss of gray-white differentiation c/f hypoxic-ischemic injury. CT C/A/P: no acute intra-abdominal or intrathoracic pathology; multiple bilateral anterior rib fractures and probable nondisplaced  sternal fx; no PTX.  PCCM consulted for admission  Past Medical History  COPD: Still smoking DVT/PE Chronic systolic CHF: Ischemic cardiomyopathy.  Medtronic ICD from 4/16.  Echo (3/23): EF 20-25% RHC (3/23): mean RA 25, PA 62/47, mean PCWP 39, CI 2.03.  Echo (10/23): EF 30-35%, low normal RV function.  most recent Echo (12/24): EF 25-30%, normal RV, no MR, normal IVC. Echo in 12/24 showed EF 25-30%, normal RV, no MR, normal IVC.  NYHA class II,  CAD: cardiac arrest complicated by MI with OM2 stent in 2002.  NSTEMI 3/23 with DES x 2 to proximal and mid LAD.  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  Bladder cancer 2012: Chemoradiation.  T2DM Polycythemia: Likely due to chronic smoking, JAK2 mutation negative. 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 Hyperlipidemia NSVT  Significant Hospital Events   1/5:Admit to ICU with OOH PEA Cardiac Arrest  Consults:  Cardiology Nephrology  Procedures:  1/5: ETT  Interim History / Subjective:    -see significant events  Micro Data:  1/5: SARS-CoV-2 PCR>  1/5: Influenza PCR>  1/5: Respiratory Viral Panel> 1/5: Blood culture x2> 1/5: MRSA PCR>>  1/5: Strep pneumo Ur Ag 1/5: Legionella Ur Ag>  Antimicrobials:  Ceftriaxone  Azithromycin   OBJECTIVE  Blood pressure (!) 83/48, pulse 85, temperature 98.5 F (36.9 C), resp. rate (!) 23, height 5' 6 (1.676 m), weight 126.3 kg, SpO2 95%.    Vent Mode: PRVC FiO2 (%):  [40 %-100 %] 100 % Set Rate:  [16 bmp-24 bmp] 24 bmp Vt Set:  [500 mL] 500 mL PEEP:  [5 cmH20-8 cmH20] 8 cmH20  No intake or output data in the 24 hours ending 08/31/24 0036 Filed Weights   08/10/24 2037  Weight: 126.3 kg  Physical Examination  GEN: Critically ill patient, intubated and sedated HEENT: [place]/AT. PERRL, sclerae anicteric. HEART: regular rhythm, normal rate, S1, S2, no M/R/G,  LUNGS: CTAB, mild crackles without wheezes, no increased WOB,  EXTREMITIES: traced bilateral lower extremities No  Edema, NEURO: Generalized myclonic jerking movements. absent corneal and pupil reflexes, cough and gag present, pupil dilated and non reactive no other focal neurological deficit PSYCH: UTA, intubated and sedated ABDOMINAL: Soft: BS x 4, NTND SKIN: Intact, warm, no rashes lesion, or ulcer  Active Hospital Problem list   See systems below  Assessment & Plan:  #Out-of-Hospital Cardiac Arrest (PEA) unknown downtime, PEA rhythm, multiple ROSC episodes. Likely multifactorial etiology iso Advanced ischemic cardiomyopathy, Hypoxia/hypercapnia from COPD, Metabolic derangements (severe acidosis, hyperkalemia) PMHx: Ischemic cardiomyopathy (EF 25-30%, ICD), chronic systolic CHF (NYHA II), CAD with prior MI/stents (OM2 2002, LAD DES 3/23)   CT pan-scan neg for PE -avoid hypercapnia -trend Lactate and troponin -Pressor for MAP goal >65-75 -TTM: northothermia -Start statin, Aspirin  and Heparin  per ACS protocol -Will start Amiodarone  gtt -Obtain 2D Echo -Cardiology consult, ICD interrogated by Medtronic showing VFib with successful shock, see documentation, discussed with Dr. Rolan cardiologist who reviewed interrogation sheet and advised to start Amiodarone  gtt.  #Shock - Mixed Cardiogenic  Vasoplegic likely post-cardiac arrest and acidosis worsened by severe HFrEF and ischemic cardiomyopathy. Hypotension with EF 25-30% and prior low CI -Maintain MAP >=65 mmHg -Avoid recurrent hypotension/hypoxia -Trend lactate, ABGs, CMP  #Acute Hypoxic & Hypercapnic Respiratory Failure #CAP PMHx:COPD (active smoker)  -con't full mechanical support 6-8cc/kg/Vt  -titrate FiO2, PEEP to maintain O2 sat >90% cautions given cardiomyopathy -Lung protective ventilation  -PRN Chest X-ray & ABG -PRN and scheduled bronchodilators -SAT/SBT when appropriate  -prn fentanyl , prop for RASS -1    #Sepsis  #?CAP Leukocytosis and elevated procalcitonin likely post-arrest inflammatory response -F/u cultures, trend  lactic/ PCT -Monitor WBC/ fever curve -Obtain MRSA nasal swab, RVP, legionella, strep urine Ag -IV antibiotics Ceftriaxone  AND Azithromycin  -Gentle IVF hydration as needed -Pressors PRN for MAP goal >65 -Strict I/O's  #Acute Kidney Injury #Acute Metabolic Acidosis / Hyperkalemia Cr 2.8, CrCl ~28 mL/min Likely ATN from prolonged hypotension -Serial ABGs/BMPs -Avoid nephrotoxic  -Give 2 amps of sodium Bicarb and start sodium bicarb gtt -Replace lytes -strict I&O -Nephrology consult for CRRT if refractory acidosis, hyperkalemia, or oliguria  #Suspected Hypoxic-Ischemic Encephalopathy CT head: early diffuse loss of gray-white differentiation -Avoid hyperthermia -Serial neurologic exams -EEG if concern for seizures -Prognostication deferred >=72 hrs post-ROSC  #Hepatic Injury (Shock Liver) -Elevated AST/ALT -Trend LFTs  #Hematologic Issues Chronic thrombocytopeniaPolycythemia (smoking-related) DVT/PE -Hold therapeutic anticoagulation initially due to trauma and thrombocytopenia -Resume prophylaxis when safe  #Traumatic Injuries from CPR CT: bilateral anterior rib fractures and nondisplaced sternal fracture -bleeding risk -Multimodal analgesia, avoid hypotension  #T2DM (A1c 6.3) -CBG's q4hrs  -SSI -Target CBG readings 140 to 180 -Follow hypo/hyperglycemic protocol  -Hold SGLT2 inhibitor during shock/AKI  Best practice:  Diet:  NPO Pain/Anxiety/Delirium protocol (if indicated): Yes (RASS goal -1) VAP protocol (if indicated): Yes DVT prophylaxis: Subcutaneous Heparin  GI prophylaxis: H2B Glucose control:  SSI Yes Central venous access:  no Arterial line:  no Foley:  Yes, and it is still needed Mobility:  bed rest  PT/OT consulted: N/A Code Status:  DNR Disposition: ICU   = Goals of Care =  Primary Emergency Contact: Flemmer,Jennifer   Labs/imaging that I havepersonally reviewed  (right click and Reselect all SmartList Selections daily)   CT CHEST ABDOMEN  PELVIS WO CONTRAST Result  Date: 08-23-2024 CLINICAL DATA:  Unknown down time, cardiac arrest EXAM: CT CHEST, ABDOMEN AND PELVIS WITHOUT CONTRAST TECHNIQUE: Multidetector CT imaging of the chest, abdomen and pelvis was performed following the standard protocol without IV contrast. RADIATION DOSE REDUCTION: This exam was performed according to the departmental dose-optimization program which includes automated exposure control, adjustment of the mA and/or kV according to patient size and/or use of iterative reconstruction technique. COMPARISON:  Radiograph 08/10/2024, CT 05/11/2022 FINDINGS: CT CHEST FINDINGS Cardiovascular: Limited assessment without intravenous contrast. Advanced aortic atherosclerosis. Partially visualized cardiac pacing device. Cardiomegaly. No pericardial effusion. Multi-vessel coronary vascular calcification. Mediastinum/Nodes: Endotracheal tube tip several cm superior to carina. No suspicious lymph nodes. No thyroid mass. Enteric tube tip terminates in the GE junction region. Lungs/Pleura: No acute airspace disease, pleural effusion, or pneumothorax. Mild bilateral bronchial wall thickening. Atelectasis or scarring at the bases. Musculoskeletal: Probable nondisplaced fracture involving the upper sternum. Multiple acute bilateral anterior rib fractures. CT ABDOMEN PELVIS FINDINGS Hepatobiliary: No focal liver abnormality is seen. No gallstones, gallbladder wall thickening, or biliary dilatation. Pancreas: Unremarkable. No pancreatic ductal dilatation or surrounding inflammatory changes. Spleen: Normal in size without focal abnormality. Adrenals/Urinary Tract: Adrenal glands are normal. Kidneys show no hydronephrosis. Nonspecific perinephric fat stranding. Indeterminate slightly hyperdense 19 mm midpole lesion on the left, series 2, image 78. Bladder contains Foley catheter and is decompressed Stomach/Bowel: Enteric tube tip in the GE junction region. Moderate air distension of stomach. No  dilated small bowel. No acute bowel wall thickening. Negative appendix. Linear metallic small object in the right colon. Mild diverticular disease of the left colon. Vascular/Lymphatic: Aortic atherosclerosis. No enlarged abdominal or pelvic lymph nodes. Reproductive: Prostate is unremarkable. Other: No ascites. No free air. Small fat containing inguinal hernias. Musculoskeletal: No acute osseous abnormality IMPRESSION: 1. No CT evidence for acute intrathoracic, intra-abdominal, or intrapelvic abnormality. 2. Probable nondisplaced fracture involving the upper sternum. Multiple acute bilateral anterior rib fractures. No evidence of a pneumothorax. 3. High-riding enteric tube tip in the region of GE junction, suggest further advancement for more optimal positioning. 4. Indeterminate 19 mm slightly hyperdense midpole lesion on the left. Consider initial evaluation with nonemergent renal ultrasound. 5. 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 Aortic Atherosclerosis (ICD10-I70.0). Electronically Signed   By: Luke Bun M.D.   On: 08/23/24 00:18   CT Head Wo Contrast Result Date: August 23, 2024 CLINICAL DATA:  Cardiac arrest unclear downtime EXAM: CT HEAD WITHOUT CONTRAST TECHNIQUE: Contiguous axial images were obtained from the base of the skull through the vertex without intravenous contrast. RADIATION DOSE REDUCTION: This exam was performed according to the departmental dose-optimization program which includes automated exposure control, adjustment of the mA and/or kV according to patient size and/or use of iterative reconstruction technique. COMPARISON:  None Available. FINDINGS: Brain: No acute hemorrhage or intracranial mass. Suspect that there may be early or subtle loss of gray-white matter differentiation. The ventricles are nonenlarged. No midline shift. Vascular: Generalized increased density of the venous sinuses and intracranial vessels Skull: No fracture Sinuses/Orbits: Mucosal  thickening in the sinuses. Fluid within the nasopharynx. Other: None IMPRESSION: 1. Negative for acute intracranial hemorrhage. 2. Suspect that there may be early or subtle diffuse loss of gray-white matter differentiation, raising concern for early changes of diffuse hypoxic ischemic injury. Electronically Signed   By: Luke Bun M.D.   On: 23-Aug-2024 00:10   DG Abd Portable 1 View Result Date: 08/10/2024 EXAM: 1 VIEW XRAY OF THE ABDOMEN 08/10/2024 11:17:00 PM COMPARISON: Comparison 08/10/2024. CLINICAL HISTORY:  POST OG FINDINGS: LINES, TUBES AND DEVICES: OG tube tip is at the GE junction, slightly retracted since the prior study. BOWEL: Gaseous distention of the stomach. SOFT TISSUES: No abnormal calcifications. BONES: No acute fracture. IMPRESSION: 1. NG tube tip at the GE junction, slightly retracted compared with the prior study. Recommend advancing several cm into the stomach. 2. Gaseous distention of the stomach. Electronically signed by: Franky Crease MD 08/10/2024 11:28 PM EST RP Workstation: [id number]   DG Chest Port 1 View Result Date: 08/10/2024 EXAM: 1 VIEW(S) XRAY OF THE CHEST 08/10/2024 09:02:00 PM COMPARISON: None available. CLINICAL HISTORY: Intubation of airway performed without difficulty. FINDINGS: LINES, TUBES AND DEVICES: Endotracheal tube in place with tip 3.5 cm above the carina. Enteric tube in place with tip below the diaphragm overlying the expected region of the gastric lumen. Left chest wall ICD noted. LUNGS AND PLEURA: No focal pulmonary opacity. No pleural effusion. No pneumothorax. HEART AND MEDIASTINUM: Cardiomegaly. Atherosclerotic plaque noted. BONES AND SOFT TISSUES: No acute osseous abnormality. STOMACH AND BOWEL: Gaseous distension of stomach. IMPRESSION: 1. Endotracheal tube and enteric tube in appropriate positions. 2. No acute cardiopulmonary findings. 3. Gaseous distension of the stomach. Electronically signed by: Elsie Gravely MD 08/10/2024 09:10 PM EST RP  Workstation: [id number]   DG Abd Portable 1 View Result Date: 08/10/2024 EXAM: 1 VIEW XRAY OF THE ABDOMEN 08/10/2024 09:02:00 PM COMPARISON: None available. CLINICAL HISTORY: POST OG FINDINGS: LINES, TUBES AND DEVICES: Enteric tube in place with tip at the proximal stomach. Cardiac pad noted overlying left base. BOWEL: Gas distended stomach. SOFT TISSUES: No abnormal calcifications. BONES: No acute fracture. HEART: Cardiomegaly. IMPRESSION: 1. Enteric tube tip projects over the proximal stomach. 2. Gas distended stomach. Electronically signed by: Elsie Gravely MD 08/10/2024 09:08 PM EST RP Workstation: [id number]    Labs   CBC: Recent Labs  Lab 08/10/24 2104  WBC 15.0*  HGB 18.1*  HCT 58.5*  MCV 103.9*  PLT 91*   Basic Metabolic Panel: Recent Labs  Lab 08/10/24 2104  NA 141  139  K 5.9*  5.8*  CL 98  98  CO2 17*  12*  GLUCOSE 182*  181*  BUN 31*  32*  CREATININE 2.79*  2.84*  CALCIUM  9.5  9.1   GFR: Estimated Creatinine Clearance: 27.8 mL/min (A) (by C-G formula based on SCr of 2.84 mg/dL (H)). Recent Labs  Lab 08/10/24 2104  PROCALCITON 1.48  WBC 15.0*   Liver Function Tests: Recent Labs  Lab 08/10/24 2104  AST 162*  ALT 87*  ALKPHOS 105  BILITOT 1.7*  PROT 6.9  ALBUMIN 3.9   No results for input(s): LIPASE, AMYLASE in the last 168 hours. No results for input(s): AMMONIA in the last 168 hours.  ABG    Component Value Date/Time   PHART <6.95 (LL) 08/10/2024 2127   PCO2ART 78 (HH) 08/10/2024 2127   PO2ART 108 08/10/2024 2127   HCO3 16.4 (L) 08/10/2024 2127   ACIDBASEDEF 17.1 (H) 08/10/2024 2127   O2SAT 97.7 08/10/2024 2127   Coagulation Profile: No results for input(s): INR, PROTIME in the last 168 hours.  Cardiac Enzymes: No results for input(s): CKTOTAL, CKMB, CKMBINDEX, TROPONINI in the last 168 hours.  HbA1C: Hgb A1c MFr Bld  Date/Time Value Ref Range Status  06/17/2024 09:52 AM 6.3 (H) 4.8 - 5.6 % Final    Comment:     (NOTE) Diagnosis of Diabetes The following HbA1c ranges recommended by the American Diabetes Association (ADA) may be used as an aid in  the diagnosis of diabetes mellitus.  Hemoglobin             Suggested A1C NGSP%              Diagnosis  <5.7                   Non Diabetic  5.7-6.4                Pre-Diabetic  >6.4                   Diabetic  <7.0                   Glycemic control for                       adults with diabetes.    12/25/2023 10:16 AM 6.4 (H) 4.8 - 5.6 % Final    Comment:             Prediabetes: 5.7 - 6.4          Diabetes: >6.4          Glycemic control for adults with diabetes: <7.0     CBG: No results for input(s): GLUCAP in the last 168 hours.  Review of Systems:   Unable to be obtained secondary to the patient's intubated and sedated status.   Past Medical History  He,  has a past medical history of AICD (automatic cardioverter/defibrillator) present, Bladder cancer (HCC), CAD (coronary artery disease), Chronic HFrEF (heart failure with reduced ejection fraction) (HCC), COPD (chronic obstructive pulmonary disease) (HCC), Depression, Diverticulitis, DVT (deep venous thrombosis) (HCC), Essential hypertension, Hyperlipidemia LDL goal <70, Ischemic cardiomyopathy, MI (myocardial infarction) (HCC), Migraines, Morbid obesity (HCC), Pulmonary embolism (unprovoked), Squamous cell carcinoma of skin (12/28/2019), Thrombocytopenia, Tobacco abuse, and Varicose veins of left lower extremity.   Surgical History    Past Surgical History:  Procedure Laterality Date   CARDIAC DEFIBRILLATOR PLACEMENT     COLONOSCOPY N/A 02/05/2024   Procedure: COLONOSCOPY;  Surgeon: Therisa Bi, MD;  Location[facility] ENDOSCOPY;  Service: Gastroenterology;  Laterality: N/A;   CORONARY STENT INTERVENTION N/A 10/18/2021   Procedure: CORONARY STENT INTERVENTION;  Surgeon: Darron Deatrice LABOR, MD;  Location: [facility] INVASIVE CV LAB;  Service: Cardiovascular;  Laterality: N/A;  Prox and Mid  LAD   HEMOSTASIS CLIP PLACEMENT  02/05/2024   Procedure: CONTROL OF HEMORRHAGE, GI TRACT, ENDOSCOPIC, BY CLIPPING OR OVERSEWING;  Surgeon: Therisa Bi, MD;  Location: [facility] ENDOSCOPY;  Service: Gastroenterology;;   MOHS SURGERY     PERCUTANEOUS CORONARY STENT INTERVENTION (PCI-S)     POLYPECTOMY  02/05/2024   Procedure: POLYPECTOMY, INTESTINE;  Surgeon: Therisa Bi, MD;  Location: [facility] ENDOSCOPY;  Service: Gastroenterology;;   RIGHT/LEFT HEART CATH AND CORONARY ANGIOGRAPHY N/A 10/13/2021   Procedure: RIGHT/LEFT HEART CATH AND CORONARY ANGIOGRAPHY;  Surgeon: Darron Deatrice LABOR, MD;  Location: [facility] INVASIVE CV LAB;  Service: Cardiovascular;  Laterality: N/A;   VASECTOMY     VASECTOMY REVERSAL       Social History   reports that he has been smoking cigarettes. He has a 41.3 pack-year smoking history. He has never used smokeless tobacco. He reports that he does not currently use alcohol . He reports that he does not use drugs.   Family History   His family history includes Bladder Cancer in his father; Heart attack in his brother; Stroke in his mother.   Allergies Allergies[1]   Home Medications  Prior to Admission  medications  Medication Sig Start Date End Date Taking? Authorizing Provider  albuterol  (PROVENTIL ) (2.5 MG/3ML) 0.083% nebulizer solution Take 3 mLs (2.5 mg total) by nebulization every 6 (six) hours as needed for wheezing or shortness of breath. 12/25/23 12/19/24 Yes Lovie Clarity, MD  bisoprolol  (ZEBETA ) 10 MG tablet TAKE 1 TABLET DAILY (CHANGE IN DOSAGE OR PILL SIZE) 12/24/23  Yes Rolan Ezra RAMAN, MD  Cholecalciferol  (VITAMIN D -3) 25 MCG (1000 UT) CAPS Take 1 capsule by mouth daily.   Yes [provider]  clobetasol  cream (TEMOVATE ) 0.05 % Apply to affected areas on hand and back as needed for rash/itching. Avoid applying to face, groin, and axilla. 06/22/24  Yes Claudene Lehmann, MD  clotrimazole  (CLOTRIMAZOLE  ANTI-FUNGAL) 1 % cream Apply 1 Application topically 2 (two)  times daily. 03/04/24  Yes Norrine Sharper, MD  ELIQUIS  5 MG TABS tablet TAKE 1 TABLET TWICE A DAY (CHANGE IN DOSE OR TABLET SIZE) 12/24/23  Yes Rolan Ezra RAMAN, MD  empagliflozin  (JARDIANCE ) 10 MG TABS tablet Take 1 tablet (10 mg total) by mouth daily. PLEASE SCHEDULE APPOINTMENT FOR MORE REFILLS 05/22/24  Yes Bensimhon, Toribio SAUNDERS, MD  ezetimibe  (ZETIA ) 10 MG tablet TAKE 1 TABLET DAILY 12/24/23  Yes McLean, Dalton S, MD  fluticasone  furoate-vilanterol (BREO ELLIPTA ) 100-25 MCG/ACT AEPB Inhale 1 puff into the lungs daily. 03/02/24 02/25/25 Yes Jeanelle Layman CROME, MD  Multiple Vitamins-Minerals (MULTIVITAMIN WITH MINERALS) tablet Take 1 tablet by mouth daily.   Yes [provider]  Pitavastatin  Calcium  4 MG TABS Take 1 tablet (4 mg total) by mouth daily. 03/09/24  Yes Rolan Ezra RAMAN, MD  sacubitril -valsartan  (ENTRESTO ) 97-103 MG Take 1 tablet by mouth 2 (two) times daily. 03/09/24  Yes Rolan Ezra RAMAN, MD  spironolactone  (ALDACTONE ) 25 MG tablet Take 1 tablet (25 mg total) by mouth daily. 05/22/24  Yes Bensimhon, Toribio SAUNDERS, MD  tirzepatide  (MOUNJARO ) 2.5 MG/0.5ML Pen Inject 2.5 mg into the skin once a week. 06/17/24  Yes Rolan Ezra RAMAN, MD  torsemide  (DEMADEX ) 20 MG tablet Take 0.5 tablets (10 mg total) by mouth as needed. Take PRN for weight gain/edema. (3lbs in 1 day or 5 lbs in 1 week) 04/18/23  Yes Rolan Ezra RAMAN, MD  Scheduled Meds:  Chlorhexidine  Gluconate Cloth  6 each Topical Daily   famotidine   20 mg Per Tube BID   heparin   5,000 Units Subcutaneous Q8H   insulin  aspart  0-9 Units Subcutaneous Q4H   ipratropium-albuterol   3 mL Nebulization Q6H   Continuous Infusions:  amiodarone  60 mg/hr (12-Aug-2024 0015)   Followed by   amiodarone      fentaNYL  infusion INTRAVENOUS 100 mcg/hr (08/10/24 2315)   midazolam  2 mg/hr (08/10/24 2315)   norepinephrine  (LEVOPHED ) Adult infusion 40 mcg/min (08/10/24 2318)   sodium bicarbonate  150 mEq in sterile water  1,150 mL infusion     vasopressin       PRN Meds:.fentaNYL , ipratropium-albuterol , midazolam , polyethylene glycol, senna   Critical care time: 55 minutes        Almarie Nose DNP, CCRN, FNP-C, AGACNP-BC Acute Care & Family Nurse Practitioner [facility] PCCM on call pager [phone]         [1]  Allergies Allergen Reactions   Atorvastatin Other (See Comments)    Myalgias   Rosuvastatin  Other (See Comments)    myalgias   "

## 2024-08-10 NOTE — ED Triage Notes (Signed)
 Pt arrived via [facility], post ROSC. EMS report: pt called out earlier in day for EMS for SOB, refused transport. This evening pts neighbor was assisting pt to bathroom when pt went unconscious and fell between sink and toilet, unknown down time. On arrival to scene, EMS found pt to have no pulse and pupils dilated. CPR initiated at 1950, 4 rounds of CPR +2 Epi given before ROSC gained. Pt lost ROSC when arrived to truck, CPR initiated again +1 Epi, ROSC re-gained in route to ED. Pt arrived with pulses present, I-gel in place and I/O access in left lower leg.

## 2024-08-10 NOTE — ED Notes (Signed)
 ICU provider in pts room at this time speaking with cardiologist. [employer] interrogation completed by clinical research associate.

## 2024-08-10 NOTE — ED Notes (Signed)
 Call made to ICU provider to update that pt's defibrillator is continuously shocking the pt while he is intubated.

## 2024-08-11 ENCOUNTER — Inpatient Hospital Stay

## 2024-08-11 ENCOUNTER — Encounter: Payer: Self-pay | Admitting: Oncology

## 2024-08-11 LAB — RESPIRATORY PANEL BY PCR

## 2024-08-11 LAB — RESP PANEL BY RT-PCR (RSV, FLU A&B, COVID)  RVPGX2
Influenza A by PCR: POSITIVE — AB
Influenza B by PCR: NEGATIVE
Resp Syncytial Virus by PCR: NEGATIVE
SARS Coronavirus 2 by RT PCR: NEGATIVE

## 2024-08-11 LAB — MRSA NEXT GEN BY PCR, NASAL: MRSA by PCR Next Gen: NOT DETECTED

## 2024-08-11 LAB — GLUCOSE, CAPILLARY: Glucose-Capillary: 207 mg/dL — ABNORMAL HIGH (ref 70–99)

## 2024-08-11 MED ORDER — ACETAMINOPHEN 650 MG RE SUPP: 650.0000 mg | Freq: Four times a day (QID) | RECTAL | Status: DC | PRN

## 2024-08-11 MED ORDER — DEXTROSE 50 % IV SOLN
50.0000 mL | Freq: Once | INTRAVENOUS | Status: AC
  Administered 2024-08-11: 50 mL via INTRAVENOUS
  Filled 2024-08-11: qty 50

## 2024-08-11 MED ORDER — LEVETIRACETAM (KEPPRA) 500 MG/5 ML ADULT IV PUSH
4500.0000 mg | Freq: Once | INTRAVENOUS | Status: AC
  Administered 2024-08-11: 4500 mg via INTRAVENOUS
  Filled 2024-08-11: qty 45

## 2024-08-11 MED ORDER — LEVETIRACETAM (KEPPRA) 500 MG/5 ML ADULT IV PUSH
500.0000 mg | Freq: Two times a day (BID) | INTRAVENOUS | Status: DC
  Filled 2024-08-11: qty 5

## 2024-08-11 MED ORDER — GLYCOPYRROLATE 0.2 MG/ML IJ SOLN: 0.2000 mg | INTRAMUSCULAR | Status: DC | PRN

## 2024-08-11 MED ORDER — INSULIN ASPART 100 UNIT/ML IV SOLN
10.0000 [IU] | Freq: Once | INTRAVENOUS | Status: AC
  Administered 2024-08-11: 10 [IU] via INTRAVENOUS
  Filled 2024-08-11: qty 10

## 2024-08-11 MED ORDER — FENTANYL 2500MCG IN NS 250ML (10MCG/ML) PREMIX INFUSION
0.0000 ug/h | INTRAVENOUS | Status: DC
  Administered 2024-08-11: 100 ug/h via INTRAVENOUS

## 2024-08-11 MED ORDER — MIDAZOLAM-SODIUM CHLORIDE 100-0.9 MG/100ML-% IV SOLN
0.0000 mg/h | INTRAVENOUS | Status: DC
  Administered 2024-08-11: 2 mg/h via INTRAVENOUS

## 2024-08-11 MED ORDER — MIDAZOLAM HCL (PF) 2 MG/2ML IJ SOLN: 1.0000 mg | INTRAMUSCULAR | Status: DC | PRN

## 2024-08-11 MED ORDER — FENTANYL BOLUS VIA INFUSION: 100.0000 ug | INTRAVENOUS | Status: DC | PRN

## 2024-08-11 MED ORDER — GLYCOPYRROLATE 1 MG PO TABS: 1.0000 mg | ORAL_TABLET | ORAL | Status: DC | PRN

## 2024-08-11 MED ORDER — POLYVINYL ALCOHOL 1.4 % OP SOLN: 1.0000 [drp] | Freq: Four times a day (QID) | OPHTHALMIC | Status: DC | PRN

## 2024-08-11 MED ORDER — MIDAZOLAM BOLUS VIA INFUSION (WITHDRAWAL LIFE SUSTAINING TX)
2.0000 mg | INTRAVENOUS | Status: DC | PRN
  Administered 2024-08-11: 2 mg via INTRAVENOUS

## 2024-08-11 MED ORDER — SODIUM CHLORIDE 0.9 % IV SOLN: INTRAVENOUS | Status: DC

## 2024-08-11 MED ORDER — STERILE WATER FOR INJECTION IV SOLN
INTRAVENOUS | Status: DC
  Filled 2024-08-11 (×2): qty 1000

## 2024-08-11 MED ORDER — SODIUM CHLORIDE 0.9 % IV SOLN
1.0000 g | INTRAVENOUS | Status: DC
  Filled 2024-08-11: qty 10

## 2024-08-11 MED ORDER — ACETAMINOPHEN 325 MG PO TABS: 650.0000 mg | ORAL_TABLET | Freq: Four times a day (QID) | ORAL | Status: DC | PRN

## 2024-08-11 MED ORDER — SODIUM BICARBONATE 8.4 % IV SOLN
50.0000 meq | Freq: Once | INTRAVENOUS | Status: AC
  Administered 2024-08-11: 50 meq via INTRAVENOUS
  Filled 2024-08-11: qty 50

## 2024-08-11 MED ORDER — SODIUM CHLORIDE 0.9 % IV SOLN
500.0000 mg | INTRAVENOUS | Status: DC
  Filled 2024-08-11: qty 5

## 2024-08-17 ENCOUNTER — Ambulatory Visit: Admitting: Dermatology

## 2024-08-19 ENCOUNTER — Ambulatory Visit: Payer: Medicare Other

## 2024-09-06 NOTE — Progress Notes (Signed)
 Transported patient to CT and then up to ICU with no events.

## 2024-09-06 NOTE — IPAL (Addendum)
 INTERDISCIPLINARY GOALS OF CARE FAMILY MEETING  Date carried out: 08/17/24  Location of the meeting: Bedside (ICU) and via telephone  Member's involved: Critical Care NP; patient's ex-wife (at bedside); patient's son and daughter Emmie (via phone)  Durable Power of Insurance risk surveyor: No formal Engineer, Agricultural on file. Medical decision-making performed by next of kin, including patient's ex-wife, son, and daughter.  Discussion: Advance Care Planning / Goals of Care discussion was performed during the course of treatment to determine the most appropriate level of care for this patient following ICU admission.  I met with the patient's ex-wife at the bedside and spoke with the patient's son and daughter Emmie over the phone to discuss goals of care in detail following a significant decline in the patient's clinical status. The patient was unable to participate in the discussion due to critical illness, intubation, and altered mental status.  We reviewed the patient's worsening laboratory values and ABGs demonstrating severe mixed metabolic and respiratory acidosis, unstable vital signs including labile heart rate and hypotension requiring multiple vasopressors, and overall hemodynamic instability. We discussed CT head findings showing concern for early hypoxic-ischemic brain injury, as well as a poor neurological examination with post-hypoxic myoclonic jerking movements. 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  Prognosis, expected outcomes with continued aggressive medical therapy versus limitation of life-sustaining interventions, and options for de-escalation of care were discussed thoroughly. Time was provided for questions, and all questions were  answered.  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  Disposition: ICU  Next Steps: Family verbalized understanding of the patient's condition and prognosis. They have consented and agreed to DNR/DNI status, including turning off the ICD and transitioning to full comfort care. see separate note. Care will continue in alignment with patient and family wishes, with focus on comfort and goal-concordant treatment. Ongoing reassessment and communication with family will continue.  Family expressed understanding of the plan of care and satisfaction with management. All questions were answered.  Total Time Spent Face to Face addressing advance care planning in the presence of the Patient: 35 minutes     Almarie Nose DNP, CCRN, FNP-C, AGACNP-BC Acute Care & Family Nurse Practitioner [facility] PCCM on call pager [phone]

## 2024-09-06 NOTE — Progress Notes (Signed)
 [name]. [name] at bedside at time of passing. Advised [name] that this RN would notify patient's son and daughter. She stated that she would notify his children. Silver colored necklace w/ silver dangling emblem removed from patient and handed to [name]. Funeral home not known at this time. Patient placement number provided.   Pt's clothing had been cut off and large BM noted upon arrival to the unit. [name] was notified of this and advised that clothing had been thrown away. Understanding voiced.

## 2024-09-06 NOTE — Death Summary Note (Addendum)
 " DEATH SUMMARY   Patient Details  Name: Paul Bullock MRN: [id number] DOB: Dec 09, 1945  Admission/Discharge Information   Admit Date:  2024-08-19  Date of Death: Date of Death: Aug 20, 2024  Time of Death: Time of Death: 0352  Length of Stay: 1  Referring Physician: Shawn Sick, MD   Reason(s) for Hospitalization  Out of hospital PEA Cardiac Arrest  Diagnoses  Preliminary cause of death: Cardiopulmonary arrest due to advanced ischemic cardiomyopathy with chronic systolic heart failure, complicated by recurrent out-of-hospital PEA cardiac arrest, multiorgan failure, and hypoxic-ischemic brain injury. Secondary Diagnoses (including complications and co-morbidities):  Principal Problem:   Cardiac arrest with successful resuscitation [facility])   Brief Hospital Course (including significant findings, care, treatment, and services provided and events leading to death)  Paul Bullock is a 79 y.o. year old male  with a history of severe ischemic cardiomyopathy (EF 25-30%, ICD in place), CAD with prior MI and LAD stents, COPD (active smoker), prior DVT/PE, chronic thrombocytopenia, polycythemia, T2DM, and hyperlipidemia, presented on 2024/08/19 following an out-of-hospital PEA cardiac arrest with unknown downtime. Multiple episodes of cardiac arrest occurred with recurrent ROSC pre-hospital and in the ED.  In the ED, the patient required intubation, vasopressors, and aggressive resuscitation. Labs revealed mixed severe metabolic and respiratory acidosis, hyperkalemia, acute kidney injury, and transaminitis. CT head was concerning for early diffuse hypoxic-ischemic injury, and CT chest/abdomen/pelvis showed rib and sternal fractures from CPR but no acute reversible pathology.  Despite maximal ICU support, the patient remained hemodynamically unstable with escalating vasopressor requirements and poor neurological exam, including post-hypoxic myoclonic movements. Prognosis was extremely poor due to advanced  cardiac disease, multiorgan failure, and hypoxic brain injury. An interdisciplinary Goals of Care discussion was held with the family on Aug 20, 2024. The family elected comfort-focused care and consented to DNR/DNI status and ICD deactivation. The patient was terminally extubated at 03:20, per family wishes, and passed away peacefully at 03:52.  Pertinent Labs and Studies  Significant Diagnostic Studies CT CHEST ABDOMEN PELVIS WO CONTRAST Result Date: 2024-08-20 CLINICAL DATA:  Unknown down time, cardiac arrest EXAM: CT CHEST, ABDOMEN AND PELVIS WITHOUT CONTRAST TECHNIQUE: Multidetector CT imaging of the chest, abdomen and pelvis was performed following the standard protocol without IV contrast. RADIATION DOSE REDUCTION: This exam was performed according to the departmental dose-optimization program which includes automated exposure control, adjustment of the mA and/or kV according to patient size and/or use of iterative reconstruction technique. COMPARISON:  Radiograph Aug 19, 2024, CT 05/11/2022 FINDINGS: CT CHEST FINDINGS Cardiovascular: Limited assessment without intravenous contrast. Advanced aortic atherosclerosis. Partially visualized cardiac pacing device. Cardiomegaly. No pericardial effusion. Multi-vessel coronary vascular calcification. Mediastinum/Nodes: Endotracheal tube tip several cm superior to carina. No suspicious lymph nodes. No thyroid mass. Enteric tube tip terminates in the GE junction region. Lungs/Pleura: No acute airspace disease, pleural effusion, or pneumothorax. Mild bilateral bronchial wall thickening. Atelectasis or scarring at the bases. Musculoskeletal: Probable nondisplaced fracture involving the upper sternum. Multiple acute bilateral anterior rib fractures. CT ABDOMEN PELVIS FINDINGS Hepatobiliary: No focal liver abnormality is seen. No gallstones, gallbladder wall thickening, or biliary dilatation. Pancreas: Unremarkable. No pancreatic ductal dilatation or surrounding  inflammatory changes. Spleen: Normal in size without focal abnormality. Adrenals/Urinary Tract: Adrenal glands are normal. Kidneys show no hydronephrosis. Nonspecific perinephric fat stranding. Indeterminate slightly hyperdense 19 mm midpole lesion on the left, series 2, image 78. Bladder contains Foley catheter and is decompressed Stomach/Bowel: Enteric tube tip in the GE junction region. Moderate air distension of stomach. No dilated small bowel. No acute bowel wall thickening.  Negative appendix. Linear metallic small object in the right colon. Mild diverticular disease of the left colon. Vascular/Lymphatic: Aortic atherosclerosis. No enlarged abdominal or pelvic lymph nodes. Reproductive: Prostate is unremarkable. Other: No ascites. No free air. Small fat containing inguinal hernias. Musculoskeletal: No acute osseous abnormality IMPRESSION: 1. No CT evidence for acute intrathoracic, intra-abdominal, or intrapelvic abnormality. 2. Probable nondisplaced fracture involving the upper sternum. Multiple acute bilateral anterior rib fractures. No evidence of a pneumothorax. 3. High-riding enteric tube tip in the region of GE junction, suggest further advancement for more optimal positioning. 4. Indeterminate 19 mm slightly hyperdense midpole lesion on the left. Consider initial evaluation with nonemergent renal ultrasound. 5. 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 Aortic Atherosclerosis (ICD10-I70.0). Electronically Signed   By: Luke Bun M.D.   On: 09-05-24 00:18   CT Head Wo Contrast Result Date: 09/05/2024 CLINICAL DATA:  Cardiac arrest unclear downtime EXAM: CT HEAD WITHOUT CONTRAST TECHNIQUE: Contiguous axial images were obtained from the base of the skull through the vertex without intravenous contrast. RADIATION DOSE REDUCTION: This exam was performed according to the departmental dose-optimization program which includes automated exposure control, adjustment of the mA and/or kV  according to patient size and/or use of iterative reconstruction technique. COMPARISON:  None Available. FINDINGS: Brain: No acute hemorrhage or intracranial mass. Suspect that there may be early or subtle loss of gray-white matter differentiation. The ventricles are nonenlarged. No midline shift. Vascular: Generalized increased density of the venous sinuses and intracranial vessels Skull: No fracture Sinuses/Orbits: Mucosal thickening in the sinuses. Fluid within the nasopharynx. Other: None IMPRESSION: 1. Negative for acute intracranial hemorrhage. 2. Suspect that there may be early or subtle diffuse loss of gray-white matter differentiation, raising concern for early changes of diffuse hypoxic ischemic injury. Electronically Signed   By: Luke Bun M.D.   On: September 05, 2024 00:10   DG Abd Portable 1 View Result Date: 08/10/2024 EXAM: 1 VIEW XRAY OF THE ABDOMEN 08/10/2024 11:17:00 PM COMPARISON: Comparison 08/10/2024. CLINICAL HISTORY: POST OG FINDINGS: LINES, TUBES AND DEVICES: OG tube tip is at the GE junction, slightly retracted since the prior study. BOWEL: Gaseous distention of the stomach. SOFT TISSUES: No abnormal calcifications. BONES: No acute fracture. IMPRESSION: 1. NG tube tip at the GE junction, slightly retracted compared with the prior study. Recommend advancing several cm into the stomach. 2. Gaseous distention of the stomach. Electronically signed by: Franky Crease MD 08/10/2024 11:28 PM EST RP Workstation: [id number]   DG Chest Port 1 View Result Date: 08/10/2024 EXAM: 1 VIEW(S) XRAY OF THE CHEST 08/10/2024 09:02:00 PM COMPARISON: None available. CLINICAL HISTORY: Intubation of airway performed without difficulty. FINDINGS: LINES, TUBES AND DEVICES: Endotracheal tube in place with tip 3.5 cm above the carina. Enteric tube in place with tip below the diaphragm overlying the expected region of the gastric lumen. Left chest wall ICD noted. LUNGS AND PLEURA: No focal pulmonary opacity. No pleural  effusion. No pneumothorax. HEART AND MEDIASTINUM: Cardiomegaly. Atherosclerotic plaque noted. BONES AND SOFT TISSUES: No acute osseous abnormality. STOMACH AND BOWEL: Gaseous distension of stomach. IMPRESSION: 1. Endotracheal tube and enteric tube in appropriate positions. 2. No acute cardiopulmonary findings. 3. Gaseous distension of the stomach. Electronically signed by: Elsie Gravely MD 08/10/2024 09:10 PM EST RP Workstation: [id number]   DG Abd Portable 1 View Result Date: 08/10/2024 EXAM: 1 VIEW XRAY OF THE ABDOMEN 08/10/2024 09:02:00 PM COMPARISON: None available. CLINICAL HISTORY: POST OG FINDINGS: LINES, TUBES AND DEVICES: Enteric tube in place with tip at the proximal  stomach. Cardiac pad noted overlying left base. BOWEL: Gas distended stomach. SOFT TISSUES: No abnormal calcifications. BONES: No acute fracture. HEART: Cardiomegaly. IMPRESSION: 1. Enteric tube tip projects over the proximal stomach. 2. Gas distended stomach. Electronically signed by: Elsie Gravely MD 08/10/2024 09:08 PM EST RP Workstation: [id number]    Microbiology Recent Results (from the past 240 hours)  MRSA Next Gen by PCR, Nasal     Status: None   Collection Time: 09/09/24 12:19 AM   Specimen: Nasal Mucosa; Nasal Swab  Result Value Ref Range Status   MRSA by PCR Next Gen NOT DETECTED NOT DETECTED Final    Comment: (NOTE) The GeneXpert MRSA Assay (FDA approved for NASAL specimens only), is one component of a comprehensive MRSA colonization surveillance program. It is not intended to diagnose MRSA infection nor to guide or monitor treatment for MRSA infections. Test performance is not FDA approved in patients less than 72 years old. Performed at [facility], [address] [place], [place] [address]   Resp panel by RT-PCR (RSV, Flu A&B, Covid) Anterior Nasal Swab     Status: Abnormal   Collection Time: 09-09-2024 12:19 AM   Specimen: Anterior Nasal Swab  Result Value Ref Range Status   SARS  Coronavirus 2 by RT PCR NEGATIVE NEGATIVE Final    Comment: (NOTE) SARS-CoV-2 target nucleic acids are NOT DETECTED.  The SARS-CoV-2 RNA is generally detectable in upper respiratory specimens during the acute phase of infection. The lowest concentration of SARS-CoV-2 viral copies this assay can detect is 138 copies/mL. A negative result does not preclude SARS-Cov-2 infection and should not be used as the sole basis for treatment or other patient management decisions. A negative result may occur with  improper specimen collection/handling, submission of specimen other than nasopharyngeal swab, presence of viral mutation(s) within the areas targeted by this assay, and inadequate number of viral copies(<138 copies/mL). A negative result must be combined with clinical observations, patient history, and epidemiological information. The expected result is Negative.  Fact Sheet for Patients:  bloggercourse.com  Fact Sheet for Healthcare Providers:  seriousbroker.it  This test is no t yet approved or cleared by the [place]  FDA and  has been authorized for detection and/or diagnosis of SARS-CoV-2 by FDA under an Emergency Use Authorization (EUA). This EUA will remain  in effect (meaning this test can be used) for the duration of the COVID-19 declaration under Section 564(b)(1) of the Act, 21 U.S.C.section 360bbb-3(b)(1), unless the authorization is terminated  or revoked sooner.       Influenza A by PCR POSITIVE (A) NEGATIVE Final   Influenza B by PCR NEGATIVE NEGATIVE Final    Comment: (NOTE) The Xpert Xpress SARS-CoV-2/FLU/RSV plus assay is intended as an aid in the diagnosis of influenza from Nasopharyngeal swab specimens and should not be used as a sole basis for treatment. Nasal washings and aspirates are unacceptable for Xpert Xpress SARS-CoV-2/FLU/RSV testing.  Fact Sheet for  Patients: bloggercourse.com  Fact Sheet for Healthcare Providers: seriousbroker.it  This test is not yet approved or cleared by the [place]  FDA and has been authorized for detection and/or diagnosis of SARS-CoV-2 by FDA under an Emergency Use Authorization (EUA). This EUA will remain in effect (meaning this test can be used) for the duration of the COVID-19 declaration under Section 564(b)(1) of the Act, 21 U.S.C. section 360bbb-3(b)(1), unless the authorization is terminated or revoked.     Resp Syncytial Virus by PCR NEGATIVE NEGATIVE Final    Comment: (NOTE) Fact  Sheet for Patients: bloggercourse.com  Fact Sheet for Healthcare Providers: seriousbroker.it  This test is not yet approved or cleared by the [place]  FDA and has been authorized for detection and/or diagnosis of SARS-CoV-2 by FDA under an Emergency Use Authorization (EUA). This EUA will remain in effect (meaning this test can be used) for the duration of the COVID-19 declaration under Section 564(b)(1) of the Act, 21 U.S.C. section 360bbb-3(b)(1), unless the authorization is terminated or revoked.  Performed at [facility], [address] [place], [place] [address]     Lab Basic Metabolic Panel: Recent Labs  Lab 08/10/24 2104  NA 141  139  K 5.9*  5.8*  CL 98  98  CO2 17*  12*  GLUCOSE 182*  181*  BUN 31*  32*  CREATININE 2.79*  2.84*  CALCIUM  9.5  9.1   Liver Function Tests: Recent Labs  Lab 08/10/24 2104  AST 162*  ALT 87*  ALKPHOS 105  BILITOT 1.7*  PROT 6.9  ALBUMIN 3.9   No results for input(s): LIPASE, AMYLASE in the last 168 hours. No results for input(s): AMMONIA in the last 168 hours. CBC: Recent Labs  Lab 08/10/24 2104  WBC 15.0*  HGB 18.1*  HCT 58.5*  MCV 103.9*  PLT 91*   Cardiac Enzymes: No results for input(s): CKTOTAL, CKMB,  CKMBINDEX, TROPONINI in the last 168 hours. Sepsis Labs: Recent Labs  Lab 08/10/24 2104  PROCALCITON 1.48  WBC 15.0*    Procedures/Operations  See procedure notes     Almarie Nose DNP, CCRN, FNP-C, AGACNP-BC Acute Care & Family Nurse Practitioner [place] [facility] PCCM on call pager [phone]    "

## 2024-09-06 NOTE — Progress Notes (Signed)
 Extubated pt to RA for comfort care per NP order. RN and family at bedside. No issues or concerns to report. Rt will assist as needed.

## 2024-09-06 NOTE — Progress Notes (Signed)
 eLink Physician-Brief Progress Note Patient Name: Paul Bullock DOB: [birth year] MRN: [id number]   Date of Service  2024-08-31  HPI/Events of Note  79 year old male with a history of metabolic syndrome, COPD, heart failure with reduced EF, CKD and morbid obesity who presents as an out-of-hospital cardiac arrest in PEA with apnea.  Mechanically ventilated in shock.  Vitals, labs, and imaging reviewed.  Currently running sedation, analgesia, amiodarone , and norepinephrine .  Results consistent with severe mixed acidosis, hyperkalemia, acute kidney injury and liver injury, polycythemia and leukocytosis.  Early evidence of diffuse anoxic brain injury.  Anterior rib fracture present.  eICU Interventions  Advance gastric tube then okay to use  Maintain vasopressors for goal MAP greater than 65  Initiate bicarb infusion, goal pH greater than 7.25  Mechanical ventilation, SAT/SBT  Neuro prognostication with early evidence of diffuse anoxic brain injury.  DNR in place.  DVT prophylaxis with heparin  GI prophylaxis with famotidine       Intervention Category Evaluation Type: New Patient Evaluation  Baani Bober 08/31/2024, 12:31 AM

## 2024-09-06 NOTE — Progress Notes (Addendum)
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 Goals of care discussion held with family, and decision made to transition to DNR/DNI status with limitation of life-sustaining therapies.  DISCUSSION AND CONSENT Following a detailed Goals of Care / Advance Care Planning discussion, the patient's acting medical decision makers (ex-wife at bedside, son and daughter Emmie via phone) demonstrated understanding of the patient's condition and prognosis. They consented to deactivation of ICD defibrillation therapies in alignment with the patient's goals of care. It was explained that: Deactivation will disable defibrillation and anti-tachycardia therapies Pacing function will be maintained, if applicable, unless otherwise specified ICD deactivation does not cause death, but prevents painful shocks at end of life Family verbalized understanding and agreement.  ORDERS Deactivate ICD defibrillation and anti-tachycardia therapies Maintain pacing function (if present) Cardiology Dr. Okey was contacted who assisted with communication with [employer] / device representative for deactivation Deactivation completed by [employer] representative on 09-08-2024 @0228   CODE STATUS DNR/DNI  PLAN Proceed with ICD deactivation per family wishes Continue care consistent with goals of comfort and goal-concordant treatment Ongoing communication with family      Almarie Nose DNP, CCRN, FNP-C, AGACNP-BC Acute Care & Family Nurse Practitioner [place] [facility] PCCM on call pager  [phone]   "

## 2024-09-06 DEATH — deceased

## 2024-09-09 ENCOUNTER — Inpatient Hospital Stay: Admitting: Nurse Practitioner

## 2024-09-09 ENCOUNTER — Inpatient Hospital Stay
